# Patient Record
Sex: Male | Born: 1965 | Race: White | Hispanic: No | Marital: Married | Smoking: Never smoker
Health system: Southern US, Community
[De-identification: ages and names within clinical notes are randomized; demographics above are authoritative.]

## PROBLEM LIST (undated history)

## (undated) DIAGNOSIS — K219 Gastro-esophageal reflux disease without esophagitis: Secondary | ICD-10-CM

## (undated) DIAGNOSIS — M79641 Pain in right hand: Secondary | ICD-10-CM

## (undated) DIAGNOSIS — G4733 Obstructive sleep apnea (adult) (pediatric): Secondary | ICD-10-CM

## (undated) DIAGNOSIS — Z8616 Personal history of COVID-19: Secondary | ICD-10-CM

## (undated) DIAGNOSIS — M79642 Pain in left hand: Secondary | ICD-10-CM

## (undated) DIAGNOSIS — E349 Endocrine disorder, unspecified: Secondary | ICD-10-CM

## (undated) DIAGNOSIS — J310 Chronic rhinitis: Secondary | ICD-10-CM

## (undated) DIAGNOSIS — R9389 Abnormal findings on diagnostic imaging of other specified body structures: Secondary | ICD-10-CM

## (undated) DIAGNOSIS — G629 Polyneuropathy, unspecified: Secondary | ICD-10-CM

## (undated) DIAGNOSIS — Q263 Partial anomalous pulmonary venous connection: Secondary | ICD-10-CM

## (undated) DIAGNOSIS — E782 Mixed hyperlipidemia: Secondary | ICD-10-CM

## (undated) HISTORY — DX: Mixed hyperlipidemia: E78.2

## (undated) HISTORY — DX: Polyneuropathy, unspecified: G62.9

## (undated) HISTORY — DX: Pain in left hand: M79.641

## (undated) HISTORY — DX: Pain in right hand: M79.642

## (undated) HISTORY — DX: Obstructive sleep apnea (adult) (pediatric): G47.33

## (undated) HISTORY — PX: HERNIA REPAIR: SHX51

## (undated) HISTORY — DX: Chronic rhinitis: J31.0

## (undated) HISTORY — DX: Partial anomalous pulmonary venous connection: Q26.3

## (undated) HISTORY — DX: Personal history of COVID-19: Z86.16

## (undated) HISTORY — DX: Endocrine disorder, unspecified: E34.9

---

## 2019-08-23 ENCOUNTER — Other Ambulatory Visit: Payer: Self-pay

## 2019-08-23 ENCOUNTER — Encounter (HOSPITAL_BASED_OUTPATIENT_CLINIC_OR_DEPARTMENT_OTHER): Payer: Self-pay | Admitting: *Deleted

## 2019-08-23 ENCOUNTER — Emergency Department (HOSPITAL_COMMUNITY): Admission: EM | Admit: 2019-08-23 | Discharge: 2019-08-23 | Discharge status: Against Medical Advice | Payer: Self-pay

## 2019-08-23 DIAGNOSIS — U071 COVID-19: Secondary | ICD-10-CM | POA: Diagnosis not present

## 2019-08-23 DIAGNOSIS — F410 Panic disorder [episodic paroxysmal anxiety] without agoraphobia: Secondary | ICD-10-CM | POA: Diagnosis present

## 2019-08-23 DIAGNOSIS — J9601 Acute respiratory failure with hypoxia: Secondary | ICD-10-CM | POA: Diagnosis present

## 2019-08-23 DIAGNOSIS — K219 Gastro-esophageal reflux disease without esophagitis: Secondary | ICD-10-CM | POA: Diagnosis present

## 2019-08-23 DIAGNOSIS — Z801 Family history of malignant neoplasm of trachea, bronchus and lung: Secondary | ICD-10-CM

## 2019-08-23 DIAGNOSIS — J1282 Pneumonia due to coronavirus disease 2019: Secondary | ICD-10-CM | POA: Diagnosis present

## 2019-08-23 DIAGNOSIS — R1110 Vomiting, unspecified: Secondary | ICD-10-CM | POA: Diagnosis present

## 2019-08-23 DIAGNOSIS — E222 Syndrome of inappropriate secretion of antidiuretic hormone: Secondary | ICD-10-CM | POA: Diagnosis present

## 2019-08-23 NOTE — ED Triage Notes (Addendum)
Pt reports he tested COVID + on Sunday. Pt states since Friday night he has had a fever, vomiting, and cough since Friday night. Taking prednisone and zpack. Pt says he went to UC in [place] and he has bilateral PNA and his O2 level was 88% on RA.

## 2019-08-24 ENCOUNTER — Inpatient Hospital Stay (HOSPITAL_BASED_OUTPATIENT_CLINIC_OR_DEPARTMENT_OTHER)
Admission: EM | Admit: 2019-08-24 | Discharge: 2019-09-09 | DRG: 177 | Disposition: A | Discharge status: Home / Self Care | Payer: 59 | Attending: Internal Medicine | Admitting: Internal Medicine

## 2019-08-24 ENCOUNTER — Emergency Department (HOSPITAL_BASED_OUTPATIENT_CLINIC_OR_DEPARTMENT_OTHER): Payer: 59

## 2019-08-24 DIAGNOSIS — R748 Abnormal levels of other serum enzymes: Secondary | ICD-10-CM

## 2019-08-24 DIAGNOSIS — R0902 Hypoxemia: Secondary | ICD-10-CM

## 2019-08-24 DIAGNOSIS — J069 Acute upper respiratory infection, unspecified: Secondary | ICD-10-CM

## 2019-08-24 DIAGNOSIS — J1282 Pneumonia due to coronavirus disease 2019: Secondary | ICD-10-CM

## 2019-08-24 DIAGNOSIS — R0600 Dyspnea, unspecified: Secondary | ICD-10-CM

## 2019-08-24 DIAGNOSIS — R0602 Shortness of breath: Secondary | ICD-10-CM

## 2019-08-24 HISTORY — DX: Acute upper respiratory infection, unspecified: J06.9

## 2019-08-24 LAB — CBC WITH DIFFERENTIAL/PLATELET
Abs Immature Granulocytes: 0.02 10*3/uL (ref 0.00–0.07)
Basophils Absolute: 0 10*3/uL (ref 0.0–0.1)
Basophils Relative: 0 %
Eosinophils Absolute: 0 10*3/uL (ref 0.0–0.5)
Eosinophils Relative: 0 %
HCT: 45.3 % (ref 39.0–52.0)
Hemoglobin: 15.1 g/dL (ref 13.0–17.0)
Immature Granulocytes: 0 %
Lymphocytes Relative: 8 %
Lymphs Abs: 0.5 10*3/uL — ABNORMAL LOW (ref 0.7–4.0)
MCH: 31.9 pg (ref 26.0–34.0)
MCHC: 33.3 g/dL (ref 30.0–36.0)
MCV: 95.8 fL (ref 80.0–100.0)
Monocytes Absolute: 0.2 10*3/uL (ref 0.1–1.0)
Monocytes Relative: 3 %
Neutro Abs: 5.1 10*3/uL (ref 1.7–7.7)
Neutrophils Relative %: 89 %
Platelets: 151 10*3/uL (ref 150–400)
RBC: 4.73 MIL/uL (ref 4.22–5.81)
RDW: 12.4 % (ref 11.5–15.5)
WBC: 5.8 10*3/uL (ref 4.0–10.5)
nRBC: 0 % (ref 0.0–0.2)

## 2019-08-24 LAB — BASIC METABOLIC PANEL
Anion gap: 11 (ref 5–15)
BUN: 11 mg/dL (ref 6–20)
CO2: 26 mmol/L (ref 22–32)
Calcium: 8.4 mg/dL — ABNORMAL LOW (ref 8.9–10.3)
Chloride: 98 mmol/L (ref 98–111)
Creatinine, Ser: 1.25 mg/dL — ABNORMAL HIGH (ref 0.61–1.24)
GFR calc Af Amer: >60 mL/min (ref >60)
GFR calc non Af Amer: >60 mL/min (ref >60)
Glucose, Bld: 121 mg/dL — ABNORMAL HIGH (ref 70–99)
Potassium: 4 mmol/L (ref 3.5–5.1)
Sodium: 135 mmol/L (ref 135–145)

## 2019-08-24 LAB — SARS CORONAVIRUS 2 BY RT PCR (HOSPITAL ORDER, PERFORMED IN ~~LOC~~ HOSPITAL LAB): SARS Coronavirus 2: POSITIVE — AB

## 2019-08-24 MED ORDER — ACETAMINOPHEN 325 MG PO TABS
650.0000 mg | ORAL_TABLET | Freq: Once | ORAL | Status: AC | PRN
  Administered 2019-08-24: 650 mg via ORAL
  Filled 2019-08-24: qty 2

## 2019-08-24 MED ORDER — KETOROLAC TROMETHAMINE 30 MG/ML IJ SOLN
30.0000 mg | Freq: Once | INTRAMUSCULAR | Status: DC
  Filled 2019-08-24: qty 1

## 2019-08-24 MED ORDER — SODIUM CHLORIDE 0.9 % IV SOLN
100.0000 mg | Freq: Every day | INTRAVENOUS | Status: AC
  Administered 2019-08-25 – 2019-08-28 (×4): 100 mg via INTRAVENOUS
  Filled 2019-08-24 (×4): qty 20

## 2019-08-24 MED ORDER — DEXAMETHASONE SODIUM PHOSPHATE 10 MG/ML IJ SOLN
10.0000 mg | Freq: Once | INTRAMUSCULAR | Status: AC
  Administered 2019-08-24: 10 mg via INTRAVENOUS
  Filled 2019-08-24: qty 1

## 2019-08-24 MED ORDER — SODIUM CHLORIDE 0.9 % IV SOLN
100.0000 mg | INTRAVENOUS | Status: AC
  Administered 2019-08-24 (×2): 100 mg via INTRAVENOUS
  Filled 2019-08-24 (×2): qty 20

## 2019-08-24 MED ORDER — ACETAMINOPHEN 325 MG PO TABS
650.0000 mg | ORAL_TABLET | Freq: Four times a day (QID) | ORAL | Status: DC | PRN
  Administered 2019-08-25 – 2019-09-01 (×7): 650 mg via ORAL
  Filled 2019-08-24 (×9): qty 2

## 2019-08-24 MED ORDER — KETOROLAC TROMETHAMINE 30 MG/ML IJ SOLN
15.0000 mg | Freq: Once | INTRAMUSCULAR | Status: AC
  Administered 2019-08-24: 15 mg via INTRAVENOUS

## 2019-08-24 NOTE — ED Provider Notes (Signed)
[facility] Provider Note: [name], MD, FACEP  CSN: [id number] MRN: [id number] ARRIVAL: 08/23/19 at 2339 ROOM: MH08/MH08   CHIEF COMPLAINT  Cough   HISTORY OF PRESENT ILLNESS  08/24/19 2:55 AM Samuel [name] is a 54 y.o. male with a one week history of persistent fever, body aches, cough, posttussive emesis and shortness of breath.  He tested positive for Covid 2 days later.  He has been taking Tessalon Perles, prednisone and azithromycin.  Despite these his symptoms worsened yesterday and shortness of breath has become moderate.  It is worse with exertion.  He is developed a headache and abdominal pain which he attributes to the force of his persistent coughing.  He has had nasal congestion, sore throat, and loss of taste and smell.  He was seen at an urgent care yesterday and was noted to have bilateral pneumonia and an oxygen saturation of 88%.  His temperature on arrival here was 101.1 and he was given acetaminophen.   History reviewed. No pertinent past medical history.  Past Surgical History:  Procedure Laterality Date  . HERNIA REPAIR      No family history on file.  Social History   Tobacco Use  . Smoking status: Never Smoker  Substance Use Topics  . Alcohol use: Not Currently  . Drug use: Not Currently    Prior to Admission medications   Not on File    Allergies Patient has no known allergies.   REVIEW OF SYSTEMS  Negative except as noted here or in the History of Present Illness.   PHYSICAL EXAMINATION  Initial Vital Signs Blood pressure (!) 132/92, pulse 92, temperature (!) 101.1 F (38.4 C), temperature source Oral, resp. rate 16, SpO2 93 %.  Examination General: Well-developed, well-nourished male in no acute distress; appearance consistent with age of record HENT: normocephalic; atraumatic Eyes: pupils equal, round and reactive to light; extraocular muscles intact Neck: supple Heart: regular rate and rhythm Lungs:  Faint rales in bases; shallow breaths with coughing on deep breathing Abdomen: soft; nondistended; mild diffuse tenderness; bowel sounds present Extremities: No deformity; full range of motion; pulses normal Neurologic: Awake, alert and oriented; motor function intact in all extremities and symmetric; no facial droop Skin: Warm and dry Psychiatric: Normal mood and affect   RESULTS  Summary of this visit's results, reviewed and interpreted by myself:   EKG Interpretation  Date/Time:    Ventricular Rate:    PR Interval:    QRS Duration:   QT Interval:    QTC Calculation:   R Axis:     Text Interpretation:        Laboratory Studies: Results for orders placed or performed during the hospital encounter of 08/24/19 (from the past 24 hour(s))  SARS Coronavirus 2 by RT PCR (hospital order, performed in [facility] hospital lab) Nasopharyngeal Nasopharyngeal Swab     Status: Abnormal   Collection Time: 08/24/19 12:10 AM   Specimen: Nasopharyngeal Swab  Result Value Ref Range   SARS Coronavirus 2 POSITIVE (A) NEGATIVE  CBC with Differential/Platelet     Status: Abnormal   Collection Time: 08/24/19  3:16 AM  Result Value Ref Range   WBC 5.8 4.0 - 10.5 K/uL   RBC 4.73 4.22 - 5.81 MIL/uL   Hemoglobin 15.1 13.0 - 17.0 g/dL   HCT 69.4 39 - 52 %   MCV 95.8 80.0 - 100.0 fL   MCH 31.9 26.0 - 34.0 pg   MCHC 33.3 30.0 - 36.0 g/dL  RDW 12.4 11.5 - 15.5 %   Platelets 151 150 - 400 K/uL   nRBC 0.0 0.0 - 0.2 %   Neutrophils Relative % 89 %   Neutro Abs 5.1 1.7 - 7.7 K/uL   Lymphocytes Relative 8 %   Lymphs Abs 0.5 (L) 0.7 - 4.0 K/uL   Monocytes Relative 3 %   Monocytes Absolute 0.2 0 - 1 K/uL   Eosinophils Relative 0 %   Eosinophils Absolute 0.0 0 - 0 K/uL   Basophils Relative 0 %   Basophils Absolute 0.0 0 - 0 K/uL   Immature Granulocytes 0 %   Abs Immature Granulocytes 0.02 0.00 - 0.07 K/uL  Basic metabolic panel     Status: Abnormal   Collection Time: 08/24/19  3:16 AM  Result  Value Ref Range   Sodium 135 135 - 145 mmol/L   Potassium 4.0 3.5 - 5.1 mmol/L   Chloride 98 98 - 111 mmol/L   CO2 26 22 - 32 mmol/L   Glucose, Bld 121 (H) 70 - 99 mg/dL   BUN 11 6 - 20 mg/dL   Creatinine, Ser 9.93 (H) 0.61 - 1.24 mg/dL   Calcium 8.4 (L) 8.9 - 10.3 mg/dL   GFR calc non Af Amer >60 >60 mL/min   GFR calc Af Amer >60 >60 mL/min   Anion gap 11 5 - 15   Imaging Studies: DG Chest Portable 1 View  Result Date: 08/24/2019 CLINICAL DATA:  Cough fever COVID positive EXAM: PORTABLE CHEST 1 VIEW COMPARISON:  None. FINDINGS: Streaky bilateral pulmonary opacities. Normal heart size. No pneumothorax or pleural effusion IMPRESSION: Streaky bilateral pulmonary opacities suspicious for bilateral pneumonia. Electronically Signed   By: Jasmine Pang M.D.   On: 08/24/2019 00:41    ED COURSE and MDM  Nursing notes, initial and subsequent vitals signs, including pulse oximetry, reviewed and interpreted by myself.  Vitals:   08/23/19 2352 08/23/19 2358 08/24/19 0152 08/24/19 0321  BP: (!) 142/106  (!) 132/92   Pulse: (!) 108  92   Resp: (!) 24  16   Temp: (!) 101.5 F (38.6 C)  (!) 101.1 F (38.4 C) (!) 100.9 F (38.3 C)  TempSrc: Oral  Oral Oral  SpO2: 90% 96% 93%    Medications  dexamethasone (DECADRON) injection 10 mg (has no administration in time range)  remdesivir 100 mg in sodium chloride 0.9 % 100 mL IVPB (has no administration in time range)  remdesivir 100 mg in sodium chloride 0.9 % 100 mL IVPB (has no administration in time range)  acetaminophen (TYLENOL) tablet 650 mg (650 mg Oral Given 08/24/19 0015)   3:42 AM Patient started on dexamethasone at remdesivir IV.  Oxygen saturation is currently 91% on room air.  4:10 AM Dr. Antionette Char to admit to hospitalist service.   PROCEDURES  Procedures   ED DIAGNOSES     ICD-10-CM   1. Pneumonia due to COVID-19 virus  U07.1    J12.82   2. Hypoxia  R09.02        Paula Libra, MD 08/24/19 [phone]

## 2019-08-25 ENCOUNTER — Encounter (HOSPITAL_COMMUNITY): Payer: Self-pay | Admitting: Internal Medicine

## 2019-08-25 DIAGNOSIS — J069 Acute upper respiratory infection, unspecified: Secondary | ICD-10-CM | POA: Diagnosis not present

## 2019-08-25 DIAGNOSIS — J1282 Pneumonia due to coronavirus disease 2019: Secondary | ICD-10-CM | POA: Diagnosis present

## 2019-08-25 DIAGNOSIS — J9601 Acute respiratory failure with hypoxia: Secondary | ICD-10-CM | POA: Diagnosis present

## 2019-08-25 DIAGNOSIS — U071 COVID-19: Secondary | ICD-10-CM | POA: Diagnosis present

## 2019-08-25 DIAGNOSIS — E222 Syndrome of inappropriate secretion of antidiuretic hormone: Secondary | ICD-10-CM | POA: Diagnosis present

## 2019-08-25 DIAGNOSIS — R748 Abnormal levels of other serum enzymes: Secondary | ICD-10-CM | POA: Diagnosis not present

## 2019-08-25 DIAGNOSIS — F410 Panic disorder [episodic paroxysmal anxiety] without agoraphobia: Secondary | ICD-10-CM | POA: Diagnosis present

## 2019-08-25 DIAGNOSIS — R1110 Vomiting, unspecified: Secondary | ICD-10-CM | POA: Diagnosis present

## 2019-08-25 DIAGNOSIS — Z801 Family history of malignant neoplasm of trachea, bronchus and lung: Secondary | ICD-10-CM | POA: Diagnosis not present

## 2019-08-25 DIAGNOSIS — K219 Gastro-esophageal reflux disease without esophagitis: Secondary | ICD-10-CM | POA: Diagnosis present

## 2019-08-25 DIAGNOSIS — I5031 Acute diastolic (congestive) heart failure: Secondary | ICD-10-CM | POA: Diagnosis not present

## 2019-08-25 LAB — COMPREHENSIVE METABOLIC PANEL
ALT: 22 U/L (ref 0–44)
AST: 33 U/L (ref 15–41)
Albumin: 3 g/dL — ABNORMAL LOW (ref 3.5–5.0)
Alkaline Phosphatase: 59 U/L (ref 38–126)
Anion gap: 10 (ref 5–15)
BUN: 16 mg/dL (ref 6–20)
CO2: 28 mmol/L (ref 22–32)
Calcium: 8.8 mg/dL — ABNORMAL LOW (ref 8.9–10.3)
Chloride: 98 mmol/L (ref 98–111)
Creatinine, Ser: 1.21 mg/dL (ref 0.61–1.24)
GFR calc Af Amer: >60 mL/min (ref >60)
GFR calc non Af Amer: >60 mL/min (ref >60)
Glucose, Bld: 109 mg/dL — ABNORMAL HIGH (ref 70–99)
Potassium: 4.1 mmol/L (ref 3.5–5.1)
Sodium: 136 mmol/L (ref 135–145)
Total Bilirubin: 0.5 mg/dL (ref 0.3–1.2)
Total Protein: 6.6 g/dL (ref 6.5–8.1)

## 2019-08-25 LAB — CBC WITH DIFFERENTIAL/PLATELET
Abs Immature Granulocytes: 0.04 10*3/uL (ref 0.00–0.07)
Basophils Absolute: 0 10*3/uL (ref 0.0–0.1)
Basophils Relative: 0 %
Eosinophils Absolute: 0 10*3/uL (ref 0.0–0.5)
Eosinophils Relative: 0 %
HCT: 44.2 % (ref 39.0–52.0)
Hemoglobin: 15 g/dL (ref 13.0–17.0)
Immature Granulocytes: 1 %
Lymphocytes Relative: 12 %
Lymphs Abs: 0.8 10*3/uL (ref 0.7–4.0)
MCH: 32.2 pg (ref 26.0–34.0)
MCHC: 33.9 g/dL (ref 30.0–36.0)
MCV: 94.8 fL (ref 80.0–100.0)
Monocytes Absolute: 0.4 10*3/uL (ref 0.1–1.0)
Monocytes Relative: 5 %
Neutro Abs: 5.8 10*3/uL (ref 1.7–7.7)
Neutrophils Relative %: 82 %
Platelets: 186 10*3/uL (ref 150–400)
RBC: 4.66 MIL/uL (ref 4.22–5.81)
RDW: 12.5 % (ref 11.5–15.5)
WBC: 7.1 10*3/uL (ref 4.0–10.5)
nRBC: 0 % (ref 0.0–0.2)

## 2019-08-25 LAB — HIV ANTIBODY (ROUTINE TESTING W REFLEX): HIV Screen 4th Generation wRfx: NONREACTIVE

## 2019-08-25 LAB — C-REACTIVE PROTEIN: CRP: 10.7 mg/dL — ABNORMAL HIGH (ref <1.0)

## 2019-08-25 LAB — PROCALCITONIN: Procalcitonin: <0.1 ng/mL

## 2019-08-25 LAB — TROPONIN I (HIGH SENSITIVITY): Troponin I (High Sensitivity): 7 ng/L (ref <18)

## 2019-08-25 LAB — ABO/RH: ABO/RH(D): A POS

## 2019-08-25 LAB — D-DIMER, QUANTITATIVE: D-Dimer, Quant: 0.9 ug/mL-FEU — ABNORMAL HIGH (ref 0.00–0.50)

## 2019-08-25 MED ORDER — PROSOURCE PLUS PO LIQD
30.0000 mL | Freq: Three times a day (TID) | ORAL | Status: DC
  Administered 2019-08-25 – 2019-09-09 (×43): 30 mL via ORAL
  Filled 2019-08-25 (×51): qty 30

## 2019-08-25 MED ORDER — METHYLPREDNISOLONE SODIUM SUCC 125 MG IJ SOLR
50.0000 mg | Freq: Two times a day (BID) | INTRAMUSCULAR | Status: DC
  Administered 2019-08-25 – 2019-08-27 (×5): 50 mg via INTRAVENOUS
  Filled 2019-08-25 (×6): qty 2

## 2019-08-25 MED ORDER — ONDANSETRON HCL 4 MG/2ML IJ SOLN: 4.0000 mg | Freq: Four times a day (QID) | INTRAMUSCULAR | Status: DC | PRN

## 2019-08-25 MED ORDER — ENSURE ENLIVE PO LIQD
237.0000 mL | Freq: Two times a day (BID) | ORAL | Status: DC
  Administered 2019-08-25 (×2): 237 mL via ORAL

## 2019-08-25 MED ORDER — ONDANSETRON HCL 4 MG PO TABS: 4.0000 mg | ORAL_TABLET | Freq: Four times a day (QID) | ORAL | Status: DC | PRN

## 2019-08-25 MED ORDER — ENSURE ENLIVE PO LIQD
237.0000 mL | Freq: Three times a day (TID) | ORAL | Status: DC
  Administered 2019-08-25 – 2019-08-27 (×2): 237 mL via ORAL

## 2019-08-25 MED ORDER — HYDROCOD POLST-CPM POLST ER 10-8 MG/5ML PO SUER
5.0000 mL | Freq: Once | ORAL | Status: AC
  Administered 2019-08-25: 5 mL via ORAL
  Filled 2019-08-25: qty 5

## 2019-08-25 MED ORDER — ADULT MULTIVITAMIN W/MINERALS CH
1.0000 | ORAL_TABLET | Freq: Every day | ORAL | Status: DC
  Administered 2019-08-25 – 2019-09-09 (×16): 1 via ORAL
  Filled 2019-08-25 (×16): qty 1

## 2019-08-25 MED ORDER — TOCILIZUMAB 400 MG/20ML IV SOLN
800.0000 mg | Freq: Once | INTRAVENOUS | Status: AC
  Administered 2019-08-25: 800 mg via INTRAVENOUS
  Filled 2019-08-25: qty 40

## 2019-08-25 MED ORDER — PANTOPRAZOLE SODIUM 40 MG PO TBEC
40.0000 mg | DELAYED_RELEASE_TABLET | Freq: Every day | ORAL | Status: DC
  Administered 2019-08-25 – 2019-09-09 (×16): 40 mg via ORAL
  Filled 2019-08-25 (×17): qty 1

## 2019-08-25 MED ORDER — HYDROCOD POLST-CPM POLST ER 10-8 MG/5ML PO SUER
5.0000 mL | Freq: Two times a day (BID) | ORAL | Status: DC | PRN
  Administered 2019-08-27 – 2019-09-01 (×4): 5 mL via ORAL
  Filled 2019-08-25 (×4): qty 5

## 2019-08-25 MED ORDER — ENOXAPARIN SODIUM 40 MG/0.4ML ~~LOC~~ SOLN
40.0000 mg | Freq: Every day | SUBCUTANEOUS | Status: DC
  Administered 2019-08-25 – 2019-09-08 (×15): 40 mg via SUBCUTANEOUS
  Filled 2019-08-25 (×17): qty 0.4

## 2019-08-25 NOTE — Progress Notes (Signed)
Patient up from bed and siting in the chair for meals, oxygen saturation 93% on room air, no complaints of SOB or discomfort.

## 2019-08-25 NOTE — Progress Notes (Signed)
PROGRESS NOTE                                                                                                                                                                                                             Patient Demographics:    Samuel [name], is a 54 y.o. male, DOB - Sep 07, 1965, KCM:[id number]  Outpatient Primary MD for the patient is Patient, [name]    LOS - 0  Admit date - 08/24/2019    Chief Complaint  Patient presents with  . Cough       Brief Narrative Samuel [name] is a 55 y.o. male with no significant past medical history presents to the ER at bedside at [facility] with complaint of shortness of breath.  Patient started having upper respiratory tract-like symptoms and headache about a week ago and was diagnosed with COVID-19 infection.  He continued to get progressively short of breath and presented to the ER with severe hypoxia requiring 12 L of oxygen and was admitted to the hospital subsequently.   Subjective:    Samuel [name] today has, No headache, No chest pain, No abdominal pain - No Nausea, No new weakness tingling or numbness, improved shortness of breath.   Assessment  & Plan :     1. Acute Hypoxic Resp. Failure due to Acute Covid 19 Viral Pneumonitis during the ongoing 2020 Covid 19 Pandemic - he has severe disease and is unfortunately unvaccinated.  He was promptly placed on steroids, remdesivir and received Actemra on the day of admission.  Has shown good stabilization and clinical improvement.  Continue to monitor closely.  Encouraged the patient to sit up in chair in the daytime use I-S and flutter valve for pulmonary toiletry and then prone in bed when at night.  Will advance activity and titrate down oxygen as possible.   SpO2: 91 % O2 Flow Rate (L/min): 5 L/min  Recent Labs  Lab 08/24/19 0010 08/24/19 0316 08/25/19 0337  WBC  --  5.8 7.1  PLT  --  151 186    CRP  --   --  10.7*  DDIMER  --   --  0.90*  PROCALCITON  --   --  <0.10  SARSCOV2NAA POSITIVE*  --   --     Hepatic Function Latest Ref Rng & Units 08/25/2019  Total Protein 6.5 - 8.1 g/dL 6.6  Albumin 3.5 - 5.0 g/dL 3.0(L)  AST 15 - 41 U/L 33  ALT 0 - 44 U/L 22  Alk Phosphatase 38 - 126 U/L 59  Total Bilirubin 0.3 - 1.2 mg/dL 0.5      GERD - PPI      Condition - Extremely Guarded  Family Communication  :  None  Code Status :  Full  Consults  :  None  Procedures  :  None  PUD Prophylaxis : PPI  Disposition Plan  :    Status is: Inpatient  Remains inpatient appropriate because:IV treatments appropriate due to intensity of illness or inability to take PO   Dispo: The patient is from: Home              Anticipated d/c is to: Home              Anticipated d/c date is: > 3 days              Patient currently is not medically stable to d/c.   DVT Prophylaxis  :  Lovenox   Lab Results  Component Value Date   PLT 186 08/25/2019    Diet :  Diet Order            Diet Heart Room service appropriate? Yes; Fluid consistency: Thin  Diet effective now                  Inpatient Medications  Scheduled Meds: . enoxaparin (LOVENOX) injection  40 mg Subcutaneous Daily  . feeding supplement (ENSURE ENLIVE)  237 mL Oral BID BM  . methylPREDNISolone (SOLU-MEDROL) injection  50 mg Intravenous BID  . pantoprazole  40 mg Oral Daily   Continuous Infusions: . remdesivir 100 mg in NS 100 mL 100 mg (08/25/19 0940)   PRN Meds:.acetaminophen, chlorpheniramine-HYDROcodone, ondansetron **OR** ondansetron (ZOFRAN) IV  Antibiotics  :    Anti-infectives (From admission, onward)   Start     Dose/Rate Route Frequency Ordered Stop   08/25/19 1000  remdesivir 100 mg in sodium chloride 0.9 % 100 mL IVPB     Discontinue     100 mg 200 mL/hr over 30 Minutes Intravenous Daily 08/24/19 0357 08/29/19 0959   08/24/19 0400  remdesivir 100 mg in sodium chloride 0.9 % 100 mL IVPB         100 mg 200 mL/hr over 30 Minutes Intravenous Every 30 min 08/24/19 0357 08/24/19 0623       Time Spent in minutes  30   Lala Lund M.D on 08/25/2019 at 10:49 AM  To page go to [email or web address] - password [email or web address]  [facility] -  Office  [phone]   See all Orders from today for further details    Objective:   Vitals:   08/25/19 0444 08/25/19 0700 08/25/19 0759 08/25/19 0826  BP: 135/90 117/90 (!) 111/93   Pulse: 92 76 84 84  Resp: 19 (!) 26 (!) 22 20  Temp: 99.1 F (37.3 C) 98.7 F (37.1 C) 98.3 F (36.8 C)   TempSrc: Oral Oral Oral   SpO2: 90% 90% 92% 91%  Weight:      Height:        Wt Readings from Last 3 Encounters:  08/25/19 92.1 kg     Intake/Output Summary (Last 24 hours) at 08/25/2019 1049 Last data filed at 08/25/2019 0100 Gross per 24 hour  Intake 120 ml  Output 0 ml  Net 120 ml  Physical Exam  Awake Alert, No new F.N deficits, Normal affect [place].AT,PERRAL Supple Neck,No JVD, No cervical lymphadenopathy appriciated.  Symmetrical Chest wall movement, Good air movement bilaterally, CTAB RRR,No Gallops,Rubs or new Murmurs, No Parasternal Heave +ve B.Sounds, Abd Soft, No tenderness, No organomegaly appriciated, No rebound - guarding or rigidity. No Cyanosis, Clubbing or edema, No new Rash or bruise      Data Review:    CBC Recent Labs  Lab 08/24/19 0316 08/25/19 0337  WBC 5.8 7.1  HGB 15.1 15.0  HCT 45.3 44.2  PLT 151 186  MCV 95.8 94.8  MCH 31.9 32.2  MCHC 33.3 33.9  RDW 12.4 12.5  LYMPHSABS 0.5* 0.8  MONOABS 0.2 0.4  EOSABS 0.0 0.0  BASOSABS 0.0 0.0    Chemistries  Recent Labs  Lab 08/24/19 0316 08/25/19 0337  NA 135 136  K 4.0 4.1  CL 98 98  CO2 26 28  GLUCOSE 121* 109*  BUN 11 16  CREATININE 1.25* 1.21  CALCIUM 8.4* 8.8*  AST  --  33  ALT  --  22  ALKPHOS  --  59  BILITOT  --  0.5      ------------------------------------------------------------------------------------------------------------------ No results for input(s): CHOL, HDL, LDLCALC, TRIG, CHOLHDL, LDLDIRECT in the last 72 hours.  No results found for: HGBA1C ------------------------------------------------------------------------------------------------------------------ No results for input(s): TSH, T4TOTAL, T3FREE, THYROIDAB in the last 72 hours.  Invalid input(s): FREET3  Cardiac Enzymes No results for input(s): CKMB, TROPONINI, MYOGLOBIN in the last 168 hours.  Invalid input(s): CK ------------------------------------------------------------------------------------------------------------------ No results found for: BNP  Micro Results Recent Results (from the past 240 hour(s))  SARS Coronavirus 2 by RT PCR (hospital order, performed in [facility] hospital lab) Nasopharyngeal Nasopharyngeal Swab     Status: Abnormal   Collection Time: 08/24/19 12:10 AM   Specimen: Nasopharyngeal Swab  Result Value Ref Range Status   SARS Coronavirus 2 POSITIVE (A) NEGATIVE Final    Comment: RESULT CALLED TO, READ BACK BY AND VERIFIED WITH: ADKINS,L AT 0105 ON [id number] BY CHERESNOWSKY,T (NOTE) SARS-CoV-2 target nucleic acids are DETECTED  SARS-CoV-2 RNA is generally detectable in upper respiratory specimens  during the acute phase of infection.  Positive results are indicative  of the presence of the identified virus, but do not rule out bacterial infection or co-infection with other pathogens not detected by the test.  Clinical correlation with patient history and  other diagnostic information is necessary to determine patient infection status.  The expected result is negative.  Fact Sheet for Patients:   StrictlyIdeas.no   Fact Sheet for Healthcare Providers:   BankingDealers.co.za    This test is not yet approved or cleared by the [place] FDA and  has  been authorized for detection and/or diagnosis of SARS-CoV-2 by FDA under an Emergency Use Authorization (EUA).  This EUA will remain in effect (meani ng this test can be used) for the duration of  the COVID-19 declaration under Section 564(b)(1) of the Act, 21 U.S.C. section 360-bbb-3(b)(1), unless the authorization is terminated or revoked sooner.  Performed at [facility], [address] [place], [place] [address]     Radiology Reports DG Chest Portable 1 View  Result Date: 08/24/2019 CLINICAL DATA:  Cough fever COVID positive EXAM: PORTABLE CHEST 1 VIEW COMPARISON:  None. FINDINGS: Streaky bilateral pulmonary opacities. Normal heart size. No pneumothorax or pleural effusion IMPRESSION: Streaky bilateral pulmonary opacities suspicious for bilateral pneumonia. Electronically Signed   By: Donavan Foil M.D.   On:  08/24/2019 00:41     

## 2019-08-25 NOTE — H&P (Signed)
History and Physical    Samuel [name] VZD:[id number] DOB: [birth year] DOA: 08/24/2019  PCP: Patient, [name]  Patient coming from: Home.  Chief Complaint: Shortness of breath.  HPI: Samuel [name] is a 54 y.o. male with no significant past medical history presents to the ER at bedside at [facility] with complaint of shortness of breath.  Patient started having upper respiratory tract-like symptoms and headache about a week ago and was diagnosed with COVID-19 infection.  Patient was treating himself symptomatically.  Patient became progressively short of breath and patient presents to the ER admits in [facility] on August 12.  ED Course: In the ER patient is found to be hypoxic with chest x-ray showing bilateral infiltrates.  Patient was started on remdesivir Decadron admitted for further management of COVID-19 pneumonia.  At the time of my exam patient is on 12 L oxygen.  Review of Systems: As per HPI, rest all negative.   History reviewed. No pertinent past medical history.  Past Surgical History:  Procedure Laterality Date  . HERNIA REPAIR       reports that he has never smoked. He has never used smokeless tobacco. He reports previous alcohol use. He reports previous drug use.  No Known Allergies  Family History  Problem Relation Age of Onset  . Lung cancer Father     Prior to Admission medications   Medication Sig Start Date End Date Taking? Authorizing Provider  azithromycin (ZITHROMAX) 250 MG tablet Take by mouth. 08/20/19   [provider]  benzonatate (TESSALON) 200 MG capsule Take 200 mg by mouth 3 (three) times daily as needed. 08/20/19   [provider]  methylPREDNISolone (MEDROL DOSEPAK) 4 MG TBPK tablet Take by mouth as directed. 08/20/19   [provider]    Physical Exam: Constitutional: Moderately built and nourished. Vitals:   08/25/19 0000 08/25/19 0112 08/25/19 0135 08/25/19 0136  BP: (!) 143/97 (!) 134/92      Pulse: 92 85 79 83  Resp: 16 (!) 30 20 (!) 29  Temp: 98.9 F (37.2 C)     TempSrc: Oral     SpO2: 93% (!) 89% 93% 92%   Eyes: Anicteric no pallor. ENMT: No discharge from the ears eyes nose or mouth. Neck: No mass felt.  No neck rigidity. Respiratory: No rhonchi or crepitations. Cardiovascular: S1-S2 heard. Abdomen: Soft nontender bowel sounds present. Musculoskeletal: No edema. Skin: No rash. Neurologic: Alert awake oriented to time place and person.  Moves all extremities. Psychiatric: Appears normal.  Normal affect.   Labs on Admission: I have personally reviewed following labs and imaging studies  CBC: Recent Labs  Lab 08/24/19 0316  WBC 5.8  NEUTROABS 5.1  HGB 15.1  HCT 45.3  MCV 95.8  PLT 151   Basic Metabolic Panel: Recent Labs  Lab 08/24/19 0316  NA 135  K 4.0  CL 98  CO2 26  GLUCOSE 121*  BUN 11  CREATININE 1.25*  CALCIUM 8.4*   GFR: CrCl cannot be calculated (Unknown ideal weight.). Liver Function Tests: No results for input(s): AST, ALT, ALKPHOS, BILITOT, PROT, ALBUMIN in the last 168 hours. No results for input(s): LIPASE, AMYLASE in the last 168 hours. No results for input(s): AMMONIA in the last 168 hours. Coagulation Profile: No results for input(s): INR, PROTIME in the last 168 hours. Cardiac Enzymes: No results for input(s): CKTOTAL, CKMB, CKMBINDEX, TROPONINI in the last 168 hours. BNP (last 3 results) No results for input(s): PROBNP in the  last 8760 hours. HbA1C: No results for input(s): HGBA1C in the last 72 hours. CBG: No results for input(s): GLUCAP in the last 168 hours. Lipid Profile: No results for input(s): CHOL, HDL, LDLCALC, TRIG, CHOLHDL, LDLDIRECT in the last 72 hours. Thyroid Function Tests: No results for input(s): TSH, T4TOTAL, FREET4, T3FREE, THYROIDAB in the last 72 hours. Anemia Panel: No results for input(s): VITAMINB12, FOLATE, FERRITIN, TIBC, IRON, RETICCTPCT in the last 72 hours. Urine analysis: No results  found for: COLORURINE, APPEARANCEUR, LABSPEC, PHURINE, GLUCOSEU, HGBUR, BILIRUBINUR, KETONESUR, PROTEINUR, UROBILINOGEN, NITRITE, LEUKOCYTESUR Sepsis Labs: @LABRCNTIP (procalcitonin:4,lacticidven:4) ) Recent Results (from the past 240 hour(s))  SARS Coronavirus 2 by RT PCR (hospital order, performed in [facility] hospital lab) Nasopharyngeal Nasopharyngeal Swab     Status: Abnormal   Collection Time: 08/24/19 12:10 AM   Specimen: Nasopharyngeal Swab  Result Value Ref Range Status   SARS Coronavirus 2 POSITIVE (A) NEGATIVE Final    Comment: RESULT CALLED TO, READ BACK BY AND VERIFIED WITH: ADKINS,L AT 0105 ON 08/26/19 BY CHERESNOWSKY,T (NOTE) SARS-CoV-2 target nucleic acids are DETECTED  SARS-CoV-2 RNA is generally detectable in upper respiratory specimens  during the acute phase of infection.  Positive results are indicative  of the presence of the identified virus, but do not rule out bacterial infection or co-infection with other pathogens not detected by the test.  Clinical correlation with patient history and  other diagnostic information is necessary to determine patient infection status.  The expected result is negative.  Fact Sheet for Patients:   818563   Fact Sheet for Healthcare Providers:   BoilerBrush.com.cy    This test is not yet approved or cleared by the https://pope.com/ FDA and  has been authorized for detection and/or diagnosis of SARS-CoV-2 by FDA under an Emergency Use Authorization (EUA).  This EUA will remain in effect (meani ng this test can be used) for the duration of  the COVID-19 declaration under Section 564(b)(1) of the Act, 21 U.S.C. section 360-bbb-3(b)(1), unless the authorization is terminated or revoked sooner.  Performed at [facility], [address] [place], [place]      Radiological Exams on Admission: DG Chest Portable 1 View  Result Date: 08/24/2019 CLINICAL  DATA:  Cough fever COVID positive EXAM: PORTABLE CHEST 1 VIEW COMPARISON:  None. FINDINGS: Streaky bilateral pulmonary opacities. Normal heart size. No pneumothorax or pleural effusion IMPRESSION: Streaky bilateral pulmonary opacities suspicious for bilateral pneumonia. Electronically Signed   By: 08/26/2019 M.D.   On: 08/24/2019 00:41     Assessment/Plan Principal Problem:   Acute respiratory disease due to COVID-19 virus    1. Acute respiratory failure with hypoxia secondary to COVID-19 pneumonia for which patient is started on IV steroids and remdesivir.  Since patient is requiring high flow oxygen at this time I discussed with patient about the off label use of Actemra side effects and contraindication patient is agreeable to use.  Follow inflammatory markers LFTs and oxygen requirement. 2. History of GERD on PPI.  Given that patient has acute respiratory failure with hypoxia with COVID-19 infection will need inpatient status.   DVT prophylaxis: Lovenox. Code Status: Full code. Family Communication: Discussed with patient. Disposition Plan: Home. Consults called: None. Admission status: Inpatient.   08/26/2019 MD [facility] Pager [phone].  If 7PM-7AM, please contact night-coverage [email or web address] Password [email or web address]  08/25/2019, 3:21 AM

## 2019-08-25 NOTE — Evaluation (Signed)
Physical Therapy Evaluation & Discharge Patient Details Name: Samuel [name] MRN: [id number] DOB: 07-18-1965 Today's Date: 08/25/2019   History of Present Illness  Pt is a 54 y.o. male admitted 08/24/19 with SOB with recent (+) COVID-19 dx (pt unvaccinated). Workup for acute hypoxic respiratory failure due to acute COVID viral pneumonitis. No pertinent PMH.    Clinical Impression  Patient evaluated by Physical Therapy with no further acute PT needs identified.PTA, pt independent, works and lives with wife who is Charity fundraiser recovering from [employer]. Today, pt independent with mobility and ADL tasks. SpO2 89-94% on RA with activity. Educ re: activity recommendations, energy conservation, incentive spirometer/flutter valve use, importance of continued mobility. All education has been completed and the patient has no further questions. Encouraged more frequent hallway ambulation during admission; pt independent with line management. Acute PT is signing off. Thank you for this referral.    Follow Up Recommendations No PT follow up    Equipment Recommendations  None recommended by PT    Recommendations for Other Services       Precautions / Restrictions Precautions Precautions: None Restrictions Weight Bearing Restrictions: No      Mobility  Bed Mobility               General bed mobility comments: Received sitting in recliner  Transfers Overall transfer level: Independent Equipment used: None                Ambulation/Gait Ambulation/Gait assistance: Independent Gait Distance (Feet): 500 Feet Assistive device: None Gait Pattern/deviations: WFL(Within Functional Limits)   Gait velocity interpretation: 1.31 - 2.62 ft/sec, indicative of limited community ambulator General Gait Details: Slow, steady gait independent without DME, pt able to manage lines independently. SpO2 89-94% on RA, HR 97  Stairs            Wheelchair Mobility    Modified Rankin (Stroke  Patients Only)       Balance Overall balance assessment: No apparent balance deficits (not formally assessed)                                           Pertinent Vitals/Pain Pain Assessment: No/denies pain    Home Living Family/patient expects to be discharged to:: Private residence Living Arrangements: Spouse/significant other Available Help at Discharge: Family;Available 24 hours/day Type of Home: House Home Access: Stairs to enter Entrance Stairs-Rails: None Entrance Stairs-Number of Steps: 4 Home Layout: Two level Home Equipment: Cane - single point Additional Comments: Wife is Charity fundraiser recovering from COVID (vaccinated)    Prior Function Level of Independence: Independent         Comments: Works (physically demanding job), drives     Higher education careers adviser        Extremity/Trunk Assessment   Upper Extremity Assessment Upper Extremity Assessment: Overall WFL for tasks assessed    Lower Extremity Assessment Lower Extremity Assessment: Overall WFL for tasks assessed    Cervical / Trunk Assessment Cervical / Trunk Assessment: Normal  Communication   Communication: No difficulties  Cognition Arousal/Alertness: (P) Awake/alert Behavior During Therapy: (P) WFL for tasks assessed/performed Overall Cognitive Status: (P) Within Functional Limits for tasks assessed                                        General Comments  Exercises Other Exercises Other Exercises: Incentive spirometer x5 (pulling ~1250 mL), flutter valve x5 (able to hold >5 sec) - educ to take these home and continue doing at least 1-2 wks   Assessment/Plan    PT Assessment Patent does not need any further PT services  PT Problem List         PT Treatment Interventions      PT Goals (Current goals can be found in the Care Plan section)  Acute Rehab PT Goals PT Goal Formulation: All assessment and education complete, DC therapy    Frequency     Barriers  to discharge        Co-evaluation               AM-PAC PT "6 Clicks" Mobility  Outcome Measure Help needed turning from your back to your side while in a flat bed without using bedrails?: None Help needed moving from lying on your back to sitting on the side of a flat bed without using bedrails?: None Help needed moving to and from a bed to a chair (including a wheelchair)?: None Help needed standing up from a chair using your arms (e.g., wheelchair or bedside chair)?: None Help needed to walk in hospital room?: None Help needed climbing 3-5 steps with a railing? : None 6 Click Score: 24    End of Session   Activity Tolerance: Patient tolerated treatment well Patient left: in chair;with call bell/phone within reach Nurse Communication: Mobility status PT Visit Diagnosis: Other abnormalities of gait and mobility (R26.89)    Time: 9379-0240 PT Time Calculation (min) (ACUTE ONLY): 24 min   Charges:   PT Evaluation $PT Eval Moderate Complexity: 1 Mod PT Treatments $Therapeutic Exercise: 8-22 mins      Ina Homes, PT, DPT Acute Rehabilitation Services  Pager [phone] Office [phone]  Malachy Chamber 08/25/2019, 3:39 PM

## 2019-08-25 NOTE — ED Notes (Signed)
Patient placed on 8L HFNC due to trending de-saturation. Patient also instructed on how to use a flutter valve. Patient demonstrated with positive oxygen results using a flutter valve. Patient oxygen saturations are 90-93% RR 33. Patient on monitor. Patient tolerated well.

## 2019-08-25 NOTE — Plan of Care (Signed)
Discussed with patient plan of care for the shift, pain management and respiratory HFNC with some teach back displayed

## 2019-08-25 NOTE — Progress Notes (Signed)
Initial Nutrition Assessment  DOCUMENTATION CODES:   Not applicable  INTERVENTION:  Increase Ensure Enlive po TID, each supplement provides 350 kcal and 20 grams of protein ProSource Plus 30 ml po TID, each supplement provides 100 kcal and 15 grams of protein Magic cup TID with meals, each supplement provides 290 kcal and 9 grams of protein MVI with minerals daily Liberalize diet   NUTRITION DIAGNOSIS:   Increased nutrient needs related to catabolic illness (acute respiratory disease due to COVID-19 virus infection) as evidenced by estimated needs.   GOAL:   Patient will meet greater than or equal to 90% of their needs   MONITOR:   PO intake, Supplement acceptance, Weight trends, Diet advancement, Labs  REASON FOR ASSESSMENT:   Malnutrition Screening Tool    ASSESSMENT:  RD working remotely.   54 year old [name] with no past medical history diagnosed with COVID-19 infection ~1 week ago presented with progressive SOB admitted with acute respiratory disease due to COVID-19 virus.  Patient has no past medical history currently on heart healthy diet. Diet restricts protein and limits menu options. Patient with increased needs secondary to persistent hypermetabolism related to  COVID-19 infection. Discussed with MD via secure chat, will liberalize diet to regular. He is receiving Ensure BID, will increase to TID and order ProSource Plus BID, daily MVI, as well as Magic Cup with meals to aid with meeting increased needs.   Currently pt weighs 203 lbs, no past weight history for review.   Medications reviewed and include: Methylprednisolone, Protonix IVPB: Remdesivir Labs reviewed  NUTRITION - FOCUSED PHYSICAL EXAM: Unable to complete at this time, RD working remotely.  Diet Order:   Diet Order            Diet Heart Room service appropriate? Yes; Fluid consistency: Thin  Diet effective now                 EDUCATION NEEDS:   No education needs have been identified  at this time  Skin:  Skin Assessment: Reviewed RN Assessment  Last BM:  8/13  Height:   Ht Readings from Last 1 Encounters:  08/25/19 6' (1.829 m)    Weight:   Wt Readings from Last 1 Encounters:  08/25/19 92.1 kg    BMI:  Body mass index is 27.53 kg/m.  Estimated Nutritional Needs:   Kcal:  2600-2900  Protein:  138-147  Fluid:  >/= 2.6 L   Lars Masson, RD, LDN Clinical Nutrition After Hours/Weekend Pager # in Amion

## 2019-08-25 NOTE — Plan of Care (Signed)
°  Problem: Education: Goal: Knowledge of General Education information will improve Description: Including pain rating scale, medication(s)/side effects and non-pharmacologic comfort measures Outcome: Progressing   Problem: Activity: Goal: Risk for activity intolerance will decrease Outcome: Progressing   Problem: Pain Managment: Goal: General experience of comfort will improve Outcome: Progressing   Problem: Safety: Goal: Ability to remain free from injury will improve Outcome: Progressing   Problem: Skin Integrity: Goal: Risk for impaired skin integrity will decrease Outcome: Progressing   Problem: Respiratory: Goal: Will maintain a patent airway Outcome: Progressing Goal: Complications related to the disease process, condition or treatment will be avoided or minimized Outcome: Progressing   

## 2019-08-26 LAB — CBC WITH DIFFERENTIAL/PLATELET
Abs Immature Granulocytes: 0.09 10*3/uL — ABNORMAL HIGH (ref 0.00–0.07)
Basophils Absolute: 0 10*3/uL (ref 0.0–0.1)
Basophils Relative: 0 %
Eosinophils Absolute: 0 10*3/uL (ref 0.0–0.5)
Eosinophils Relative: 0 %
HCT: 44.7 % (ref 39.0–52.0)
Hemoglobin: 14.9 g/dL (ref 13.0–17.0)
Immature Granulocytes: 2 %
Lymphocytes Relative: 11 %
Lymphs Abs: 0.5 10*3/uL — ABNORMAL LOW (ref 0.7–4.0)
MCH: 31.2 pg (ref 26.0–34.0)
MCHC: 33.3 g/dL (ref 30.0–36.0)
MCV: 93.7 fL (ref 80.0–100.0)
Monocytes Absolute: 0.3 10*3/uL (ref 0.1–1.0)
Monocytes Relative: 7 %
Neutro Abs: 3.1 10*3/uL (ref 1.7–7.7)
Neutrophils Relative %: 80 %
Platelets: 244 10*3/uL (ref 150–400)
RBC: 4.77 MIL/uL (ref 4.22–5.81)
RDW: 12.1 % (ref 11.5–15.5)
WBC: 4 10*3/uL (ref 4.0–10.5)
nRBC: 0 % (ref 0.0–0.2)

## 2019-08-26 LAB — COMPREHENSIVE METABOLIC PANEL
ALT: 50 U/L — ABNORMAL HIGH (ref 0–44)
AST: 55 U/L — ABNORMAL HIGH (ref 15–41)
Albumin: 3 g/dL — ABNORMAL LOW (ref 3.5–5.0)
Alkaline Phosphatase: 65 U/L (ref 38–126)
Anion gap: 12 (ref 5–15)
BUN: 22 mg/dL — ABNORMAL HIGH (ref 6–20)
CO2: 26 mmol/L (ref 22–32)
Calcium: 8.8 mg/dL — ABNORMAL LOW (ref 8.9–10.3)
Chloride: 98 mmol/L (ref 98–111)
Creatinine, Ser: 1.02 mg/dL (ref 0.61–1.24)
GFR calc Af Amer: >60 mL/min (ref >60)
GFR calc non Af Amer: >60 mL/min (ref >60)
Glucose, Bld: 151 mg/dL — ABNORMAL HIGH (ref 70–99)
Potassium: 4.1 mmol/L (ref 3.5–5.1)
Sodium: 136 mmol/L (ref 135–145)
Total Bilirubin: 0.3 mg/dL (ref 0.3–1.2)
Total Protein: 6.3 g/dL — ABNORMAL LOW (ref 6.5–8.1)

## 2019-08-26 LAB — MAGNESIUM: Magnesium: 2.3 mg/dL (ref 1.7–2.4)

## 2019-08-26 LAB — PROCALCITONIN: Procalcitonin: <0.1 ng/mL

## 2019-08-26 LAB — D-DIMER, QUANTITATIVE: D-Dimer, Quant: 0.57 ug/mL-FEU — ABNORMAL HIGH (ref 0.00–0.50)

## 2019-08-26 LAB — BRAIN NATRIURETIC PEPTIDE: B Natriuretic Peptide: 32.2 pg/mL (ref 0.0–100.0)

## 2019-08-26 LAB — C-REACTIVE PROTEIN: CRP: 6.6 mg/dL — ABNORMAL HIGH (ref <1.0)

## 2019-08-26 MED ORDER — LORAZEPAM 0.5 MG PO TABS
0.5000 mg | ORAL_TABLET | Freq: Once | ORAL | Status: AC | PRN
  Administered 2019-08-26: 0.5 mg via ORAL
  Filled 2019-08-26: qty 1

## 2019-08-26 NOTE — Plan of Care (Signed)
  Problem: Education: Goal: Knowledge of General Education information will improve Description: Including pain rating scale, medication(s)/side effects and non-pharmacologic comfort measures Outcome: Progressing   Problem: Activity: Goal: Risk for activity intolerance will decrease Outcome: Progressing   Problem: Pain Managment: Goal: General experience of comfort will improve Outcome: Progressing   Problem: Safety: Goal: Ability to remain free from injury will improve Outcome: Progressing   Problem: Skin Integrity: Goal: Risk for impaired skin integrity will decrease Outcome: Progressing   Problem: Respiratory: Goal: Will maintain a patent airway Outcome: Progressing Goal: Complications related to the disease process, condition or treatment will be avoided or minimized Outcome: Progressing

## 2019-08-26 NOTE — Significant Event (Signed)
Pt called this RN to room d/t pt stating "I feel shaky like I'm having a panic attack"; on assessment, pt's SpO2 was in the high 80s (but had O2 cannula off of nose) and HR slightly elevated in the high 80s/low 90s. Pt states that he has had panic attacks in the past, but does not take any medication or treatment for it. Pt placed back on O2 and increased from 1L to 2L and pt able to recover to low 90s; lung sounds are clear, but diminished which is no change from previous assessment. Contacted V. Rathore MD for any recommendations for pt's anxiety and was updated on pt's status and VS: current VS are BP (!) 135/94 (BP Location: Right Arm)   Pulse 77   Temp 98.7 F (37.1 C) (Oral)   Resp 14   Ht 6' (1.829 m)   Wt 92.1 kg   SpO2 91%   BMI 27.53 kg/m . Monica Becton also ordered one time dose PRN 0.5 mg PO ativan and this was administered to pt. Will continue to monitor.

## 2019-08-26 NOTE — Progress Notes (Signed)
PROGRESS NOTE                                                                                                                                                                                                             Patient Demographics:    Samuel [name], is a 54 y.o. male, DOB - 1965-10-24, EEF:[id number]  Outpatient Primary MD for the patient is Patient, [name]    LOS - 1  Admit date - 08/24/2019    Chief Complaint  Patient presents with  . Cough       Brief Narrative Samuel [name] is a 54 y.o. male with no significant past medical history presents to the ER at bedside at [facility] with complaint of shortness of breath.  Patient started having upper respiratory tract-like symptoms and headache about a week ago and was diagnosed with COVID-19 infection.  He continued to get progressively short of breath and presented to the ER with severe hypoxia requiring 12 L of oxygen and was admitted to the hospital subsequently.   Subjective:   Patient in bed, appears comfortable, denies any headache, no fever, no chest pain or pressure, no shortness of breath , no abdominal pain. No focal weakness.   Assessment  & Plan :     1. Acute Hypoxic Resp. Failure due to Acute Covid 19 Viral Pneumonitis during the ongoing 2020 Covid 19 Pandemic - he has severe disease and is unfortunately unvaccinated.  He was promptly placed on steroids, remdesivir and received Actemra on the day of admission.  Has shown good stabilization and clinical improvement.  Continue to monitor closely.  Encouraged the patient to sit up in chair in the daytime use I-S and flutter valve for pulmonary toiletry and then prone in bed when at night.  Will advance activity and titrate down oxygen as possible.   SpO2: (!) 88 % O2 Flow Rate (L/min): 3 L/min  Recent Labs  Lab 08/24/19 0010 08/24/19 0316 08/25/19 0337 08/26/19 0630  WBC  --  5.8  7.1 4.0  PLT  --  151 186 244  CRP  --   --  10.7* 6.6*  DDIMER  --   --  0.90* 0.57*  PROCALCITON  --   --  <0.10 <0.10  SARSCOV2NAA POSITIVE*  --   --   --  Hepatic Function Latest Ref Rng & Units 08/26/2019 08/25/2019  Total Protein 6.5 - 8.1 g/dL 6.3(L) 6.6  Albumin 3.5 - 5.0 g/dL 3.0(L) 3.0(L)  AST 15 - 41 U/L 55(H) 33  ALT 0 - 44 U/L 50(H) 22  Alk Phosphatase 38 - 126 U/L 65 59  Total Bilirubin 0.3 - 1.2 mg/dL 0.3 0.5      GERD - PPI      Condition - Extremely Guarded  Family Communication  :  None  Code Status :  Full  Consults  :  None  Procedures  :  None  PUD Prophylaxis : PPI  Disposition Plan  :    Status is: Inpatient  Remains inpatient appropriate because:IV treatments appropriate due to intensity of illness or inability to take PO   Dispo: The patient is from: Home              Anticipated d/c is to: Home              Anticipated d/c date is: > 3 days              Patient currently is not medically stable to d/c.   DVT Prophylaxis  :  Lovenox   Lab Results  Component Value Date   PLT 244 08/26/2019    Diet :  Diet Order            Diet regular Room service appropriate? Yes; Fluid consistency: Thin  Diet effective now                  Inpatient Medications  Scheduled Meds: . (feeding supplement) PROSource Plus  30 mL Oral TID BM  . enoxaparin (LOVENOX) injection  40 mg Subcutaneous Daily  . feeding supplement (ENSURE ENLIVE)  237 mL Oral TID BM  . methylPREDNISolone (SOLU-MEDROL) injection  50 mg Intravenous BID  . multivitamin with minerals  1 tablet Oral Daily  . pantoprazole  40 mg Oral Daily   Continuous Infusions: . remdesivir 100 mg in NS 100 mL 100 mg (08/26/19 0925)   PRN Meds:.acetaminophen, chlorpheniramine-HYDROcodone, ondansetron **OR** ondansetron (ZOFRAN) IV  Antibiotics  :    Anti-infectives (From admission, onward)   Start     Dose/Rate Route Frequency Ordered Stop   08/25/19 1000  remdesivir 100 mg in  sodium chloride 0.9 % 100 mL IVPB     Discontinue     100 mg 200 mL/hr over 30 Minutes Intravenous Daily 08/24/19 0357 08/29/19 0959   08/24/19 0400  remdesivir 100 mg in sodium chloride 0.9 % 100 mL IVPB        100 mg 200 mL/hr over 30 Minutes Intravenous Every 30 min 08/24/19 0357 08/24/19 0623       Time Spent in minutes  30   Lala Lund M.D on 08/26/2019 at 11:16 AM  To page go to [email or web address] - password [facility]  [facility] -  Office  [phone]   See all Orders from today for further details    Objective:   Vitals:   08/25/19 1612 08/25/19 2111 08/26/19 0453 08/26/19 0753  BP: (!) 127/92 (!) 106/92 138/87 122/78  Pulse: 81 93 82 79  Resp: _0 (!) 24  Temp: 98.4 F (36.9 C) 98.3 F (36.8 C) 98.7 F (37.1 C) 98.1 F (36.7 C)  TempSrc: Oral Oral Oral Oral  SpO2: 94% 90% 91% (!) 88%  Weight:      Height:        Wt Readings from  Last 3 Encounters:  08/25/19 92.1 kg     Intake/Output Summary (Last 24 hours) at 08/26/2019 1116 Last data filed at 08/25/2019 1200 Gross per 24 hour  Intake 120 ml  Output --  Net 120 ml     Physical Exam  Awake Alert, Oriented X 3, No new F.N deficits, Normal affect [place].AT,PERRAL Supple Neck,No JVD, No cervical lymphadenopathy appriciated.  Symmetrical Chest wall movement, Good air movement bilaterally, CTAB RRR,No Gallops, Rubs or new Murmurs, No Parasternal Heave +ve B.Sounds, Abd Soft, No tenderness, No organomegaly appriciated, No rebound - guarding or rigidity. No Cyanosis, Clubbing or edema, No new Rash or bruise    Data Review:    CBC Recent Labs  Lab 08/24/19 0316 08/25/19 0337 08/26/19 0630  WBC 5.8 7.1 4.0  HGB 15.1 15.0 14.9  HCT 45.3 44.2 44.7  PLT 151 186 244  MCV 95.8 94.8 93.7  MCH 31.9 32.2 31.2  MCHC 33.3 33.9 33.3  RDW 12.4 12.5 12.1  LYMPHSABS 0.5* 0.8 0.5*  MONOABS 0.2 0.4 0.3  EOSABS 0.0 0.0 0.0  BASOSABS 0.0 0.0 0.0    Chemistries  Recent Labs  Lab 08/24/19 0316  08/25/19 0337 08/26/19 0630  NA 135 136 136  K 4.0 4.1 4.1  CL 98 98 98  CO2 _0 GLUCOSE 121* 109* 151*  BUN 11 16 22*  CREATININE 1.25* 1.21 1.02  CALCIUM 8.4* 8.8* 8.8*  AST  --  33 55*  ALT  --  22 50*  ALKPHOS  --  59 65  BILITOT  --  0.5 0.3  MG  --   --  2.3     ------------------------------------------------------------------------------------------------------------------ No results for input(s): CHOL, HDL, LDLCALC, TRIG, CHOLHDL, LDLDIRECT in the last 72 hours.  No results found for: HGBA1C ------------------------------------------------------------------------------------------------------------------ No results for input(s): TSH, T4TOTAL, T3FREE, THYROIDAB in the last 72 hours.  Invalid input(s): FREET3  Cardiac Enzymes No results for input(s): CKMB, TROPONINI, MYOGLOBIN in the last 168 hours.  Invalid input(s): CK ------------------------------------------------------------------------------------------------------------------    Component Value Date/Time   BNP 32.2 08/26/2019 0630    Micro Results Recent Results (from the past 240 hour(s))  SARS Coronavirus 2 by RT PCR (hospital order, performed in [facility] hospital lab) Nasopharyngeal Nasopharyngeal Swab     Status: Abnormal   Collection Time: 08/24/19 12:10 AM   Specimen: Nasopharyngeal Swab  Result Value Ref Range Status   SARS Coronavirus 2 POSITIVE (A) NEGATIVE Final    Comment: RESULT CALLED TO, READ BACK BY AND VERIFIED WITH: ADKINS,L AT 0105 ON [id number] BY CHERESNOWSKY,T (NOTE) SARS-CoV-2 target nucleic acids are DETECTED  SARS-CoV-2 RNA is generally detectable in upper respiratory specimens  during the acute phase of infection.  Positive results are indicative  of the presence of the identified virus, but do not rule out bacterial infection or co-infection with other pathogens not detected by the test.  Clinical correlation with patient history and  other diagnostic information is  necessary to determine patient infection status.  The expected result is negative.  Fact Sheet for Patients:   StrictlyIdeas.no   Fact Sheet for Healthcare Providers:   BankingDealers.co.za    This test is not yet approved or cleared by the [place] FDA and  has been authorized for detection and/or diagnosis of SARS-CoV-2 by FDA under an Emergency Use Authorization (EUA).  This EUA will remain in effect (meani ng this test can be used) for the duration of  the COVID-19 declaration under Section 564(b)(1) of the  Act, 21 U.S.C. section 360-bbb-3(b)(1), unless the authorization is terminated or revoked sooner.  Performed at [facility], [address] [place], [place] [address]     Radiology Reports DG Chest Portable 1 View  Result Date: 08/24/2019 CLINICAL DATA:  Cough fever COVID positive EXAM: PORTABLE CHEST 1 VIEW COMPARISON:  None. FINDINGS: Streaky bilateral pulmonary opacities. Normal heart size. No pneumothorax or pleural effusion IMPRESSION: Streaky bilateral pulmonary opacities suspicious for bilateral pneumonia. Electronically Signed   By: Donavan Foil M.D.   On: 08/24/2019 00:41

## 2019-08-27 ENCOUNTER — Inpatient Hospital Stay (HOSPITAL_COMMUNITY): Payer: 59

## 2019-08-27 ENCOUNTER — Other Ambulatory Visit (HOSPITAL_COMMUNITY): Payer: 59

## 2019-08-27 DIAGNOSIS — I5031 Acute diastolic (congestive) heart failure: Secondary | ICD-10-CM | POA: Diagnosis not present

## 2019-08-27 LAB — BLOOD GAS, ARTERIAL
Acid-Base Excess: 2.4 mmol/L — ABNORMAL HIGH (ref 0.0–2.0)
Bicarbonate: 26 mmol/L (ref 20.0–28.0)
FIO2: 52
O2 Saturation: 83 %
Patient temperature: 36.6
pCO2 arterial: 36.4 mmHg (ref 32.0–48.0)
pH, Arterial: 7.466 — ABNORMAL HIGH (ref 7.350–7.450)
pO2, Arterial: 46.7 mmHg — ABNORMAL LOW (ref 83.0–108.0)

## 2019-08-27 LAB — D-DIMER, QUANTITATIVE: D-Dimer, Quant: 0.76 ug/mL-FEU — ABNORMAL HIGH (ref 0.00–0.50)

## 2019-08-27 LAB — COMPREHENSIVE METABOLIC PANEL
ALT: 432 U/L — ABNORMAL HIGH (ref 0–44)
AST: 306 U/L — ABNORMAL HIGH (ref 15–41)
Albumin: 3 g/dL — ABNORMAL LOW (ref 3.5–5.0)
Alkaline Phosphatase: 82 U/L (ref 38–126)
Anion gap: 12 (ref 5–15)
BUN: 24 mg/dL — ABNORMAL HIGH (ref 6–20)
CO2: 28 mmol/L (ref 22–32)
Calcium: 8.7 mg/dL — ABNORMAL LOW (ref 8.9–10.3)
Chloride: 98 mmol/L (ref 98–111)
Creatinine, Ser: 1.12 mg/dL (ref 0.61–1.24)
GFR calc Af Amer: >60 mL/min (ref >60)
GFR calc non Af Amer: >60 mL/min (ref >60)
Glucose, Bld: 152 mg/dL — ABNORMAL HIGH (ref 70–99)
Potassium: 4.3 mmol/L (ref 3.5–5.1)
Sodium: 138 mmol/L (ref 135–145)
Total Bilirubin: 0.9 mg/dL (ref 0.3–1.2)
Total Protein: 6.2 g/dL — ABNORMAL LOW (ref 6.5–8.1)

## 2019-08-27 LAB — ECHOCARDIOGRAM COMPLETE
Area-P 1/2: 2.71 cm2
Height: 72 in
S' Lateral: 3 cm
Weight: 3248 oz

## 2019-08-27 LAB — CBC WITH DIFFERENTIAL/PLATELET
Abs Immature Granulocytes: 0.14 10*3/uL — ABNORMAL HIGH (ref 0.00–0.07)
Basophils Absolute: 0 10*3/uL (ref 0.0–0.1)
Basophils Relative: 0 %
Eosinophils Absolute: 0 10*3/uL (ref 0.0–0.5)
Eosinophils Relative: 0 %
HCT: 46.1 % (ref 39.0–52.0)
Hemoglobin: 15.4 g/dL (ref 13.0–17.0)
Immature Granulocytes: 2 %
Lymphocytes Relative: 10 %
Lymphs Abs: 0.7 10*3/uL (ref 0.7–4.0)
MCH: 31.4 pg (ref 26.0–34.0)
MCHC: 33.4 g/dL (ref 30.0–36.0)
MCV: 93.9 fL (ref 80.0–100.0)
Monocytes Absolute: 0.3 10*3/uL (ref 0.1–1.0)
Monocytes Relative: 5 %
Neutro Abs: 6 10*3/uL (ref 1.7–7.7)
Neutrophils Relative %: 83 %
Platelets: 306 10*3/uL (ref 150–400)
RBC: 4.91 MIL/uL (ref 4.22–5.81)
RDW: 11.9 % (ref 11.5–15.5)
WBC: 7.2 10*3/uL (ref 4.0–10.5)
nRBC: 0 % (ref 0.0–0.2)

## 2019-08-27 LAB — BRAIN NATRIURETIC PEPTIDE: B Natriuretic Peptide: 128.7 pg/mL — ABNORMAL HIGH (ref 0.0–100.0)

## 2019-08-27 LAB — MAGNESIUM: Magnesium: 2.3 mg/dL (ref 1.7–2.4)

## 2019-08-27 LAB — PROTIME-INR
INR: 0.9 (ref 0.8–1.2)
Prothrombin Time: 12.2 seconds (ref 11.4–15.2)

## 2019-08-27 LAB — PROCALCITONIN: Procalcitonin: <0.1 ng/mL

## 2019-08-27 LAB — C-REACTIVE PROTEIN: CRP: 3.4 mg/dL — ABNORMAL HIGH (ref <1.0)

## 2019-08-27 MED ORDER — METHYLPREDNISOLONE SODIUM SUCC 125 MG IJ SOLR
50.0000 mg | Freq: Every day | INTRAMUSCULAR | Status: DC
  Administered 2019-08-28 – 2019-09-02 (×6): 50 mg via INTRAVENOUS
  Filled 2019-08-27 (×6): qty 2

## 2019-08-27 MED ORDER — FUROSEMIDE 10 MG/ML IJ SOLN
INTRAMUSCULAR | Status: AC
  Administered 2019-08-27: 40 mg
  Filled 2019-08-27: qty 4

## 2019-08-27 MED ORDER — IOHEXOL 350 MG/ML SOLN
80.0000 mL | Freq: Once | INTRAVENOUS | Status: AC | PRN
  Administered 2019-08-27: 80 mL via INTRAVENOUS

## 2019-08-27 MED ORDER — PERFLUTREN LIPID MICROSPHERE
1.0000 mL | INTRAVENOUS | Status: AC | PRN
  Administered 2019-08-27: 2 mL via INTRAVENOUS
  Filled 2019-08-27: qty 10

## 2019-08-27 MED ORDER — FUROSEMIDE 10 MG/ML IJ SOLN
40.0000 mg | Freq: Once | INTRAMUSCULAR | Status: AC
  Filled 2019-08-27: qty 4

## 2019-08-27 MED ORDER — METOLAZONE 2.5 MG PO TABS
2.5000 mg | ORAL_TABLET | Freq: Once | ORAL | Status: AC
  Administered 2019-08-27: 2.5 mg via ORAL
  Filled 2019-08-27: qty 1

## 2019-08-27 NOTE — Progress Notes (Signed)
  Echocardiogram 2D Echocardiogram with definity has been performed.  Leta Jungling M 08/27/2019, 10:17 AM

## 2019-08-27 NOTE — Progress Notes (Signed)
PROGRESS NOTE                                                                                                                                                                                                             Patient Demographics:    Samuel [name], is a 54 y.o. male, DOB - 05-28-1965, QRF:[id number]  Outpatient Primary MD for the patient is Patient, [name]    LOS - 2  Admit date - 08/24/2019    Chief Complaint  Patient presents with  . Cough       Brief Narrative Samuel [name] is a 53 y.o. male with no significant past medical history presents to the ER at bedside at [facility] with complaint of shortness of breath.  Patient started having upper respiratory tract-like symptoms and headache about a week ago and was diagnosed with COVID-19 infection.  He continued to get progressively short of breath and presented to the ER with severe hypoxia requiring 12 L of oxygen and was admitted to the hospital subsequently.   Subjective:   Patient in bed, appears comfortable, denies any headache, no fever, no chest pain or pressure, improved shortness of breath , no abdominal pain. No focal weakness.   Assessment  & Plan :     Acute Hypoxic Resp. Failure due to Acute Covid 19 Viral Pneumonitis during the ongoing 2020 Covid 19 Pandemic - he has severe disease and is unfortunately unvaccinated.  He was promptly placed on steroids, remdesivir and received Actemra on the day of admission.  He had shown good improvement however evening of 08/26/2019 after going to the bed he abruptly became short of breath and developed a panic attack, he has been placed on 15 L of oxygen.  On exam he has a few rails for which she will be diuresed and monitored closely.  This could be waxing and waning COVID -19 infection although his inflammatory markers have stabilized.  Echocardiogram was nonacute, we will also check CTA of the  chest.  Encouraged the patient to sit up in chair in the daytime use I-S and flutter valve for pulmonary toiletry and then prone in bed when at night.  Will advance activity and titrate down oxygen as possible.   SpO2: 91 % O2 Flow Rate (L/min): 15 L/min  Recent Labs  Lab 08/24/19 0010 08/24/19  6803 08/25/19 0337 08/26/19 0630 08/27/19 0339  WBC  --  5.8 7.1 4.0 7.2  PLT  --  151 186 244 306  CRP  --   --  10.7* 6.6* 3.4*  DDIMER  --   --  0.90* 0.57* 0.76*  PROCALCITON  --   --  <0.10 <0.10 <0.10  SARSCOV2NAA POSITIVE*  --   --   --   --     Hepatic Function Latest Ref Rng & Units 08/27/2019 08/26/2019 08/25/2019  Total Protein 6.5 - 8.1 g/dL 6.2(L) 6.3(L) 6.6  Albumin 3.5 - 5.0 g/dL 3.0(L) 3.0(L) 3.0(L)  AST 15 - 41 U/L 306(H) 55(H) 33  ALT 0 - 44 U/L 432(H) 50(H) 22  Alk Phosphatase 38 - 126 U/L 82 65 59  Total Bilirubin 0.3 - 1.2 mg/dL 0.9 0.3 0.5   No results found for: INR, PROTIME    Rising LFTs.  Due to combination of COVID-19 infection, remdesivir and Actemra use.  Asymptomatic but will check right upper quadrant ultrasound, will monitor synthetic function closely.    GERD - PPI     Condition - Extremely Guarded  Family Communication  :  Wife [phone] On 08/27/2019 at 11:15 AM  Code Status :  Full  Consults  :  None  Procedures  :    TTE  -  1. Left ventricular ejection fraction, by estimation, is 60 to 65%. The left ventricle has normal function. The left ventricle has no regional wall motion abnormalities. There is mild left ventricular hypertrophy. Left ventricular diastolic parameters were normal.  2. Right ventricular systolic function is normal. The right ventricular size is normal.  3. The mitral valve is normal in structure. No evidence of mitral valve regurgitation. No evidence of mitral stenosis.  4. The aortic valve is normal in structure. Aortic valve regurgitation is not visualized. No aortic stenosis is present.  5. The inferior vena cava is  normal in size with greater than 50% respiratory variability, suggesting right atrial pressure of 3 mmHg.    CTA  -   RUQ [place] -    PUD Prophylaxis : PPI  Disposition Plan  :    Status is: Inpatient  Remains inpatient appropriate because:IV treatments appropriate due to intensity of illness or inability to take PO   Dispo: The patient is from: Home              Anticipated d/c is to: Home              Anticipated d/c date is: > 3 days              Patient currently is not medically stable to d/c.   DVT Prophylaxis  :  Lovenox   Lab Results  Component Value Date   PLT 306 08/27/2019    Diet :  Diet Order            Diet regular Room service appropriate? Yes; Fluid consistency: Thin  Diet effective now                  Inpatient Medications  Scheduled Meds:  . (feeding supplement) PROSource Plus  30 mL Oral TID BM  . enoxaparin (LOVENOX) injection  40 mg Subcutaneous Daily  . feeding supplement (ENSURE ENLIVE)  237 mL Oral TID BM  . [START ON 08/28/2019] methylPREDNISolone (SOLU-MEDROL) injection  50 mg Intravenous Daily  . metolazone  2.5 mg Oral Once  . multivitamin with minerals  1 tablet Oral Daily  .  pantoprazole  40 mg Oral Daily   Continuous Infusions: . remdesivir 100 mg in NS 100 mL 100 mg (08/27/19 0910)   PRN Meds:.acetaminophen, chlorpheniramine-HYDROcodone, ondansetron **OR** ondansetron (ZOFRAN) IV, perflutren lipid microspheres (DEFINITY) IV suspension  Antibiotics  :    Anti-infectives (From admission, onward)   Start     Dose/Rate Route Frequency Ordered Stop   08/25/19 1000  remdesivir 100 mg in sodium chloride 0.9 % 100 mL IVPB     Discontinue     100 mg 200 mL/hr over 30 Minutes Intravenous Daily 08/24/19 0357 08/29/19 0959   08/24/19 0400  remdesivir 100 mg in sodium chloride 0.9 % 100 mL IVPB        100 mg 200 mL/hr over 30 Minutes Intravenous Every 30 min 08/24/19 0357 08/24/19 0623       Time Spent in minutes  30   Lala Lund M.D on 08/27/2019 at 11:06 AM  To page go to [email or web address] - password [facility]  [facility] -  Office  [phone]   See all Orders from today for further details    Objective:   Vitals:   08/27/19 0408 08/27/19 0412 08/27/19 0744 08/27/19 1031  BP:   131/86   Pulse:  78 83   Resp:  (!) 25 (!) 32   Temp:   98.2 F (36.8 C)   TempSrc:   Oral   SpO2: 90% 91% 91% 91%  Weight:      Height:        Wt Readings from Last 3 Encounters:  08/25/19 92.1 kg     Intake/Output Summary (Last 24 hours) at 08/27/2019 1106 Last data filed at 08/26/2019 2034 Gross per 24 hour  Intake 240 ml  Output --  Net 240 ml     Physical Exam  Awake Alert, No new F.N deficits, Normal affect [place].AT,PERRAL Supple Neck,No JVD, No cervical lymphadenopathy appriciated.  Symmetrical Chest wall movement, Good air movement bilaterally, +ve rales RRR,No Gallops, Rubs or new Murmurs, No Parasternal Heave +ve B.Sounds, Abd Soft, No tenderness, No organomegaly appriciated, No rebound - guarding or rigidity. No Cyanosis, Clubbing or edema, No new Rash or bruise    Data Review:    CBC Recent Labs  Lab 08/24/19 0316 08/25/19 0337 08/26/19 0630 08/27/19 0339  WBC 5.8 7.1 4.0 7.2  HGB 15.1 15.0 14.9 15.4  HCT 45.3 44.2 44.7 46.1  PLT 151 186 244 306  MCV 95.8 94.8 93.7 93.9  MCH 31.9 32.2 31.2 31.4  MCHC 33.3 33.9 33.3 33.4  RDW 12.4 12.5 12.1 11.9  LYMPHSABS 0.5* 0.8 0.5* 0.7  MONOABS 0.2 0.4 0.3 0.3  EOSABS 0.0 0.0 0.0 0.0  BASOSABS 0.0 0.0 0.0 0.0    Chemistries  Recent Labs  Lab 08/24/19 0316 08/25/19 0337 08/26/19 0630 08/27/19 0339  NA 135 136 136 138  K 4.0 4.1 4.1 4.3  CL 98 98 98 98  CO2 '26 28 26 28  ' GLUCOSE 121* 109* 151* 152*  BUN 11 16 22* 24*  CREATININE 1.25* 1.21 1.02 1.12  CALCIUM 8.4* 8.8* 8.8* 8.7*  AST  --  33 55* 306*  ALT  --  22 50* 432*  ALKPHOS  --  59 65 82  BILITOT  --  0.5 0.3 0.9  MG  --   --  2.3 2.3      ------------------------------------------------------------------------------------------------------------------ No results for input(s): CHOL, HDL, LDLCALC, TRIG, CHOLHDL, LDLDIRECT in the last 72 hours.  No results found for: HGBA1C ------------------------------------------------------------------------------------------------------------------ No  results for input(s): TSH, T4TOTAL, T3FREE, THYROIDAB in the last 72 hours.  Invalid input(s): FREET3  Cardiac Enzymes No results for input(s): CKMB, TROPONINI, MYOGLOBIN in the last 168 hours.  Invalid input(s): CK ------------------------------------------------------------------------------------------------------------------    Component Value Date/Time   BNP 128.7 (H) 08/27/2019 1937    Micro Results Recent Results (from the past 240 hour(s))  SARS Coronavirus 2 by RT PCR (hospital order, performed in [facility] hospital lab) Nasopharyngeal Nasopharyngeal Swab     Status: Abnormal   Collection Time: 08/24/19 12:10 AM   Specimen: Nasopharyngeal Swab  Result Value Ref Range Status   SARS Coronavirus 2 POSITIVE (A) NEGATIVE Final    Comment: RESULT CALLED TO, READ BACK BY AND VERIFIED WITH: ADKINS,L AT 0105 ON [id number] BY CHERESNOWSKY,T (NOTE) SARS-CoV-2 target nucleic acids are DETECTED  SARS-CoV-2 RNA is generally detectable in upper respiratory specimens  during the acute phase of infection.  Positive results are indicative  of the presence of the identified virus, but do not rule out bacterial infection or co-infection with other pathogens not detected by the test.  Clinical correlation with patient history and  other diagnostic information is necessary to determine patient infection status.  The expected result is negative.  Fact Sheet for Patients:   StrictlyIdeas.no   Fact Sheet for Healthcare Providers:   BankingDealers.co.za    This test is not yet approved or  cleared by the [place] FDA and  has been authorized for detection and/or diagnosis of SARS-CoV-2 by FDA under an Emergency Use Authorization (EUA).  This EUA will remain in effect (meani ng this test can be used) for the duration of  the COVID-19 declaration under Section 564(b)(1) of the Act, 21 U.S.C. section 360-bbb-3(b)(1), unless the authorization is terminated or revoked sooner.  Performed at [facility], [address] [place], [place] [address]     Radiology Reports DG Chest [place] 1 View  Result Date: 08/27/2019 CLINICAL DATA:  Shortness of breath EXAM: PORTABLE CHEST 1 VIEW COMPARISON:  08/24/2019 chest radiograph. FINDINGS: Peripheral predominant streaky pulmonary opacities are unchanged. No pneumothorax or pleural effusion. Cardiomediastinal silhouette is unchanged. No acute osseous abnormality. IMPRESSION: Multifocal pneumonia, grossly unchanged. Electronically Signed   By: Primitivo Gauze M.D.   On: 08/27/2019 09:51   DG Chest Portable 1 View  Result Date: 08/24/2019 CLINICAL DATA:  Cough fever COVID positive EXAM: PORTABLE CHEST 1 VIEW COMPARISON:  None. FINDINGS: Streaky bilateral pulmonary opacities. Normal heart size. No pneumothorax or pleural effusion IMPRESSION: Streaky bilateral pulmonary opacities suspicious for bilateral pneumonia. Electronically Signed   By: Donavan Foil M.D.   On: 08/24/2019 00:41   ECHOCARDIOGRAM COMPLETE  Result Date: 08/27/2019    ECHOCARDIOGRAM REPORT   Patient Name:   Samuel [name] Date of Exam: 08/27/2019 Medical Rec #:  [id number]                Height:       72.0 in Accession #:    [id number]               Weight:       203.0 lb Date of Birth:  [birth year]                BSA:          2.144 m Patient Age:    30 years                 BP:  121/80 mmHg Patient Gender: M                        HR:           74 bpm. Exam Location:  Inpatient Procedure: 2D Echo and Intracardiac Opacification Agent Indications:     CHF-Acute Diastolic 287.68 / T15.72  History:        Patient has no prior history of Echocardiogram examinations.                 Signs/Symptoms:Shortness of Breath. Covid 19. GERD.  Sonographer:    Darlina Sicilian RDCS Referring Phys: Graylin Shiver [facility]  Sonographer Comments: Suboptimal apical window and suboptimal subcostal window. IMPRESSIONS  1. Left ventricular ejection fraction, by estimation, is 60 to 65%. The left ventricle has normal function. The left ventricle has no regional wall motion abnormalities. There is mild left ventricular hypertrophy. Left ventricular diastolic parameters were normal.  2. Right ventricular systolic function is normal. The right ventricular size is normal.  3. The mitral valve is normal in structure. No evidence of mitral valve regurgitation. No evidence of mitral stenosis.  4. The aortic valve is normal in structure. Aortic valve regurgitation is not visualized. No aortic stenosis is present.  5. The inferior vena cava is normal in size with greater than 50% respiratory variability, suggesting right atrial pressure of 3 mmHg. FINDINGS  Left Ventricle: Left ventricular ejection fraction, by estimation, is 60 to 65%. The left ventricle has normal function. The left ventricle has no regional wall motion abnormalities. Definity contrast agent was given IV to delineate the left ventricular  endocardial borders. The left ventricular internal cavity size was normal in size. There is mild left ventricular hypertrophy. Left ventricular diastolic parameters were normal. Right Ventricle: The right ventricular size is normal. No increase in right ventricular wall thickness. Right ventricular systolic function is normal. Left Atrium: Left atrial size was normal in size. Right Atrium: Right atrial size was normal in size. Pericardium: There is no evidence of pericardial effusion. Mitral Valve: The mitral valve is normal in structure. Normal mobility of the mitral valve leaflets. No  evidence of mitral valve regurgitation. No evidence of mitral valve stenosis. Tricuspid Valve: The tricuspid valve is normal in structure. Tricuspid valve regurgitation is not demonstrated. No evidence of tricuspid stenosis. Aortic Valve: The aortic valve is normal in structure. Aortic valve regurgitation is not visualized. No aortic stenosis is present. Pulmonic Valve: The pulmonic valve was normal in structure. Pulmonic valve regurgitation is not visualized. No evidence of pulmonic stenosis. Aorta: The aortic root is normal in size and structure. Venous: The inferior vena cava is normal in size with greater than 50% respiratory variability, suggesting right atrial pressure of 3 mmHg. IAS/Shunts: The interatrial septum was not well visualized.  LEFT VENTRICLE PLAX 2D LVIDd:         4.20 cm  Diastology LVIDs:         3.00 cm  LV e' lateral:   4.90 cm/s LV PW:         0.70 cm  LV E/e' lateral: 14.1 LV IVS:        2.45 cm  LV e' medial:    4.57 cm/s LVOT diam:     2.30 cm  LV E/e' medial:  15.1 LV SV:         74 LV SV Index:   34 LVOT Area:     4.15 cm  LEFT ATRIUM  Index LA diam:    2.10 cm 0.98 cm/m  AORTIC VALVE LVOT Vmax:   57.20 cm/s LVOT Vmean:  59.100 cm/s LVOT VTI:    0.178 m  AORTA Ao Root diam: 3.70 cm MITRAL VALVE MV Area (PHT): 2.71 cm    SHUNTS MV Decel Time: 280 msec    Systemic VTI:  0.18 m MV E velocity: 69.20 cm/s  Systemic Diam: 2.30 cm MV A velocity: 65.55 cm/s MV E/A ratio:  1.06 Jenkins Rouge MD Electronically signed by Jenkins Rouge MD Signature Date/Time: 08/27/2019/10:56:13 AM    Final

## 2019-08-27 NOTE — Significant Event (Addendum)
Pt called this RN to room and states that he once again is "feeling jittery and my oxygen isn't going up". Pt was on 2L and this RN increased it to 6L to maintain at least 90% SpO2; paged MD V Rathore for an ABG order and any recommendations. Most recent vitals are:  Vitals:   08/26/19 2309 08/27/19 0346  BP: (!) 135/94   Pulse: 77 83  Resp: 14 (!) 21  Temp: 98.7 F (37.1 C) 98 F (36.7 C)  SpO2: 91% 90%  Pt also c/o having dry mouth and some nasal drainage that is causing him to have some coughing. Lung sounds are clear, but diminished which is the same as previous assessment. Will continue to monitor.  Update 0400: Switched pt from nasal cannula to HFNC on 8L O2. SpO2 at 90%.  Update 0510:  ABG    Component Value Date/Time   PHART 7.466 (H) 08/27/2019 0440   PCO2ART 36.4 08/27/2019 0440   PO2ART 46.7 (L) 08/27/2019 0440   HCO3 26.0 08/27/2019 0440   O2SAT 83.0 08/27/2019 0440  Per respiratory, increase O2 from 8L to 15L and encourage pt to prone or lay on side and pt states that laying on side feels more comfortable.

## 2019-08-27 NOTE — Progress Notes (Signed)
Talked to Dr Thedore Mins, [place], MD for order clarification for ABG order. York Spaniel he will call back if he wants ABG done.

## 2019-08-27 NOTE — Progress Notes (Signed)
Updated pt's wife Inetta Fermo) on pt's status and current plan of care.

## 2019-08-28 LAB — COMPREHENSIVE METABOLIC PANEL
ALT: 369 U/L — ABNORMAL HIGH (ref 0–44)
AST: 121 U/L — ABNORMAL HIGH (ref 15–41)
Albumin: 3.2 g/dL — ABNORMAL LOW (ref 3.5–5.0)
Alkaline Phosphatase: 82 U/L (ref 38–126)
Anion gap: 13 (ref 5–15)
BUN: 28 mg/dL — ABNORMAL HIGH (ref 6–20)
CO2: 26 mmol/L (ref 22–32)
Calcium: 8.9 mg/dL (ref 8.9–10.3)
Chloride: 95 mmol/L — ABNORMAL LOW (ref 98–111)
Creatinine, Ser: 0.92 mg/dL (ref 0.61–1.24)
GFR calc Af Amer: >60 mL/min (ref >60)
GFR calc non Af Amer: >60 mL/min (ref >60)
Glucose, Bld: 101 mg/dL — ABNORMAL HIGH (ref 70–99)
Potassium: 3.4 mmol/L — ABNORMAL LOW (ref 3.5–5.1)
Sodium: 134 mmol/L — ABNORMAL LOW (ref 135–145)
Total Bilirubin: 0.8 mg/dL (ref 0.3–1.2)
Total Protein: 6.5 g/dL (ref 6.5–8.1)

## 2019-08-28 LAB — CBC WITH DIFFERENTIAL/PLATELET
Abs Immature Granulocytes: 0.14 10*3/uL — ABNORMAL HIGH (ref 0.00–0.07)
Basophils Absolute: 0 10*3/uL (ref 0.0–0.1)
Basophils Relative: 0 %
Eosinophils Absolute: 0 10*3/uL (ref 0.0–0.5)
Eosinophils Relative: 0 %
HCT: 48.6 % (ref 39.0–52.0)
Hemoglobin: 16.4 g/dL (ref 13.0–17.0)
Immature Granulocytes: 2 %
Lymphocytes Relative: 16 %
Lymphs Abs: 1.4 10*3/uL (ref 0.7–4.0)
MCH: 31.1 pg (ref 26.0–34.0)
MCHC: 33.7 g/dL (ref 30.0–36.0)
MCV: 92.2 fL (ref 80.0–100.0)
Monocytes Absolute: 0.4 10*3/uL (ref 0.1–1.0)
Monocytes Relative: 4 %
Neutro Abs: 6.7 10*3/uL (ref 1.7–7.7)
Neutrophils Relative %: 78 %
Platelets: 337 10*3/uL (ref 150–400)
RBC: 5.27 MIL/uL (ref 4.22–5.81)
RDW: 12 % (ref 11.5–15.5)
WBC: 8.6 10*3/uL (ref 4.0–10.5)
nRBC: 0 % (ref 0.0–0.2)

## 2019-08-28 LAB — D-DIMER, QUANTITATIVE: D-Dimer, Quant: 0.73 ug/mL-FEU — ABNORMAL HIGH (ref 0.00–0.50)

## 2019-08-28 LAB — PROTIME-INR
INR: 0.9 (ref 0.8–1.2)
Prothrombin Time: 12.2 seconds (ref 11.4–15.2)

## 2019-08-28 LAB — BRAIN NATRIURETIC PEPTIDE: B Natriuretic Peptide: 39.9 pg/mL (ref 0.0–100.0)

## 2019-08-28 LAB — C-REACTIVE PROTEIN: CRP: 1.6 mg/dL — ABNORMAL HIGH (ref <1.0)

## 2019-08-28 LAB — MAGNESIUM: Magnesium: 2.5 mg/dL — ABNORMAL HIGH (ref 1.7–2.4)

## 2019-08-28 MED ORDER — POTASSIUM CHLORIDE 20 MEQ PO PACK
40.0000 meq | PACK | Freq: Two times a day (BID) | ORAL | Status: AC
  Administered 2019-08-28 (×2): 40 meq via ORAL
  Filled 2019-08-28 (×2): qty 2

## 2019-08-28 MED ORDER — POTASSIUM CHLORIDE 20 MEQ PO PACK: 40.0000 meq | PACK | Freq: Once | ORAL | Status: DC

## 2019-08-28 MED ORDER — IPRATROPIUM-ALBUTEROL 20-100 MCG/ACT IN AERS
1.0000 | INHALATION_SPRAY | Freq: Three times a day (TID) | RESPIRATORY_TRACT | Status: DC
  Administered 2019-08-28 – 2019-09-09 (×37): 1 via RESPIRATORY_TRACT
  Filled 2019-08-28 (×2): qty 4

## 2019-08-28 MED ORDER — MAGNESIUM SULFATE 2 GM/50ML IV SOLN: 2.0000 g | Freq: Once | INTRAVENOUS | Status: DC

## 2019-08-28 MED ORDER — FUROSEMIDE 10 MG/ML IJ SOLN
20.0000 mg | Freq: Once | INTRAMUSCULAR | Status: AC
  Administered 2019-08-28: 20 mg via INTRAVENOUS
  Filled 2019-08-28: qty 2

## 2019-08-28 MED ORDER — CLONAZEPAM 1 MG PO TABS
1.0000 mg | ORAL_TABLET | Freq: Every day | ORAL | Status: DC
  Administered 2019-08-28 – 2019-08-31 (×5): 1 mg via ORAL
  Filled 2019-08-28 (×5): qty 1

## 2019-08-28 NOTE — Progress Notes (Signed)
PROGRESS NOTE                                                                                                                                                                                                             Patient Demographics:    Samuel [name], is a 54 y.o. male, DOB - 08-29-1965, GMW:[id number]  Outpatient Primary MD for the patient is Patient, [name]    LOS - 3  Admit date - 08/24/2019    Chief Complaint  Patient presents with  . Cough       Brief Narrative Samuel [name] is a 54 y.o. male with no significant past medical history presents to the ER at bedside at [facility] with complaint of shortness of breath.  Patient started having upper respiratory tract-like symptoms and headache about a week ago and was diagnosed with COVID-19 infection.  He continued to get progressively short of breath and presented to the ER with severe hypoxia requiring 12 L of oxygen and was admitted to the hospital subsequently.   Subjective:   Patient in bed, appears comfortable, denies any headache, no fever, no chest pain or pressure, no shortness of breath , no abdominal pain. No focal weakness.   Assessment  & Plan :     Acute Hypoxic Resp. Failure due to Acute Covid 19 Viral Pneumonitis during the ongoing 2020 Covid 19 Pandemic - he has severe disease and is unfortunately unvaccinated.  He was promptly placed on steroids, remdesivir and received Actemra on the day of admission.  He had shown good improvement however evening of 08/26/2019 after going to the bed he abruptly became short of breath and developed a panic attack, he has been placed on 15 L of oxygen.  On exam he has a few rails for which she will be diuresed and monitored closely.  This could be waxing and waning COVID -19 infection although his inflammatory markers have stabilized.  Echocardiogram was nonacute, we will also check CTA of the  chest.  Encouraged the patient to sit up in chair in the daytime use I-S and flutter valve for pulmonary toiletry and then prone in bed when at night.  Will advance activity and titrate down oxygen as possible.   SpO2: 93 % O2 Flow Rate (L/min): 10 L/min  Recent Labs  Lab 08/24/19 0010 08/24/19  1610 08/25/19 0337 08/26/19 0630 08/27/19 0339 08/28/19 0500  WBC  --  5.8 7.1 4.0 7.2 8.6  PLT  --  151 186 244 306 337  CRP  --   --  10.7* 6.6* 3.4* 1.6*  DDIMER  --   --  0.90* 0.57* 0.76* 0.73*  PROCALCITON  --   --  <0.10 <0.10 <0.10  --   SARSCOV2NAA POSITIVE*  --   --   --   --   --     Hepatic Function Latest Ref Rng & Units 08/28/2019 08/27/2019 08/26/2019  Total Protein 6.5 - 8.1 g/dL 6.5 6.2(L) 6.3(L)  Albumin 3.5 - 5.0 g/dL 3.2(L) 3.0(L) 3.0(L)  AST 15 - 41 U/L 121(H) 306(H) 55(H)  ALT 0 - 44 U/L 369(H) 432(H) 50(H)  Alk Phosphatase 38 - 126 U/L 82 82 65  Total Bilirubin 0.3 - 1.2 mg/dL 0.8 0.9 0.3   Lab Results  Component Value Date   INR 0.9 08/28/2019   INR 0.9 08/27/2019      Rising LFTs.  Due to combination of COVID-19 infection, remdesivir and Actemra use.  Asymptomatic with stable right upper quadrant ultrasound and improving LFT trend.   GERD - PPI    Condition - Extremely Guarded  Family Communication  :  Wife [phone] On 08/27/2019 at 11:15 AM, 08/28/19  Code Status :  Full  Consults  :  None  Procedures  :    TTE  -  1. Left ventricular ejection fraction, by estimation, is 60 to 65%. The left ventricle has normal function. The left ventricle has no regional wall motion abnormalities. There is mild left ventricular hypertrophy. Left ventricular diastolic parameters were normal.  2. Right ventricular systolic function is normal. The right ventricular size is normal.  3. The mitral valve is normal in structure. No evidence of mitral valve regurgitation. No evidence of mitral stenosis.  4. The aortic valve is normal in structure. Aortic valve  regurgitation is not visualized. No aortic stenosis is present.  5. The inferior vena cava is normal in size with greater than 50% respiratory variability, suggesting right atrial pressure of 3 mmHg.    CTA  - No PE  RUQ [place] - Non acute   PUD Prophylaxis : PPI  Disposition Plan  :    Status is: Inpatient  Remains inpatient appropriate because:IV treatments appropriate due to intensity of illness or inability to take PO   Dispo: The patient is from: Home              Anticipated d/c is to: Home              Anticipated d/c date is: > 3 days              Patient currently is not medically stable to d/c.   DVT Prophylaxis  :  Lovenox   Lab Results  Component Value Date   PLT 337 08/28/2019    Diet :  Diet Order            Diet regular Room service appropriate? Yes; Fluid consistency: Thin  Diet effective now                  Inpatient Medications  Scheduled Meds:  . (feeding supplement) PROSource Plus  30 mL Oral TID BM  . clonazePAM  1 mg Oral Daily  . enoxaparin (LOVENOX) injection  40 mg Subcutaneous Daily  . feeding supplement (ENSURE ENLIVE)  237 mL Oral TID BM  .  furosemide  20 mg Intravenous Once  . Ipratropium-Albuterol  1 puff Inhalation TID  . methylPREDNISolone (SOLU-MEDROL) injection  50 mg Intravenous Daily  . multivitamin with minerals  1 tablet Oral Daily  . pantoprazole  40 mg Oral Daily  . potassium chloride  40 mEq Oral BID   Continuous Infusions:  PRN Meds:.acetaminophen, chlorpheniramine-HYDROcodone, ondansetron **OR** ondansetron (ZOFRAN) IV  Antibiotics  :    Anti-infectives (From admission, onward)   Start     Dose/Rate Route Frequency Ordered Stop   08/25/19 1000  remdesivir 100 mg in sodium chloride 0.9 % 100 mL IVPB        100 mg 200 mL/hr over 30 Minutes Intravenous Daily 08/24/19 0357 08/28/19 0956   08/24/19 0400  remdesivir 100 mg in sodium chloride 0.9 % 100 mL IVPB        100 mg 200 mL/hr over 30 Minutes Intravenous Every  30 min 08/24/19 0357 08/24/19 0623       Time Spent in minutes  30   Lala Lund M.D on 08/28/2019 at 11:25 AM  To page go to [email or web address] - password [facility]  [facility] -  Office  [phone]   See all Orders from today for further details    Objective:   Vitals:   08/28/19 0340 08/28/19 0400 08/28/19 0720 08/28/19 0745  BP:  117/80    Pulse: 73 79    Resp: (!) 24 (!) 21    Temp:  97.8 F (36.6 C)    TempSrc:  Oral    SpO2: 91% 92% 92% 93%  Weight:      Height:        Wt Readings from Last 3 Encounters:  08/25/19 92.1 kg     Intake/Output Summary (Last 24 hours) at 08/28/2019 1125 Last data filed at 08/28/2019 0941 Gross per 24 hour  Intake 700 ml  Output 1880 ml  Net -1180 ml     Physical Exam  Awake Alert, No new F.N deficits, mildly anxious affect [place].AT,PERRAL Supple Neck,No JVD, No cervical lymphadenopathy appriciated.  Symmetrical Chest wall movement, Good air movement bilaterally, CTAB RRR,No Gallops, Rubs or new Murmurs, No Parasternal Heave +ve B.Sounds, Abd Soft, No tenderness, No organomegaly appriciated, No rebound - guarding or rigidity. No Cyanosis, Clubbing or edema, No new Rash or bruise     Data Review:    CBC Recent Labs  Lab 08/24/19 0316 08/25/19 0337 08/26/19 0630 08/27/19 0339 08/28/19 0500  WBC 5.8 7.1 4.0 7.2 8.6  HGB 15.1 15.0 14.9 15.4 16.4  HCT 45.3 44.2 44.7 46.1 48.6  PLT 151 186 244 306 337  MCV 95.8 94.8 93.7 93.9 92.2  MCH 31.9 32.2 31.2 31.4 31.1  MCHC 33.3 33.9 33.3 33.4 33.7  RDW 12.4 12.5 12.1 11.9 12.0  LYMPHSABS 0.5* 0.8 0.5* 0.7 1.4  MONOABS 0.2 0.4 0.3 0.3 0.4  EOSABS 0.0 0.0 0.0 0.0 0.0  BASOSABS 0.0 0.0 0.0 0.0 0.0    Chemistries  Recent Labs  Lab 08/24/19 0316 08/25/19 0337 08/26/19 0630 08/27/19 0339 08/27/19 1156 08/28/19 0500  NA 135 136 136 138  --  134*  K 4.0 4.1 4.1 4.3  --  3.4*  CL 98 98 98 98  --  95*  CO2 '26 28 26 28  ' --  26  GLUCOSE 121* 109* 151* 152*  --  101*   BUN 11 16 22* 24*  --  28*  CREATININE 1.25* 1.21 1.02 1.12  --  0.92  CALCIUM 8.4* 8.8*  8.8* 8.7*  --  8.9  AST  --  33 55* 306*  --  121*  ALT  --  22 50* 432*  --  369*  ALKPHOS  --  59 65 82  --  82  BILITOT  --  0.5 0.3 0.9  --  0.8  MG  --   --  2.3 2.3  --  2.5*  INR  --   --   --   --  0.9 0.9     ------------------------------------------------------------------------------------------------------------------ No results for input(s): CHOL, HDL, LDLCALC, TRIG, CHOLHDL, LDLDIRECT in the last 72 hours.  No results found for: HGBA1C ------------------------------------------------------------------------------------------------------------------ No results for input(s): TSH, T4TOTAL, T3FREE, THYROIDAB in the last 72 hours.  Invalid input(s): FREET3  Cardiac Enzymes No results for input(s): CKMB, TROPONINI, MYOGLOBIN in the last 168 hours.  Invalid input(s): CK ------------------------------------------------------------------------------------------------------------------    Component Value Date/Time   BNP 39.9 08/28/2019 0500    Micro Results Recent Results (from the past 240 hour(s))  SARS Coronavirus 2 by RT PCR (hospital order, performed in [facility] hospital lab) Nasopharyngeal Nasopharyngeal Swab     Status: Abnormal   Collection Time: 08/24/19 12:10 AM   Specimen: Nasopharyngeal Swab  Result Value Ref Range Status   SARS Coronavirus 2 POSITIVE (A) NEGATIVE Final    Comment: RESULT CALLED TO, READ BACK BY AND VERIFIED WITH: ADKINS,L AT 0105 ON [id number] BY CHERESNOWSKY,T (NOTE) SARS-CoV-2 target nucleic acids are DETECTED  SARS-CoV-2 RNA is generally detectable in upper respiratory specimens  during the acute phase of infection.  Positive results are indicative  of the presence of the identified virus, but do not rule out bacterial infection or co-infection with other pathogens not detected by the test.  Clinical correlation with patient history and  other  diagnostic information is necessary to determine patient infection status.  The expected result is negative.  Fact Sheet for Patients:   StrictlyIdeas.no   Fact Sheet for Healthcare Providers:   BankingDealers.co.za    This test is not yet approved or cleared by the [place] FDA and  has been authorized for detection and/or diagnosis of SARS-CoV-2 by FDA under an Emergency Use Authorization (EUA).  This EUA will remain in effect (meani ng this test can be used) for the duration of  the COVID-19 declaration under Section 564(b)(1) of the Act, 21 U.S.C. section 360-bbb-3(b)(1), unless the authorization is terminated or revoked sooner.  Performed at [facility], [address] [place], [place] [address]     Radiology Reports CT ANGIO CHEST PE W OR WO CONTRAST  Result Date: 08/27/2019 CLINICAL DATA:  COVID-19 infection, acute respiratory disease, concern for pulmonary embolus, short of breath EXAM: CT ANGIOGRAPHY CHEST WITH CONTRAST TECHNIQUE: Multidetector CT imaging of the chest was performed using the standard protocol during bolus administration of intravenous contrast. Multiplanar CT image reconstructions and MIPs were obtained to evaluate the vascular anatomy. CONTRAST:  4m OMNIPAQUE IOHEXOL 350 MG/ML SOLN COMPARISON:  08/27/2019 FINDINGS: Cardiovascular: This is a technically adequate evaluation of the pulmonary vasculature. No filling defects or pulmonary emboli. The heart is unremarkable without pericardial effusion. Normal caliber of the thoracic aorta. Minimal atherosclerosis of the coronary vasculature. Incidental note is made partial anomalous pulmonary venous return with the left upper lobe pulmonary vein draining into the left brachiocephalic vein. Mediastinum/Nodes: No enlarged mediastinal, hilar, or axillary lymph nodes. Thyroid gland, trachea, and esophagus demonstrate no significant findings. There is a small  hiatal hernia. Lungs/Pleura: Multifocal bilateral ground-glass  airspace disease is identified, greatest in the mid and lower lung zones, consistent with multifocal pneumonia and history of COVID-19. No effusion or pneumothorax. Central airways are patent. Upper Abdomen: No acute abnormality. Musculoskeletal: No acute or destructive bony lesions. Reconstructed images demonstrate no additional findings. Review of the MIP images confirms the above findings. IMPRESSION: 1. No evidence of pulmonary embolus. 2. Multifocal bilateral ground-glass airspace disease, greatest in the mid and lower lung zones, consistent with multifocal pneumonia and history of COVID-19. 3. 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 4. Small hiatal hernia. Electronically Signed   By: Randa Ngo M.D.   On: 08/27/2019 15:52   DG Chest Port 1 View  Result Date: 08/27/2019 CLINICAL DATA:  Shortness of breath EXAM: PORTABLE CHEST 1 VIEW COMPARISON:  08/24/2019 chest radiograph. FINDINGS: Peripheral predominant streaky pulmonary opacities are unchanged. No pneumothorax or pleural effusion. Cardiomediastinal silhouette is unchanged. No acute osseous abnormality. IMPRESSION: Multifocal pneumonia, grossly unchanged. Electronically Signed   By: Primitivo Gauze M.D.   On: 08/27/2019 09:51   DG Chest Portable 1 View  Result Date: 08/24/2019 CLINICAL DATA:  Cough fever COVID positive EXAM: PORTABLE CHEST 1 VIEW COMPARISON:  None. FINDINGS: Streaky bilateral pulmonary opacities. Normal heart size. No pneumothorax or pleural effusion IMPRESSION: Streaky bilateral pulmonary opacities suspicious for bilateral pneumonia. Electronically Signed   By: Donavan Foil M.D.   On: 08/24/2019 00:41   ECHOCARDIOGRAM COMPLETE  Result Date: 08/27/2019    ECHOCARDIOGRAM REPORT   Patient Name:   DEMONTRE PADIN [facility] Date of Exam: 08/27/2019 Medical Rec #:   [id number]                Height:       72.0 in Accession #:    [id number]               Weight:       203.0 lb Date of Birth:  1965-03-15                BSA:          2.144 m Patient Age:    69 years                 BP:           121/80 mmHg Patient Gender: M                        HR:           74 bpm. Exam Location:  Inpatient Procedure: 2D Echo and Intracardiac Opacification Agent Indications:    CHF-Acute Diastolic 951.88 / C16.60  History:        Patient has no prior history of Echocardiogram examinations.                 Signs/Symptoms:Shortness of Breath. Covid 19. GERD.  Sonographer:    Darlina Sicilian RDCS Referring Phys: Graylin Shiver [facility]  Sonographer Comments: Suboptimal apical window and suboptimal subcostal window. IMPRESSIONS  1. Left ventricular ejection fraction, by estimation, is 60 to 65%. The left ventricle has normal function. The left ventricle has no regional wall motion abnormalities. There is mild left ventricular hypertrophy. Left ventricular diastolic parameters were normal.  2. Right ventricular systolic function is normal. The right ventricular size is normal.  3. The mitral valve is normal in structure. No evidence of mitral valve regurgitation. No evidence of mitral stenosis.  4. The aortic valve is normal in structure. Aortic valve regurgitation is not visualized. No aortic stenosis is present.  5. The inferior vena cava is normal in size with greater than 50% respiratory variability, suggesting right atrial pressure of 3 mmHg. FINDINGS  Left Ventricle: Left ventricular ejection fraction, by estimation, is 60 to 65%. The left ventricle has normal function. The left ventricle has no regional wall motion abnormalities. Definity contrast agent was given IV to delineate the left ventricular  endocardial borders. The left ventricular internal cavity size was normal in size. There is mild left ventricular hypertrophy. Left ventricular diastolic parameters were normal. Right Ventricle: The  right ventricular size is normal. No increase in right ventricular wall thickness. Right ventricular systolic function is normal. Left Atrium: Left atrial size was normal in size. Right Atrium: Right atrial size was normal in size. Pericardium: There is no evidence of pericardial effusion. Mitral Valve: The mitral valve is normal in structure. Normal mobility of the mitral valve leaflets. No evidence of mitral valve regurgitation. No evidence of mitral valve stenosis. Tricuspid Valve: The tricuspid valve is normal in structure. Tricuspid valve regurgitation is not demonstrated. No evidence of tricuspid stenosis. Aortic Valve: The aortic valve is normal in structure. Aortic valve regurgitation is not visualized. No aortic stenosis is present. Pulmonic Valve: The pulmonic valve was normal in structure. Pulmonic valve regurgitation is not visualized. No evidence of pulmonic stenosis. Aorta: The aortic root is normal in size and structure. Venous: The inferior vena cava is normal in size with greater than 50% respiratory variability, suggesting right atrial pressure of 3 mmHg. IAS/Shunts: The interatrial septum was not well visualized.  LEFT VENTRICLE PLAX 2D LVIDd:         4.20 cm  Diastology LVIDs:         3.00 cm  LV e' lateral:   4.90 cm/s LV PW:         0.70 cm  LV E/e' lateral: 14.1 LV IVS:        2.45 cm  LV e' medial:    4.57 cm/s LVOT diam:     2.30 cm  LV E/e' medial:  15.1 LV SV:         74 LV SV Index:   34 LVOT Area:     4.15 cm  LEFT ATRIUM         Index LA diam:    2.10 cm 0.98 cm/m  AORTIC VALVE LVOT Vmax:   57.20 cm/s LVOT Vmean:  59.100 cm/s LVOT VTI:    0.178 m  AORTA Ao Root diam: 3.70 cm MITRAL VALVE MV Area (PHT): 2.71 cm    SHUNTS MV Decel Time: 280 msec    Systemic VTI:  0.18 m MV E velocity: 69.20 cm/s  Systemic Diam: 2.30 cm MV A velocity: 65.55 cm/s MV E/A ratio:  1.06 Jenkins Rouge MD Electronically signed by Jenkins Rouge MD Signature Date/Time: 08/27/2019/10:56:13 AM    Final    US  Abdomen Limited RUQ  Result Date: 08/27/2019 CLINICAL DATA:  Elevated liver enzymes.  Inpatient. EXAM: ULTRASOUND ABDOMEN LIMITED RIGHT UPPER QUADRANT COMPARISON:  None. FINDINGS: Unable to turn patient into decubitus position due to restraints, limiting assessment. Gallbladder: No gallstones or wall thickening visualized. No sonographic Murphy sign noted by sonographer. Common bile duct: CBD not discretely visualized. No evidence of intrahepatic biliary ductal dilatation. Liver: Liver parenchyma is top-normal in echogenicity. No liver surface irregularity. No liver masses. Portal vein is patent on color Doppler imaging with  normal direction of blood flow towards the liver. Other: None. IMPRESSION: Limited scan, see comments. No acute abnormality. No cholelithiasis. Electronically Signed   By: Ilona Sorrel M.D.   On: 08/27/2019 18:31

## 2019-08-29 LAB — D-DIMER, QUANTITATIVE: D-Dimer, Quant: 0.71 ug/mL-FEU — ABNORMAL HIGH (ref 0.00–0.50)

## 2019-08-29 LAB — CBC WITH DIFFERENTIAL/PLATELET
Abs Immature Granulocytes: 0.13 10*3/uL — ABNORMAL HIGH (ref 0.00–0.07)
Basophils Absolute: 0 10*3/uL (ref 0.0–0.1)
Basophils Relative: 1 %
Eosinophils Absolute: 0 10*3/uL (ref 0.0–0.5)
Eosinophils Relative: 0 %
HCT: 50 % (ref 39.0–52.0)
Hemoglobin: 17.4 g/dL — ABNORMAL HIGH (ref 13.0–17.0)
Immature Granulocytes: 2 %
Lymphocytes Relative: 14 %
Lymphs Abs: 1.2 10*3/uL (ref 0.7–4.0)
MCH: 31.9 pg (ref 26.0–34.0)
MCHC: 34.8 g/dL (ref 30.0–36.0)
MCV: 91.6 fL (ref 80.0–100.0)
Monocytes Absolute: 0.4 10*3/uL (ref 0.1–1.0)
Monocytes Relative: 4 %
Neutro Abs: 7.1 10*3/uL (ref 1.7–7.7)
Neutrophils Relative %: 79 %
Platelets: 383 10*3/uL (ref 150–400)
RBC: 5.46 MIL/uL (ref 4.22–5.81)
RDW: 11.9 % (ref 11.5–15.5)
WBC: 8.9 10*3/uL (ref 4.0–10.5)
nRBC: 0 % (ref 0.0–0.2)

## 2019-08-29 LAB — COMPREHENSIVE METABOLIC PANEL
ALT: 291 U/L — ABNORMAL HIGH (ref 0–44)
AST: 60 U/L — ABNORMAL HIGH (ref 15–41)
Albumin: 3.3 g/dL — ABNORMAL LOW (ref 3.5–5.0)
Alkaline Phosphatase: 81 U/L (ref 38–126)
Anion gap: 13 (ref 5–15)
BUN: 29 mg/dL — ABNORMAL HIGH (ref 6–20)
CO2: 26 mmol/L (ref 22–32)
Calcium: 8.8 mg/dL — ABNORMAL LOW (ref 8.9–10.3)
Chloride: 94 mmol/L — ABNORMAL LOW (ref 98–111)
Creatinine, Ser: 0.99 mg/dL (ref 0.61–1.24)
GFR calc Af Amer: >60 mL/min (ref >60)
GFR calc non Af Amer: >60 mL/min (ref >60)
Glucose, Bld: 116 mg/dL — ABNORMAL HIGH (ref 70–99)
Potassium: 4.1 mmol/L (ref 3.5–5.1)
Sodium: 133 mmol/L — ABNORMAL LOW (ref 135–145)
Total Bilirubin: 0.9 mg/dL (ref 0.3–1.2)
Total Protein: 6.6 g/dL (ref 6.5–8.1)

## 2019-08-29 LAB — PROTIME-INR
INR: 1 (ref 0.8–1.2)
Prothrombin Time: 12.8 seconds (ref 11.4–15.2)

## 2019-08-29 LAB — C-REACTIVE PROTEIN: CRP: 0.9 mg/dL (ref <1.0)

## 2019-08-29 LAB — BRAIN NATRIURETIC PEPTIDE: B Natriuretic Peptide: 34.7 pg/mL (ref 0.0–100.0)

## 2019-08-29 LAB — MAGNESIUM: Magnesium: 2.6 mg/dL — ABNORMAL HIGH (ref 1.7–2.4)

## 2019-08-29 MED ORDER — POTASSIUM CHLORIDE 20 MEQ PO PACK
20.0000 meq | PACK | Freq: Once | ORAL | Status: AC
  Administered 2019-08-29: 20 meq via ORAL
  Filled 2019-08-29: qty 1

## 2019-08-29 MED ORDER — FUROSEMIDE 10 MG/ML IJ SOLN
40.0000 mg | Freq: Once | INTRAMUSCULAR | Status: AC
  Administered 2019-08-29: 40 mg via INTRAVENOUS
  Filled 2019-08-29: qty 4

## 2019-08-29 NOTE — Progress Notes (Signed)
PROGRESS NOTE                                                                                                                                                                                                             Patient Demographics:    Samuel [name], is a 54 y.o. male, DOB - 1966-01-05, KHT:[id number]  Outpatient Primary MD for the patient is Patient, [name]    LOS - 4  Admit date - 08/24/2019    Chief Complaint  Patient presents with   Cough       Brief Narrative Samuel [name] is a 54 y.o. male with no significant past medical history presents to the ER at bedside at [facility] with complaint of shortness of breath.  Patient started having upper respiratory tract-like symptoms and headache about a week ago and was diagnosed with COVID-19 infection.  He continued to get progressively short of breath and presented to the ER with severe hypoxia requiring 12 L of oxygen and was admitted to the hospital subsequently.   Subjective:   Patient in bed, appears comfortable, denies any headache, no fever, no chest pain or pressure, imporving shortness of breath , no abdominal pain. No focal weakness.    Assessment  & Plan :     Acute Hypoxic Resp. Failure due to Acute Covid 19 Viral Pneumonitis during the ongoing 2020 Covid 19 Pandemic - he has severe disease and is unfortunately unvaccinated.  He was promptly placed on steroids, remdesivir and received Actemra on the day of admission.  He had shown good improvement however evening of 08/26/2019 after going to the bed he abruptly became short of breath and developed a panic attack, he has been placed on 10-12 L of oxygen.  On exam he has a few rails for which she will be diuresed and monitored closely.  This could be waxing and waning COVID -19 infection although his inflammatory markers have stabilized.  Echocardiogram was non acute & -ve CTA of the  chest.  Encouraged the patient to sit up in chair in the daytime use I-S and flutter valve for pulmonary toiletry and then prone in bed when at night.  Will advance activity and titrate down oxygen as possible.  SpO2: 93 % O2 Flow Rate (L/min): 10 L/min    Recent Labs  Lab 08/24/19 0010  08/24/19 0316 08/25/19 0337 08/26/19 0630 08/27/19 0339 08/27/19 1156 08/28/19 0500 08/29/19 0256  WBC  --    < > 7.1 4.0 7.2  --  8.6 8.9  CRP  --   --  10.7* 6.6* 3.4*  --  1.6* 0.9  DDIMER  --   --  0.90* 0.57* 0.76*  --  0.73* 0.71*  BNP  --   --   --  32.2 128.7*  --  39.9 34.7  PROCALCITON  --   --  <0.10 <0.10 <0.10  --   --   --   AST  --   --  33 55* 306*  --  121* 60*  ALT  --   --  22 50* 432*  --  369* 291*  ALKPHOS  --   --  59 65 82  --  82 81  BILITOT  --   --  0.5 0.3 0.9  --  0.8 0.9  ALBUMIN  --   --  3.0* 3.0* 3.0*  --  3.2* 3.3*  INR  --   --   --   --   --  0.9 0.9 1.0  SARSCOV2NAA POSITIVE*  --   --   --   --   --   --   --    < > = values in this interval not displayed.       Rising LFTs.  Due to combination of COVID-19 infection, remdesivir and Actemra use.  Asymptomatic with stable right upper quadrant ultrasound and improving LFT trend.   GERD - PPI    Condition - Guarded  Family Communication  :  Wife [phone] On 08/27/2019 at 11:15 AM, 08/28/19, 08/29/19  Code Status :  Full  Consults  :  None  Procedures  :    TTE  -  1. Left ventricular ejection fraction, by estimation, is 60 to 65%. The left ventricle has normal function. The left ventricle has no regional wall motion abnormalities. There is mild left ventricular hypertrophy. Left ventricular diastolic parameters were normal.  2. Right ventricular systolic function is normal. The right ventricular size is normal.  3. The mitral valve is normal in structure. No evidence of mitral valve regurgitation. No evidence of mitral stenosis.  4. The aortic valve is normal in structure. Aortic valve regurgitation  is not visualized. No aortic stenosis is present.  5. The inferior vena cava is normal in size with greater than 50% respiratory variability, suggesting right atrial pressure of 3 mmHg.    CTA  - No PE  RUQ [place] - Non acute   PUD Prophylaxis : PPI  Disposition Plan  :    Status is: Inpatient  Remains inpatient appropriate because:IV treatments appropriate due to intensity of illness or inability to take PO   Dispo: The patient is from: Home              Anticipated d/c is to: Home              Anticipated d/c date is: > 3 days              Patient currently is not medically stable to d/c.   DVT Prophylaxis  :  Lovenox   Lab Results  Component Value Date   PLT 383 08/29/2019    Diet :  Diet Order            Diet regular Room service appropriate? Yes; Fluid consistency: Thin  Diet effective now  Inpatient Medications  Scheduled Meds:   (feeding supplement) PROSource Plus  30 mL Oral TID BM   clonazePAM  1 mg Oral Daily   enoxaparin (LOVENOX) injection  40 mg Subcutaneous Daily   feeding supplement (ENSURE ENLIVE)  237 mL Oral TID BM   Ipratropium-Albuterol  1 puff Inhalation TID   methylPREDNISolone (SOLU-MEDROL) injection  50 mg Intravenous Daily   multivitamin with minerals  1 tablet Oral Daily   pantoprazole  40 mg Oral Daily   Continuous Infusions:  PRN Meds:.acetaminophen, chlorpheniramine-HYDROcodone, ondansetron **OR** ondansetron (ZOFRAN) IV  Antibiotics  :    Anti-infectives (From admission, onward)   Start     Dose/Rate Route Frequency Ordered Stop   08/25/19 1000  remdesivir 100 mg in sodium chloride 0.9 % 100 mL IVPB        100 mg 200 mL/hr over 30 Minutes Intravenous Daily 08/24/19 0357 08/28/19 0956   08/24/19 0400  remdesivir 100 mg in sodium chloride 0.9 % 100 mL IVPB        100 mg 200 mL/hr over 30 Minutes Intravenous Every 30 min 08/24/19 0357 08/24/19 0623       Time Spent in minutes  30   Susa RaringPrashant Takeya Marquis  M.D on 08/29/2019 at 12:04 PM  To page go to [email or web address] - password [email or web address]  [facility] -  Office  [phone]   See all Orders from today for further details    Objective:   Vitals:   08/29/19 0110 08/29/19 0121 08/29/19 0413 08/29/19 0745  BP:  124/85 125/86   Pulse: 76 79 88   Resp: (!) 27 (!) 23 19   Temp:  97.7 F (36.5 C) 97.8 F (36.6 C)   TempSrc:  Oral Oral   SpO2: 96% 92% 93% 93%  Weight:      Height:        Wt Readings from Last 3 Encounters:  08/25/19 92.1 kg     Intake/Output Summary (Last 24 hours) at 08/29/2019 1204 Last data filed at 08/29/2019 1026 Gross per 24 hour  Intake 680 ml  Output 2525 ml  Net -1845 ml     Physical Exam  Awake Alert, No new F.N deficits, Normal affect [place].AT,PERRAL Supple Neck,No JVD, No cervical lymphadenopathy appriciated.  Symmetrical Chest wall movement, Good air movement bilaterally, +ve rales RRR,No Gallops, Rubs or new Murmurs, No Parasternal Heave +ve B.Sounds, Abd Soft, No tenderness, No organomegaly appriciated, No rebound - guarding or rigidity. No Cyanosis, Clubbing or edema, No new Rash or bruise    Data Review:    CBC Recent Labs  Lab 08/25/19 0337 08/26/19 0630 08/27/19 0339 08/28/19 0500 08/29/19 0256  WBC 7.1 4.0 7.2 8.6 8.9  HGB 15.0 14.9 15.4 16.4 17.4*  HCT 44.2 44.7 46.1 48.6 50.0  PLT 186 244 306 337 383  MCV 94.8 93.7 93.9 92.2 91.6  MCH 32.2 31.2 31.4 31.1 31.9  MCHC 33.9 33.3 33.4 33.7 34.8  RDW 12.5 12.1 11.9 12.0 11.9  LYMPHSABS 0.8 0.5* 0.7 1.4 1.2  MONOABS 0.4 0.3 0.3 0.4 0.4  EOSABS 0.0 0.0 0.0 0.0 0.0  BASOSABS 0.0 0.0 0.0 0.0 0.0    Chemistries  Recent Labs  Lab 08/25/19 0337 08/26/19 0630 08/27/19 0339 08/27/19 1156 08/28/19 0500 08/29/19 0256  NA 136 136 138  --  134* 133*  K 4.1 4.1 4.3  --  3.4* 4.1  CL 98 98 98  --  95* 94*  CO2 28 26 28   --  26 26  GLUCOSE 109* 151* 152*  --  101* 116*  BUN 16 22* 24*  --  28* 29*  CREATININE 1.21 1.02 1.12  --   0.92 0.99  CALCIUM 8.8* 8.8* 8.7*  --  8.9 8.8*  AST 33 55* 306*  --  121* 60*  ALT 22 50* 432*  --  369* 291*  ALKPHOS 59 65 82  --  82 81  BILITOT 0.5 0.3 0.9  --  0.8 0.9  MG  --  2.3 2.3  --  2.5* 2.6*  INR  --   --   --  0.9 0.9 1.0     ------------------------------------------------------------------------------------------------------------------ No results for input(s): CHOL, HDL, LDLCALC, TRIG, CHOLHDL, LDLDIRECT in the last 72 hours.  No results found for: HGBA1C ------------------------------------------------------------------------------------------------------------------ No results for input(s): TSH, T4TOTAL, T3FREE, THYROIDAB in the last 72 hours.  Invalid input(s): FREET3  Cardiac Enzymes No results for input(s): CKMB, TROPONINI, MYOGLOBIN in the last 168 hours.  Invalid input(s): CK ------------------------------------------------------------------------------------------------------------------    Component Value Date/Time   BNP 34.7 08/29/2019 0256    Micro Results Recent Results (from the past 240 hour(s))  SARS Coronavirus 2 by RT PCR (hospital order, performed in [facility] hospital lab) Nasopharyngeal Nasopharyngeal Swab     Status: Abnormal   Collection Time: 08/24/19 12:10 AM   Specimen: Nasopharyngeal Swab  Result Value Ref Range Status   SARS Coronavirus 2 POSITIVE (A) NEGATIVE Final    Comment: RESULT CALLED TO, READ BACK BY AND VERIFIED WITH: ADKINS,L AT 0105 ON [id number] BY CHERESNOWSKY,T (NOTE) SARS-CoV-2 target nucleic acids are DETECTED  SARS-CoV-2 RNA is generally detectable in upper respiratory specimens  during the acute phase of infection.  Positive results are indicative  of the presence of the identified virus, but do not rule out bacterial infection or co-infection with other pathogens not detected by the test.  Clinical correlation with patient history and  other diagnostic information is necessary to determine patient infection  status.  The expected result is negative.  Fact Sheet for Patients:   BoilerBrush.com.cy   Fact Sheet for Healthcare Providers:   https://pope.com/    This test is not yet approved or cleared by the [place] FDA and  has been authorized for detection and/or diagnosis of SARS-CoV-2 by FDA under an Emergency Use Authorization (EUA).  This EUA will remain in effect (meani ng this test can be used) for the duration of  the COVID-19 declaration under Section 564(b)(1) of the Act, 21 U.S.C. section 360-bbb-3(b)(1), unless the authorization is terminated or revoked sooner.  Performed at [facility], [address] [place], [place] [address]     Radiology Reports CT ANGIO CHEST PE W OR WO CONTRAST  Result Date: 08/27/2019 CLINICAL DATA:  COVID-19 infection, acute respiratory disease, concern for pulmonary embolus, short of breath EXAM: CT ANGIOGRAPHY CHEST WITH CONTRAST TECHNIQUE: Multidetector CT imaging of the chest was performed using the standard protocol during bolus administration of intravenous contrast. Multiplanar CT image reconstructions and MIPs were obtained to evaluate the vascular anatomy. CONTRAST:  37mL OMNIPAQUE IOHEXOL 350 MG/ML SOLN COMPARISON:  08/27/2019 FINDINGS: Cardiovascular: This is a technically adequate evaluation of the pulmonary vasculature. No filling defects or pulmonary emboli. The heart is unremarkable without pericardial effusion. Normal caliber of the thoracic aorta. Minimal atherosclerosis of the coronary vasculature. Incidental note is made partial anomalous pulmonary venous return with the left upper lobe pulmonary vein draining into the left brachiocephalic vein. Mediastinum/Nodes: No enlarged mediastinal, hilar, or axillary  lymph nodes. Thyroid gland, trachea, and esophagus demonstrate no significant findings. There is a small hiatal hernia. Lungs/Pleura: Multifocal bilateral ground-glass  airspace disease is identified, greatest in the mid and lower lung zones, consistent with multifocal pneumonia and history of COVID-19. No effusion or pneumothorax. Central airways are patent. Upper Abdomen: No acute abnormality. Musculoskeletal: No acute or destructive bony lesions. Reconstructed images demonstrate no additional findings. Review of the MIP images confirms the above findings. IMPRESSION: 1. No evidence of pulmonary embolus. 2. Multifocal bilateral ground-glass airspace disease, greatest in the mid and lower lung zones, consistent with multifocal pneumonia and history of COVID-19. 3. 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 4. Small hiatal hernia. Electronically Signed   By: Sharlet Salina M.D.   On: 08/27/2019 15:52   DG Chest Port 1 View  Result Date: 08/27/2019 CLINICAL DATA:  Shortness of breath EXAM: PORTABLE CHEST 1 VIEW COMPARISON:  08/24/2019 chest radiograph. FINDINGS: Peripheral predominant streaky pulmonary opacities are unchanged. No pneumothorax or pleural effusion. Cardiomediastinal silhouette is unchanged. No acute osseous abnormality. IMPRESSION: Multifocal pneumonia, grossly unchanged. Electronically Signed   By: Stana Bunting M.D.   On: 08/27/2019 09:51   DG Chest Portable 1 View  Result Date: 08/24/2019 CLINICAL DATA:  Cough fever COVID positive EXAM: PORTABLE CHEST 1 VIEW COMPARISON:  None. FINDINGS: Streaky bilateral pulmonary opacities. Normal heart size. No pneumothorax or pleural effusion IMPRESSION: Streaky bilateral pulmonary opacities suspicious for bilateral pneumonia. Electronically Signed   By: Jasmine Pang M.D.   On: 08/24/2019 00:41   ECHOCARDIOGRAM COMPLETE  Result Date: 08/27/2019    ECHOCARDIOGRAM REPORT   Patient Name:   Samuel [name] [facility] Date of Exam: 08/27/2019 Medical Rec #:  [id number]                Height:       72.0 in Accession #:     [id number]               Weight:       203.0 lb Date of Birth:  [birth year]                BSA:          2.144 m Patient Age:    54 years                 BP:           121/80 mmHg Patient Gender: M                        HR:           74 bpm. Exam Location:  Inpatient Procedure: 2D Echo and Intracardiac Opacification Agent Indications:    CHF-Acute Diastolic 428.31 / I50.31  History:        Patient has no prior history of Echocardiogram examinations.                 Signs/Symptoms:Shortness of Breath. Covid 19. GERD.  Sonographer:    Leta Jungling RDCS Referring Phys: Heide Scales [facility]  Sonographer Comments: Suboptimal apical window and suboptimal subcostal window. IMPRESSIONS  1. Left ventricular ejection fraction, by estimation, is 60 to 65%. The left ventricle has normal function. The left ventricle has no regional wall motion abnormalities. There is mild left ventricular hypertrophy. Left ventricular diastolic parameters were normal.  2. Right ventricular systolic function is normal. The right ventricular size is  normal.  3. The mitral valve is normal in structure. No evidence of mitral valve regurgitation. No evidence of mitral stenosis.  4. The aortic valve is normal in structure. Aortic valve regurgitation is not visualized. No aortic stenosis is present.  5. The inferior vena cava is normal in size with greater than 50% respiratory variability, suggesting right atrial pressure of 3 mmHg. FINDINGS  Left Ventricle: Left ventricular ejection fraction, by estimation, is 60 to 65%. The left ventricle has normal function. The left ventricle has no regional wall motion abnormalities. Definity contrast agent was given IV to delineate the left ventricular  endocardial borders. The left ventricular internal cavity size was normal in size. There is mild left ventricular hypertrophy. Left ventricular diastolic parameters were normal. Right Ventricle: The right ventricular size is normal. No increase in right  ventricular wall thickness. Right ventricular systolic function is normal. Left Atrium: Left atrial size was normal in size. Right Atrium: Right atrial size was normal in size. Pericardium: There is no evidence of pericardial effusion. Mitral Valve: The mitral valve is normal in structure. Normal mobility of the mitral valve leaflets. No evidence of mitral valve regurgitation. No evidence of mitral valve stenosis. Tricuspid Valve: The tricuspid valve is normal in structure. Tricuspid valve regurgitation is not demonstrated. No evidence of tricuspid stenosis. Aortic Valve: The aortic valve is normal in structure. Aortic valve regurgitation is not visualized. No aortic stenosis is present. Pulmonic Valve: The pulmonic valve was normal in structure. Pulmonic valve regurgitation is not visualized. No evidence of pulmonic stenosis. Aorta: The aortic root is normal in size and structure. Venous: The inferior vena cava is normal in size with greater than 50% respiratory variability, suggesting right atrial pressure of 3 mmHg. IAS/Shunts: The interatrial septum was not well visualized.  LEFT VENTRICLE PLAX 2D LVIDd:         4.20 cm  Diastology LVIDs:         3.00 cm  LV e' lateral:   4.90 cm/s LV PW:         0.70 cm  LV E/e' lateral: 14.1 LV IVS:        2.45 cm  LV e' medial:    4.57 cm/s LVOT diam:     2.30 cm  LV E/e' medial:  15.1 LV SV:         74 LV SV Index:   34 LVOT Area:     4.15 cm  LEFT ATRIUM         Index LA diam:    2.10 cm 0.98 cm/m  AORTIC VALVE LVOT Vmax:   57.20 cm/s LVOT Vmean:  59.100 cm/s LVOT VTI:    0.178 m  AORTA Ao Root diam: 3.70 cm MITRAL VALVE MV Area (PHT): 2.71 cm    SHUNTS MV Decel Time: 280 msec    Systemic VTI:  0.18 m MV E velocity: 69.20 cm/s  Systemic Diam: 2.30 cm MV A velocity: 65.55 cm/s MV E/A ratio:  1.06 Charlton Haws MD Electronically signed by Charlton Haws MD Signature Date/Time: 08/27/2019/10:56:13 AM    Final    US Abdomen Limited RUQ  Result Date: 08/27/2019 CLINICAL DATA:   Elevated liver enzymes.  Inpatient. EXAM: ULTRASOUND ABDOMEN LIMITED RIGHT UPPER QUADRANT COMPARISON:  None. FINDINGS: Unable to turn patient into decubitus position due to restraints, limiting assessment. Gallbladder: No gallstones or wall thickening visualized. No sonographic Murphy sign noted by sonographer. Common bile duct: CBD not discretely visualized. No evidence of intrahepatic biliary ductal dilatation. Liver:  Liver parenchyma is top-normal in echogenicity. No liver surface irregularity. No liver masses. Portal vein is patent on color Doppler imaging with normal direction of blood flow towards the liver. Other: None. IMPRESSION: Limited scan, see comments. No acute abnormality. No cholelithiasis. Electronically Signed   By: Delbert Phenix M.D.   On: 08/27/2019 18:31

## 2019-08-30 ENCOUNTER — Inpatient Hospital Stay (HOSPITAL_COMMUNITY): Payer: 59

## 2019-08-30 LAB — CBC WITH DIFFERENTIAL/PLATELET
Abs Immature Granulocytes: 0.24 10*3/uL — ABNORMAL HIGH (ref 0.00–0.07)
Basophils Absolute: 0 10*3/uL (ref 0.0–0.1)
Basophils Relative: 0 %
Eosinophils Absolute: 0.1 10*3/uL (ref 0.0–0.5)
Eosinophils Relative: 1 %
HCT: 50 % (ref 39.0–52.0)
Hemoglobin: 17.4 g/dL — ABNORMAL HIGH (ref 13.0–17.0)
Immature Granulocytes: 2 %
Lymphocytes Relative: 6 %
Lymphs Abs: 1 10*3/uL (ref 0.7–4.0)
MCH: 31.1 pg (ref 26.0–34.0)
MCHC: 34.8 g/dL (ref 30.0–36.0)
MCV: 89.4 fL (ref 80.0–100.0)
Monocytes Absolute: 0.4 10*3/uL (ref 0.1–1.0)
Monocytes Relative: 3 %
Neutro Abs: 13.3 10*3/uL — ABNORMAL HIGH (ref 1.7–7.7)
Neutrophils Relative %: 88 %
Platelets: 429 10*3/uL — ABNORMAL HIGH (ref 150–400)
RBC: 5.59 MIL/uL (ref 4.22–5.81)
RDW: 11.7 % (ref 11.5–15.5)
WBC: 15.1 10*3/uL — ABNORMAL HIGH (ref 4.0–10.5)
nRBC: 0 % (ref 0.0–0.2)

## 2019-08-30 LAB — URINALYSIS, ROUTINE W REFLEX MICROSCOPIC
Bilirubin Urine: NEGATIVE
Glucose, UA: NEGATIVE mg/dL
Hgb urine dipstick: NEGATIVE
Ketones, ur: NEGATIVE mg/dL
Leukocytes,Ua: NEGATIVE
Nitrite: NEGATIVE
Protein, ur: NEGATIVE mg/dL
Specific Gravity, Urine: 1.018 (ref 1.005–1.030)
pH: 6 (ref 5.0–8.0)

## 2019-08-30 LAB — COMPREHENSIVE METABOLIC PANEL
ALT: 201 U/L — ABNORMAL HIGH (ref 0–44)
AST: 43 U/L — ABNORMAL HIGH (ref 15–41)
Albumin: 3.4 g/dL — ABNORMAL LOW (ref 3.5–5.0)
Alkaline Phosphatase: 73 U/L (ref 38–126)
Anion gap: 13 (ref 5–15)
BUN: 29 mg/dL — ABNORMAL HIGH (ref 6–20)
CO2: 25 mmol/L (ref 22–32)
Calcium: 8.9 mg/dL (ref 8.9–10.3)
Chloride: 94 mmol/L — ABNORMAL LOW (ref 98–111)
Creatinine, Ser: 1.1 mg/dL (ref 0.61–1.24)
GFR calc Af Amer: >60 mL/min (ref >60)
GFR calc non Af Amer: >60 mL/min (ref >60)
Glucose, Bld: 115 mg/dL — ABNORMAL HIGH (ref 70–99)
Potassium: 3.6 mmol/L (ref 3.5–5.1)
Sodium: 132 mmol/L — ABNORMAL LOW (ref 135–145)
Total Bilirubin: 1 mg/dL (ref 0.3–1.2)
Total Protein: 6.7 g/dL (ref 6.5–8.1)

## 2019-08-30 LAB — PROTIME-INR
INR: 1 (ref 0.8–1.2)
Prothrombin Time: 13 seconds (ref 11.4–15.2)

## 2019-08-30 LAB — OSMOLALITY: Osmolality: 283 mOsm/kg (ref 275–295)

## 2019-08-30 LAB — C-REACTIVE PROTEIN: CRP: 0.5 mg/dL (ref <1.0)

## 2019-08-30 LAB — SODIUM, URINE, RANDOM: Sodium, Ur: <10 mmol/L

## 2019-08-30 LAB — OSMOLALITY, URINE: Osmolality, Ur: 652 mOsm/kg (ref 300–900)

## 2019-08-30 LAB — D-DIMER, QUANTITATIVE: D-Dimer, Quant: 0.56 ug/mL-FEU — ABNORMAL HIGH (ref 0.00–0.50)

## 2019-08-30 LAB — URIC ACID: Uric Acid, Serum: 6.6 mg/dL (ref 3.7–8.6)

## 2019-08-30 LAB — BRAIN NATRIURETIC PEPTIDE: B Natriuretic Peptide: 43.9 pg/mL (ref 0.0–100.0)

## 2019-08-30 LAB — MAGNESIUM: Magnesium: 2.5 mg/dL — ABNORMAL HIGH (ref 1.7–2.4)

## 2019-08-30 LAB — PROCALCITONIN: Procalcitonin: <0.1 ng/mL

## 2019-08-30 LAB — CREATININE, URINE, RANDOM: Creatinine, Urine: 113.5 mg/dL

## 2019-08-30 MED ORDER — BOOST / RESOURCE BREEZE PO LIQD CUSTOM
1.0000 | Freq: Three times a day (TID) | ORAL | Status: DC
  Administered 2019-09-01 – 2019-09-05 (×2): 1 via ORAL

## 2019-08-30 NOTE — TOC Initial Note (Signed)
Transition of Care [facility]) - Initial/Assessment Note    Patient Details  Name: Samuel [name] MRN: [id number] Date of Birth: February 04, 1965  Transition of Care [facility] CM/SW Contact:    Reola Mosher Transition of Care Supervisor Phone Number: [phone] 08/30/2019, 11:08 AM  Clinical Narrative:                 Patient lives at home with spouse; independent of all of his ADL's; no DME; No PCP; CM talked to patient concerning the importance of having a PCP; he requested that I talk to his spouse Inetta Fermo; TCT Inetta Fermo, she requested that I call Dr Feliciana Rossetti for primary care. Follow up hospital apt made for Sept 29, 2021 at 2pm; Has private insurance with [facility] with prescription drug coverage; pharmacy of choice is [facility]; patient continues to work full time- wife is requesting a Paediatric nurse at discharge. CM will continue to follow for progression of care.  Expected Discharge Plan: Home/Self Care Barriers to Discharge: No Barriers Identified   Patient Goals and CMS Choice Patient states their goals for this hospitalization and ongoing recovery are:: to go back home      Expected Discharge Plan and Services Expected Discharge Plan: Home/Self Care In-house Referral: NA Discharge Planning Services: CM Consult   Living arrangements for the past 2 months: Single Family Home                                      Prior Living Arrangements/Services Living arrangements for the past 2 months: Single Family Home   Patient language and need for interpreter reviewed:: No Do you feel safe going back to the place where you live?: Yes      Need for Family Participation in Patient Care: No (Comment) Care giver support system in place?: Yes (comment)   Criminal Activity/Legal Involvement Pertinent to Current Situation/Hospitalization: No - Comment as needed  Activities of Daily Living Home Assistive Devices/Equipment: None ADL  Screening (condition at time of admission) Patient's cognitive ability adequate to safely complete daily activities?: Yes Is the patient deaf or have difficulty hearing?: No Does the patient have difficulty seeing, even when wearing glasses/contacts?: No Does the patient have difficulty concentrating, remembering, or making decisions?: No Patient able to express need for assistance with ADLs?: Yes Does the patient have difficulty dressing or bathing?: No Independently performs ADLs?: Yes (appropriate for developmental age) Does the patient have difficulty walking or climbing stairs?: No Weakness of Legs: None Weakness of Arms/Hands: None  Permission Sought/Granted Permission sought to share information with : Case Manager Permission granted to share information with : Yes, Verbal Permission Granted  Share Information with NAME: Spouse tina  Permission granted to share info w AGENCY: HHC/ DME agency        Emotional Assessment Appearance:: Developmentally appropriate Attitude/Demeanor/Rapport: Gracious Affect (typically observed): Accepting Orientation: : Oriented to Self, Oriented to Place, Oriented to  Time, Oriented to Situation Alcohol / Substance Use: Not Applicable Psych Involvement: No (comment)  Admission diagnosis:  Hypoxia [R09.02] Pneumonia due to COVID-19 virus [U07.1, J12.82] Acute respiratory disease due to COVID-19 virus [U07.1, J06.9] Patient Active Problem List   Diagnosis Date Noted  . Acute respiratory disease due to COVID-19 virus 08/24/2019   PCP:  Patient, [name] Pharmacy:   [facility] [id number] - [place], [place] - [address] AT [address] OF  [address] [place] [address] Phone: [phone] Fax: [phone]     Social Determinants of Health (SDOH) Interventions    Readmission Risk Interventions No flowsheet data found.

## 2019-08-30 NOTE — Progress Notes (Signed)
PROGRESS NOTE                                                                                                                                                                                                             Patient Demographics:    Samuel [name], is a 54 y.o. male, DOB - 1965-12-12, WNU:[id number]  Outpatient Primary MD for the patient is Patient, [name]    LOS - 5  Admit date - 08/24/2019    Chief Complaint  Patient presents with  . Cough       Brief Narrative Samuel [name] is a 54 y.o. male with no significant past medical history presents to the ER at bedside at [facility] with complaint of shortness of breath.  Patient started having upper respiratory tract-like symptoms and headache about a week ago and was diagnosed with COVID-19 infection.  He continued to get progressively short of breath and presented to the ER with severe hypoxia requiring 12 L of oxygen and was admitted to the hospital subsequently.   Subjective:   Patient in bed, appears comfortable, denies any headache, no fever, no chest pain or pressure, improved shortness of breath , no abdominal pain. No focal weakness.   Assessment  & Plan :     Acute Hypoxic Resp. Failure due to Acute Covid 19 Viral Pneumonitis during the ongoing 2020 Covid 19 Pandemic - he has severe disease and is unfortunately unvaccinated.  He was promptly placed on steroids, remdesivir and received Actemra on the day of admission.  He had shown good improvement however evening of 08/26/2019 after going to the bed he abruptly became short of breath and developed a panic attack, he has been placed on 10-12 L of oxygen.  On exam he has a few rails for which she will be diuresed and monitored closely.  This could be waxing and waning COVID -19 infection although his inflammatory markers have stabilized.  Echocardiogram was non acute & - ve CTA of the  chest.  Encouraged the patient to sit up in chair in the daytime use I-S and flutter valve for pulmonary toiletry and then prone in bed when at night.  Will advance activity and titrate down oxygen as possible.  SpO2: 93 % O2 Flow Rate (L/min): 15 L/min    Recent Labs  Lab 08/24/19 0010  08/24/19 0316 08/25/19 0337 08/25/19 0337 08/26/19 0630 08/27/19 0339 08/27/19 1156 08/28/19 0500 08/29/19 0256 08/30/19 0715  WBC  --    < > 7.1   < > 4.0 7.2  --  8.6 8.9 15.1*  CRP  --   --  10.7*   < > 6.6* 3.4*  --  1.6* 0.9 0.5  DDIMER  --   --  0.90*   < > 0.57* 0.76*  --  0.73* 0.71* 0.56*  BNP  --   --   --   --  32.2 128.7*  --  39.9 34.7 43.9  PROCALCITON  --   --  <0.10  --  <0.10 <0.10  --   --   --   --   AST  --   --  33   < > 55* 306*  --  121* 60* 43*  ALT  --   --  22   < > 50* 432*  --  369* 291* 201*  ALKPHOS  --   --  59   < > 65 82  --  82 81 73  BILITOT  --   --  0.5   < > 0.3 0.9  --  0.8 0.9 1.0  ALBUMIN  --   --  3.0*   < > 3.0* 3.0*  --  3.2* 3.3* 3.4*  INR  --   --   --   --   --   --  0.9 0.9 1.0 1.0  SARSCOV2NAA POSITIVE*  --   --   --   --   --   --   --   --   --    < > = values in this interval not displayed.       Rising LFTs.  Due to combination of COVID-19 infection, remdesivir and Actemra use.  Asymptomatic with stable right upper quadrant ultrasound and improving LFT trend.   GERD - PPI    Condition - Guarded  Family Communication  :  Wife [phone] On 08/27/2019 at 11:15 AM, 08/28/19, 08/29/19, 08/30/19  Code Status :  Full  Consults  :  None  Procedures  :    TTE  -  1. Left ventricular ejection fraction, by estimation, is 60 to 65%. The left ventricle has normal function. The left ventricle has no regional wall motion abnormalities. There is mild left ventricular hypertrophy. Left ventricular diastolic parameters were normal.  2. Right ventricular systolic function is normal. The right ventricular size is normal.  3. The mitral valve is normal  in structure. No evidence of mitral valve regurgitation. No evidence of mitral stenosis.  4. The aortic valve is normal in structure. Aortic valve regurgitation is not visualized. No aortic stenosis is present.  5. The inferior vena cava is normal in size with greater than 50% respiratory variability, suggesting right atrial pressure of 3 mmHg.    CTA  - No PE  RUQ US - Non acute   PUD Prophylaxis : PPI  Disposition Plan  :    Status is: Inpatient  Remains inpatient appropriate because:IV treatments appropriate due to intensity of illness or inability to take PO   Dispo: The patient is from: Home              Anticipated d/c is to: Home              Anticipated d/c date is: > 3 days  Patient currently is not medically stable to d/c.   DVT Prophylaxis  :  Lovenox   Lab Results  Component Value Date   PLT 429 (H) 08/30/2019    Diet :  Diet Order            Diet regular Room service appropriate? Yes; Fluid consistency: Thin  Diet effective now                  Inpatient Medications  Scheduled Meds:  . (feeding supplement) PROSource Plus  30 mL Oral TID BM  . clonazePAM  1 mg Oral Daily  . enoxaparin (LOVENOX) injection  40 mg Subcutaneous Daily  . feeding supplement (ENSURE ENLIVE)  237 mL Oral TID BM  . Ipratropium-Albuterol  1 puff Inhalation TID  . methylPREDNISolone (SOLU-MEDROL) injection  50 mg Intravenous Daily  . multivitamin with minerals  1 tablet Oral Daily  . pantoprazole  40 mg Oral Daily   Continuous Infusions:  PRN Meds:.acetaminophen, chlorpheniramine-HYDROcodone, ondansetron **OR** ondansetron (ZOFRAN) IV  Antibiotics  :    Anti-infectives (From admission, onward)   Start     Dose/Rate Route Frequency Ordered Stop   08/25/19 1000  remdesivir 100 mg in sodium chloride 0.9 % 100 mL IVPB        100 mg 200 mL/hr over 30 Minutes Intravenous Daily 08/24/19 0357 08/28/19 0956   08/24/19 0400  remdesivir 100 mg in sodium chloride 0.9 %  100 mL IVPB        100 mg 200 mL/hr over 30 Minutes Intravenous Every 30 min 08/24/19 0357 08/24/19 0623       Time Spent in minutes  30   Susa Raring M.D on 08/30/2019 at 11:26 AM  To page go to [email or web address] - password [email or web address]  [facility] -  Office  [phone]   See all Orders from today for further details    Objective:   Vitals:   08/29/19 1421 08/29/19 2027 08/30/19 0425 08/30/19 0833  BP: 111/83 117/90 101/61 107/79  Pulse: 93 92 91 93  Resp: 20 20 (!) 21 17  Temp: 98.4 F (36.9 C) 98 F (36.7 C) 97.7 F (36.5 C) 97.9 F (36.6 C)  TempSrc: Oral Oral Oral Oral  SpO2: 94% 92% 91% 93%  Weight:      Height:        Wt Readings from Last 3 Encounters:  08/25/19 92.1 kg     Intake/Output Summary (Last 24 hours) at 08/30/2019 1126 Last data filed at 08/30/2019 0432 Gross per 24 hour  Intake 560 ml  Output 1475 ml  Net -915 ml     Physical Exam  Awake Alert, No new F.N deficits, Normal affect [place].AT,PERRAL Supple Neck,No JVD, No cervical lymphadenopathy appriciated.  Symmetrical Chest wall movement, Good air movement bilaterally, CTAB RRR,No Gallops, Rubs or new Murmurs, No Parasternal Heave +ve B.Sounds, Abd Soft, No tenderness, No organomegaly appriciated, No rebound - guarding or rigidity. No Cyanosis, Clubbing or edema, No new Rash or bruise    Data Review:    CBC Recent Labs  Lab 08/26/19 0630 08/27/19 0339 08/28/19 0500 08/29/19 0256 08/30/19 0715  WBC 4.0 7.2 8.6 8.9 15.1*  HGB 14.9 15.4 16.4 17.4* 17.4*  HCT 44.7 46.1 48.6 50.0 50.0  PLT 244 306 337 383 429*  MCV 93.7 93.9 92.2 91.6 89.4  MCH 31.2 31.4 31.1 31.9 31.1  MCHC 33.3 33.4 33.7 34.8 34.8  RDW 12.1 11.9 12.0 11.9 11.7  LYMPHSABS 0.5* 0.7 1.4 1.2 1.0  MONOABS 0.3 0.3 0.4 0.4 0.4  EOSABS 0.0 0.0 0.0 0.0 0.1  BASOSABS 0.0 0.0 0.0 0.0 0.0    Chemistries  Recent Labs  Lab 08/26/19 0630 08/27/19 0339 08/27/19 1156 08/28/19 0500 08/29/19 0256 08/30/19 0715  NA  136 138  --  134* 133* 132*  K 4.1 4.3  --  3.4* 4.1 3.6  CL 98 98  --  95* 94* 94*  CO2 26 28  --  GLUCOSE 151* 152*  --  101* 116* 115*  BUN 22* 24*  --  28* 29* 29*  CREATININE 1.02 1.12  --  0.92 0.99 1.10  CALCIUM 8.8* 8.7*  --  8.9 8.8* 8.9  AST 55* 306*  --  121* 60* 43*  ALT 50* 432*  --  369* 291* 201*  ALKPHOS 65 82  --  82 81 73  BILITOT 0.3 0.9  --  0.8 0.9 1.0  MG 2.3 2.3  --  2.5* 2.6* 2.5*  INR  --   --  0.9 0.9 1.0 1.0     ------------------------------------------------------------------------------------------------------------------ No results for input(s): CHOL, HDL, LDLCALC, TRIG, CHOLHDL, LDLDIRECT in the last 72 hours.  No results found for: HGBA1C ------------------------------------------------------------------------------------------------------------------ No results for input(s): TSH, T4TOTAL, T3FREE, THYROIDAB in the last 72 hours.  Invalid input(s): FREET3  Cardiac Enzymes No results for input(s): CKMB, TROPONINI, MYOGLOBIN in the last 168 hours.  Invalid input(s): CK ------------------------------------------------------------------------------------------------------------------    Component Value Date/Time   BNP 43.9 08/30/2019 0715    Micro Results Recent Results (from the past 240 hour(s))  SARS Coronavirus 2 by RT PCR (hospital order, performed in [facility] hospital lab) Nasopharyngeal Nasopharyngeal Swab     Status: Abnormal   Collection Time: 08/24/19 12:10 AM   Specimen: Nasopharyngeal Swab  Result Value Ref Range Status   SARS Coronavirus 2 POSITIVE (A) NEGATIVE Final    Comment: RESULT CALLED TO, READ BACK BY AND VERIFIED WITH: ADKINS,L AT 0105 ON [id number] BY CHERESNOWSKY,T (NOTE) SARS-CoV-2 target nucleic acids are DETECTED  SARS-CoV-2 RNA is generally detectable in upper respiratory specimens  during the acute phase of infection.  Positive results are indicative  of the presence of the identified virus, but do not  rule out bacterial infection or co-infection with other pathogens not detected by the test.  Clinical correlation with patient history and  other diagnostic information is necessary to determine patient infection status.  The expected result is negative.  Fact Sheet for Patients:   BoilerBrush.com.cy   Fact Sheet for Healthcare Providers:   https://pope.com/    This test is not yet approved or cleared by the [place] FDA and  has been authorized for detection and/or diagnosis of SARS-CoV-2 by FDA under an Emergency Use Authorization (EUA).  This EUA will remain in effect (meani ng this test can be used) for the duration of  the COVID-19 declaration under Section 564(b)(1) of the Act, 21 U.S.C. section 360-bbb-3(b)(1), unless the authorization is terminated or revoked sooner.  Performed at [facility], [address] [place], [place] [address]     Radiology Reports CT ANGIO CHEST PE W OR WO CONTRAST  Result Date: 08/27/2019 CLINICAL DATA:  COVID-19 infection, acute respiratory disease, concern for pulmonary embolus, short of breath EXAM: CT ANGIOGRAPHY CHEST WITH CONTRAST TECHNIQUE: Multidetector CT imaging of the chest was performed using the standard protocol during bolus administration of intravenous contrast. Multiplanar CT image reconstructions and MIPs were obtained to evaluate the vascular  anatomy. CONTRAST:  42mL OMNIPAQUE IOHEXOL 350 MG/ML SOLN COMPARISON:  08/27/2019 FINDINGS: Cardiovascular: This is a technically adequate evaluation of the pulmonary vasculature. No filling defects or pulmonary emboli. The heart is unremarkable without pericardial effusion. Normal caliber of the thoracic aorta. Minimal atherosclerosis of the coronary vasculature. Incidental note is made partial anomalous pulmonary venous return with the left upper lobe pulmonary vein draining into the left brachiocephalic vein. Mediastinum/Nodes: No  enlarged mediastinal, hilar, or axillary lymph nodes. Thyroid gland, trachea, and esophagus demonstrate no significant findings. There is a small hiatal hernia. Lungs/Pleura: Multifocal bilateral ground-glass airspace disease is identified, greatest in the mid and lower lung zones, consistent with multifocal pneumonia and history of COVID-19. No effusion or pneumothorax. Central airways are patent. Upper Abdomen: No acute abnormality. Musculoskeletal: No acute or destructive bony lesions. Reconstructed images demonstrate no additional findings. Review of the MIP images confirms the above findings. IMPRESSION: 1. No evidence of pulmonary embolus. 2. Multifocal bilateral ground-glass airspace disease, greatest in the mid and lower lung zones, consistent with multifocal pneumonia and history of COVID-19. 3. 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 4. Small hiatal hernia. Electronically Signed   By: Sharlet Salina M.D.   On: 08/27/2019 15:52   DG Chest Port 1 View  Result Date: 08/30/2019 CLINICAL DATA:  COVID positive EXAM: PORTABLE CHEST 1 VIEW COMPARISON:  August 27, 2019 FINDINGS: Cardiomediastinal contours are partially obscured by increasing basilar opacities since the prior study. Lung volumes are diminished compared to the previous exam. On limited assessment no acute skeletal process. IMPRESSION: 1. Worsening basilar airspace disease and slight decrease in lung volumes in the setting of COVID-19 pneumonia. Electronically Signed   By: Donzetta Kohut M.D.   On: 08/30/2019 08:17   DG Chest Port 1 View  Result Date: 08/27/2019 CLINICAL DATA:  Shortness of breath EXAM: PORTABLE CHEST 1 VIEW COMPARISON:  08/24/2019 chest radiograph. FINDINGS: Peripheral predominant streaky pulmonary opacities are unchanged. No pneumothorax or pleural effusion. Cardiomediastinal silhouette is unchanged. No acute osseous abnormality.  IMPRESSION: Multifocal pneumonia, grossly unchanged. Electronically Signed   By: Stana Bunting M.D.   On: 08/27/2019 09:51   DG Chest Portable 1 View  Result Date: 08/24/2019 CLINICAL DATA:  Cough fever COVID positive EXAM: PORTABLE CHEST 1 VIEW COMPARISON:  None. FINDINGS: Streaky bilateral pulmonary opacities. Normal heart size. No pneumothorax or pleural effusion IMPRESSION: Streaky bilateral pulmonary opacities suspicious for bilateral pneumonia. Electronically Signed   By: Jasmine Pang M.D.   On: 08/24/2019 00:41   ECHOCARDIOGRAM COMPLETE  Result Date: 08/27/2019    ECHOCARDIOGRAM REPORT   Patient Name:   RAYGEN DAHM [facility] Date of Exam: 08/27/2019 Medical Rec #:  [id number]                Height:       72.0 in Accession #:    [id number]               Weight:       203.0 lb Date of Birth:  08-17-1965                BSA:          2.144 m Patient Age:    54 years                 BP:           121/80 mmHg Patient Gender: M  HR:           74 bpm. Exam Location:  Inpatient Procedure: 2D Echo and Intracardiac Opacification Agent Indications:    CHF-Acute Diastolic 428.31 / I50.31  History:        Patient has no prior history of Echocardiogram examinations.                 Signs/Symptoms:Shortness of Breath. Covid 19. GERD.  Sonographer:    Leta Jungling RDCS Referring Phys: Heide Scales [facility]  Sonographer Comments: Suboptimal apical window and suboptimal subcostal window. IMPRESSIONS  1. Left ventricular ejection fraction, by estimation, is 60 to 65%. The left ventricle has normal function. The left ventricle has no regional wall motion abnormalities. There is mild left ventricular hypertrophy. Left ventricular diastolic parameters were normal.  2. Right ventricular systolic function is normal. The right ventricular size is normal.  3. The mitral valve is normal in structure. No evidence of mitral valve regurgitation. No evidence of mitral stenosis.  4. The aortic valve is  normal in structure. Aortic valve regurgitation is not visualized. No aortic stenosis is present.  5. The inferior vena cava is normal in size with greater than 50% respiratory variability, suggesting right atrial pressure of 3 mmHg. FINDINGS  Left Ventricle: Left ventricular ejection fraction, by estimation, is 60 to 65%. The left ventricle has normal function. The left ventricle has no regional wall motion abnormalities. Definity contrast agent was given IV to delineate the left ventricular  endocardial borders. The left ventricular internal cavity size was normal in size. There is mild left ventricular hypertrophy. Left ventricular diastolic parameters were normal. Right Ventricle: The right ventricular size is normal. No increase in right ventricular wall thickness. Right ventricular systolic function is normal. Left Atrium: Left atrial size was normal in size. Right Atrium: Right atrial size was normal in size. Pericardium: There is no evidence of pericardial effusion. Mitral Valve: The mitral valve is normal in structure. Normal mobility of the mitral valve leaflets. No evidence of mitral valve regurgitation. No evidence of mitral valve stenosis. Tricuspid Valve: The tricuspid valve is normal in structure. Tricuspid valve regurgitation is not demonstrated. No evidence of tricuspid stenosis. Aortic Valve: The aortic valve is normal in structure. Aortic valve regurgitation is not visualized. No aortic stenosis is present. Pulmonic Valve: The pulmonic valve was normal in structure. Pulmonic valve regurgitation is not visualized. No evidence of pulmonic stenosis. Aorta: The aortic root is normal in size and structure. Venous: The inferior vena cava is normal in size with greater than 50% respiratory variability, suggesting right atrial pressure of 3 mmHg. IAS/Shunts: The interatrial septum was not well visualized.  LEFT VENTRICLE PLAX 2D LVIDd:         4.20 cm  Diastology LVIDs:         3.00 cm  LV e' lateral:    4.90 cm/s LV PW:         0.70 cm  LV E/e' lateral: 14.1 LV IVS:        2.45 cm  LV e' medial:    4.57 cm/s LVOT diam:     2.30 cm  LV E/e' medial:  15.1 LV SV:         74 LV SV Index:   34 LVOT Area:     4.15 cm  LEFT ATRIUM         Index LA diam:    2.10 cm 0.98 cm/m  AORTIC VALVE LVOT Vmax:   57.20 cm/s LVOT Vmean:  59.100 cm/s LVOT VTI:    0.178 m  AORTA Ao Root diam: 3.70 cm MITRAL VALVE MV Area (PHT): 2.71 cm    SHUNTS MV Decel Time: 280 msec    Systemic VTI:  0.18 m MV E velocity: 69.20 cm/s  Systemic Diam: 2.30 cm MV A velocity: 65.55 cm/s MV E/A ratio:  1.06 Charlton Haws MD Electronically signed by Charlton Haws MD Signature Date/Time: 08/27/2019/10:56:13 AM    Final    US Abdomen Limited RUQ  Result Date: 08/27/2019 CLINICAL DATA:  Elevated liver enzymes.  Inpatient. EXAM: ULTRASOUND ABDOMEN LIMITED RIGHT UPPER QUADRANT COMPARISON:  None. FINDINGS: Unable to turn patient into decubitus position due to restraints, limiting assessment. Gallbladder: No gallstones or wall thickening visualized. No sonographic Murphy sign noted by sonographer. Common bile duct: CBD not discretely visualized. No evidence of intrahepatic biliary ductal dilatation. Liver: Liver parenchyma is top-normal in echogenicity. No liver surface irregularity. No liver masses. Portal vein is patent on color Doppler imaging with normal direction of blood flow towards the liver. Other: None. IMPRESSION: Limited scan, see comments. No acute abnormality. No cholelithiasis. Electronically Signed   By: Delbert Phenix M.D.   On: 08/27/2019 18:31

## 2019-08-30 NOTE — Progress Notes (Signed)
Spoke with wife via telephone while at bedside with pt, wife voiced concerns that pt maintained O2 saturation at mid-high 80% while transferring to bed and while on phone. Pt currently 88% on 10L HFNC, educated wife and pt on importance of weaning oxygen requirements when able. Both verbalized understanding.  Pt now on 12L HFNC, pt stated that he would prone himself once he is ready to sleep. No other needs at this time, will CTM.

## 2019-08-30 NOTE — Progress Notes (Addendum)
Nutrition Follow-up  DOCUMENTATION CODES:   Not applicable  INTERVENTION:  Continue 30 ml Prosource plus po TID, each supplement provides 100 kcal and 15 grams of protein.   Provide Boost Breeze po TID, each supplement provides 250 kcal and 9 grams of protein.  Encourage adequate PO intake.   NUTRITION DIAGNOSIS:   Increased nutrient needs related to catabolic illness (acute respiratory disease due to COVID-19 virus infection) as evidenced by estimated needs; ongoing  GOAL:   Patient will meet greater than or equal to 90% of their needs; progressing  MONITOR:   PO intake, Supplement acceptance, Skin, Weight trends, Labs, I & O's  REASON FOR ASSESSMENT:   Malnutrition Screening Tool    ASSESSMENT:   54 year old male with no past medical history diagnosed with COVID-19 infection ~1 week ago presented with progressive SOB admitted with acute respiratory disease due to COVID-19 virus.  RD working remotely. Pt is currently on 12 L HFNC. Pt on a regular diet with meal completion varied from 25-100%. Pt currently has Ensure ordered and has been refusing them as it has been causing abdominal discomfort. RD to order Boost Breeze instead to aid in caloric and protein needs. Pt additionally has Prosource plus ordered and has been consuming them. RD to continue with nutritional supplementation. Labs and medications reviewed.   Diet Order:   Diet Order            Diet regular Room service appropriate? Yes; Fluid consistency: Thin  Diet effective now                 EDUCATION NEEDS:   No education needs have been identified at this time  Skin:  Skin Assessment: Reviewed RN Assessment  Last BM:  8/18  Height:   Ht Readings from Last 1 Encounters:  08/25/19 6' (1.829 m)    Weight:   Wt Readings from Last 1 Encounters:  08/25/19 92.1 kg    BMI:  Body mass index is 27.53 kg/m.  Estimated Nutritional Needs:   Kcal:  2200-2400  Protein:  115-125 grams  Fluid:   >/= 2 L/day  Roslyn Smiling, MS, RD, LDN RD pager number/after hours weekend pager number on Amion.

## 2019-08-31 ENCOUNTER — Inpatient Hospital Stay (HOSPITAL_COMMUNITY): Payer: 59

## 2019-08-31 LAB — BLOOD GAS, ARTERIAL
Acid-Base Excess: 4.1 mmol/L — ABNORMAL HIGH (ref 0.0–2.0)
Bicarbonate: 27.5 mmol/L (ref 20.0–28.0)
FIO2: 80
O2 Saturation: 91.4 %
Patient temperature: 36.5
pCO2 arterial: 36 mmHg (ref 32.0–48.0)
pH, Arterial: 7.493 — ABNORMAL HIGH (ref 7.350–7.450)
pO2, Arterial: 58.6 mmHg — ABNORMAL LOW (ref 83.0–108.0)

## 2019-08-31 LAB — CBC WITH DIFFERENTIAL/PLATELET
Abs Immature Granulocytes: 0.28 10*3/uL — ABNORMAL HIGH (ref 0.00–0.07)
Basophils Absolute: 0.1 10*3/uL (ref 0.0–0.1)
Basophils Relative: 0 %
Eosinophils Absolute: 0.4 10*3/uL (ref 0.0–0.5)
Eosinophils Relative: 2 %
HCT: 50.7 % (ref 39.0–52.0)
Hemoglobin: 17.7 g/dL — ABNORMAL HIGH (ref 13.0–17.0)
Immature Granulocytes: 2 %
Lymphocytes Relative: 4 %
Lymphs Abs: 0.8 10*3/uL (ref 0.7–4.0)
MCH: 31.2 pg (ref 26.0–34.0)
MCHC: 34.9 g/dL (ref 30.0–36.0)
MCV: 89.4 fL (ref 80.0–100.0)
Monocytes Absolute: 0.5 10*3/uL (ref 0.1–1.0)
Monocytes Relative: 3 %
Neutro Abs: 16.2 10*3/uL — ABNORMAL HIGH (ref 1.7–7.7)
Neutrophils Relative %: 89 %
Platelets: 469 10*3/uL — ABNORMAL HIGH (ref 150–400)
RBC: 5.67 MIL/uL (ref 4.22–5.81)
RDW: 11.7 % (ref 11.5–15.5)
WBC: 18.2 10*3/uL — ABNORMAL HIGH (ref 4.0–10.5)
nRBC: 0 % (ref 0.0–0.2)

## 2019-08-31 LAB — MAGNESIUM: Magnesium: 2.6 mg/dL — ABNORMAL HIGH (ref 1.7–2.4)

## 2019-08-31 LAB — COMPREHENSIVE METABOLIC PANEL
ALT: 144 U/L — ABNORMAL HIGH (ref 0–44)
AST: 35 U/L (ref 15–41)
Albumin: 3.4 g/dL — ABNORMAL LOW (ref 3.5–5.0)
Alkaline Phosphatase: 74 U/L (ref 38–126)
Anion gap: 13 (ref 5–15)
BUN: 27 mg/dL — ABNORMAL HIGH (ref 6–20)
CO2: 25 mmol/L (ref 22–32)
Calcium: 8.8 mg/dL — ABNORMAL LOW (ref 8.9–10.3)
Chloride: 92 mmol/L — ABNORMAL LOW (ref 98–111)
Creatinine, Ser: 1.05 mg/dL (ref 0.61–1.24)
GFR calc Af Amer: >60 mL/min (ref >60)
GFR calc non Af Amer: >60 mL/min (ref >60)
Glucose, Bld: 109 mg/dL — ABNORMAL HIGH (ref 70–99)
Potassium: 3.7 mmol/L (ref 3.5–5.1)
Sodium: 130 mmol/L — ABNORMAL LOW (ref 135–145)
Total Bilirubin: 0.9 mg/dL (ref 0.3–1.2)
Total Protein: 6.7 g/dL (ref 6.5–8.1)

## 2019-08-31 LAB — C-REACTIVE PROTEIN: CRP: 0.5 mg/dL (ref <1.0)

## 2019-08-31 LAB — PROTIME-INR
INR: 1 (ref 0.8–1.2)
Prothrombin Time: 13.1 seconds (ref 11.4–15.2)

## 2019-08-31 LAB — D-DIMER, QUANTITATIVE: D-Dimer, Quant: 0.5 ug/mL-FEU (ref 0.00–0.50)

## 2019-08-31 LAB — BRAIN NATRIURETIC PEPTIDE: B Natriuretic Peptide: 52.9 pg/mL (ref 0.0–100.0)

## 2019-08-31 LAB — PROCALCITONIN: Procalcitonin: <0.1 ng/mL

## 2019-08-31 MED ORDER — LEVOFLOXACIN 750 MG PO TABS
750.0000 mg | ORAL_TABLET | Freq: Every day | ORAL | Status: AC
  Administered 2019-08-31 – 2019-09-04 (×5): 750 mg via ORAL
  Filled 2019-08-31 (×5): qty 1

## 2019-08-31 MED ORDER — LACTATED RINGERS IV SOLN: INTRAVENOUS | Status: AC

## 2019-08-31 MED ORDER — FUROSEMIDE 10 MG/ML IJ SOLN: 40.0000 mg | Freq: Once | INTRAMUSCULAR | Status: DC

## 2019-08-31 MED ORDER — OXYMETAZOLINE HCL 0.05 % NA SOLN
1.0000 | Freq: Once | NASAL | Status: AC
  Administered 2019-08-31: 1 via NASAL
  Filled 2019-08-31: qty 30

## 2019-08-31 MED ORDER — SALINE SPRAY 0.65 % NA SOLN
1.0000 | NASAL | Status: DC | PRN
  Filled 2019-08-31: qty 44

## 2019-08-31 NOTE — Progress Notes (Signed)
CCMD called and made this RN aware that pts O2 was 79%, upon entering the room pt was sitting up in bed and complaining of being SOB, HFNC increased to 15L, pt currently maintaining 87-86%. Pt stated he is unable to prone but is laying on his side. Placed pt on nonrebreather and HFNC, pt now maintaining 92%. Pt stated that he is claustrophobic and is unsure if he can continue to wear the mask, but will try his best. Will CTM.

## 2019-08-31 NOTE — Progress Notes (Signed)
PROGRESS NOTE                                                                                                                                                                                                             Patient Demographics:    Samuel [name], is a 54 y.o. male, DOB - 10-06-1965, WFU:[id number]  Outpatient Primary MD for the patient is Patient, [name]    LOS - 6  Admit date - 08/24/2019    Chief Complaint  Patient presents with  . Cough       Brief Narrative Samuel [name] is a 54 y.o. male with no significant past medical history presents to the ER at bedside at [facility] with complaint of shortness of breath.  Patient started having upper respiratory tract-like symptoms and headache about a week ago and was diagnosed with COVID-19 infection.  He continued to get progressively short of breath and presented to the ER with severe hypoxia requiring 12 L of oxygen and was admitted to the hospital subsequently.   Subjective:   Patient in bed, appears comfortable, denies any headache, no fever, no chest pain or pressure, improved shortness of breath , no abdominal pain. No focal weakness.  Mains overall quite anxious.    Assessment  & Plan :     Acute Hypoxic Resp. Failure due to Acute Covid 19 Viral Pneumonitis during the ongoing 2020 Covid 19 Pandemic - he has severe disease and is unfortunately unvaccinated.  He was promptly placed on steroids, remdesivir and received Actemra on the day of admission.  He had shown good improvement however evening of 08/26/2019 after going to the bed he abruptly became short of breath and developed a panic attack, he has been placed on 10-12 L of oxygen.  On exam he has a few rails for which she will be diuresed and monitored closely.  This could be waxing and waning COVID -19 infection although his inflammatory markers have stabilized.  Echocardiogram was non acute &  - ve CTA of the chest.  Encouraged the patient to sit up in chair in the daytime use I-S and flutter valve for pulmonary toiletry and then prone in bed when at night.  Will advance activity and titrate down oxygen as possible.  SpO2: 98 % O2 Flow Rate (L/min): 15 L/min FiO2 (%): Marland Kitchen)  15 %    Recent Labs  Lab 08/25/19 0337 08/25/19 0337 08/26/19 0630 08/26/19 0630 08/27/19 0339 08/27/19 1156 08/28/19 0500 08/29/19 0256 08/30/19 0715 08/30/19 1127 08/31/19 0630 08/31/19 0646  WBC 7.1   < > 4.0   < > 7.2  --  8.6 8.9 15.1*  --  18.2*  --   CRP 10.7*   < > 6.6*   < > 3.4*  --  1.6* 0.9 0.5  --  0.5  --   DDIMER 0.90*   < > 0.57*   < > 0.76*  --  0.73* 0.71* 0.56*  --  0.50  --   BNP  --   --  32.2   < > 128.7*  --  39.9 34.7 43.9  --   --  52.9  PROCALCITON <0.10  --  <0.10  --  <0.10  --   --   --   --  <0.10 <0.10  --   AST 33   < > 55*   < > 306*  --  121* 60* 43*  --  35  --   ALT 22   < > 50*   < > 432*  --  369* 291* 201*  --  144*  --   ALKPHOS 59   < > 65   < > 82  --  82 81 73  --  74  --   BILITOT 0.5   < > 0.3   < > 0.9  --  0.8 0.9 1.0  --  0.9  --   ALBUMIN 3.0*   < > 3.0*   < > 3.0*  --  3.2* 3.3* 3.4*  --  3.4*  --   INR  --   --   --   --   --  0.9 0.9 1.0 1.0  --  1.0  --    < > = values in this interval not displayed.       Productive cough developed 08/31/2019.  Question mild superimposed bacterial bronchitis or pneumonia.  Procalcitonin stable, 5 days of oral Levaquin and monitor.  Obtain MRSA PCR.    Rising LFTs.  Due to combination of COVID-19 infection, remdesivir and Actemra use.  Asymptomatic with stable right upper quadrant ultrasound and improving LFT trend.  GERD - PPI  Severe anxiety.  On Klonopin.  Will add Lexapro as well.  Mild hyponatremia -urine sodium less than 10, gentle hydration and monitor.    Condition - Guarded ++  Family Communication  :  Wife [phone] On 08/27/2019 at 11:15 AM, 08/28/19, 08/29/19, 08/30/19, 08/31/19  Code  Status :  Full  Consults  :  None  Procedures  :    TTE  -  1. Left ventricular ejection fraction, by estimation, is 60 to 65%. The left ventricle has normal function. The left ventricle has no regional wall motion abnormalities. There is mild left ventricular hypertrophy. Left ventricular diastolic parameters were normal.  2. Right ventricular systolic function is normal. The right ventricular size is normal.  3. The mitral valve is normal in structure. No evidence of mitral valve regurgitation. No evidence of mitral stenosis.  4. The aortic valve is normal in structure. Aortic valve regurgitation is not visualized. No aortic stenosis is present.  5. The inferior vena cava is normal in size with greater than 50% respiratory variability, suggesting right atrial pressure of 3 mmHg.    CTA  - No PE  RUQ US - Non acute  PUD Prophylaxis : PPI  Disposition Plan  :    Status is: Inpatient  Remains inpatient appropriate because:IV treatments appropriate due to intensity of illness or inability to take PO   Dispo: The patient is from: Home              Anticipated d/c is to: Home              Anticipated d/c date is: > 3 days              Patient currently is not medically stable to d/c.   DVT Prophylaxis  :  Lovenox   Lab Results  Component Value Date   PLT 469 (H) 08/31/2019    Diet :  Diet Order            Diet regular Room service appropriate? Yes; Fluid consistency: Thin  Diet effective now                  Inpatient Medications  Scheduled Meds:  . (feeding supplement) PROSource Plus  30 mL Oral TID BM  . clonazePAM  1 mg Oral Daily  . enoxaparin (LOVENOX) injection  40 mg Subcutaneous Daily  . feeding supplement  1 Container Oral TID BM  . Ipratropium-Albuterol  1 puff Inhalation TID  . levofloxacin  750 mg Oral Daily  . methylPREDNISolone (SOLU-MEDROL) injection  50 mg Intravenous Daily  . multivitamin with minerals  1 tablet Oral Daily  . pantoprazole  40 mg  Oral Daily   Continuous Infusions:  PRN Meds:.acetaminophen, chlorpheniramine-HYDROcodone, [DISCONTINUED] ondansetron **OR** ondansetron (ZOFRAN) IV  Antibiotics  :    Anti-infectives (From admission, onward)   Start     Dose/Rate Route Frequency Ordered Stop   08/31/19 1030  levofloxacin (LEVAQUIN) tablet 750 mg        750 mg Oral Daily 08/31/19 1021 09/05/19 0959   08/25/19 1000  remdesivir 100 mg in sodium chloride 0.9 % 100 mL IVPB        100 mg 200 mL/hr over 30 Minutes Intravenous Daily 08/24/19 0357 08/28/19 0956   08/24/19 0400  remdesivir 100 mg in sodium chloride 0.9 % 100 mL IVPB        100 mg 200 mL/hr over 30 Minutes Intravenous Every 30 min 08/24/19 0357 08/24/19 0623       Time Spent in minutes  30   Susa Raring M.D on 08/31/2019 at 10:21 AM  To page go to [email or web address] - password [facility]  [facility] -  Office  [phone]   See all Orders from today for further details    Objective:   Vitals:   08/30/19 2020 08/31/19 0331 08/31/19 0435 08/31/19 0745  BP: 122/85  105/66 (!) 151/75  Pulse: 91 75 80 79  Resp: (!) 21 16 20 18   Temp: 98.6 F (37 C)  97.8 F (36.6 C) 98 F (36.7 C)  TempSrc: Oral  Oral Oral  SpO2: (!) 87% 92% 92% 98%  Weight:      Height:        Wt Readings from Last 3 Encounters:  08/25/19 92.1 kg     Intake/Output Summary (Last 24 hours) at 08/31/2019 1021 Last data filed at 08/31/2019 0300 Gross per 24 hour  Intake 480 ml  Output 200 ml  Net 280 ml     Physical Exam  Awake Alert, No new F.N deficits, Normal affect Samuel [name],Samuel [name] Supple Neck,No JVD, No cervical lymphadenopathy appriciated.  Symmetrical Chest wall movement, Good air movement  bilaterally, few rales RRR,No Gallops, Rubs or new Murmurs, No Parasternal Heave +ve B.Sounds, Abd Soft, No tenderness, No organomegaly appriciated, No rebound - guarding or rigidity. No Cyanosis, Clubbing or edema, No new Rash or bruise     Data Review:    CBC Recent  Labs  Lab 08/27/19 0339 08/28/19 0500 08/29/19 0256 08/30/19 0715 08/31/19 0630  WBC 7.2 8.6 8.9 15.1* 18.2*  HGB 15.4 16.4 17.4* 17.4* 17.7*  HCT 46.1 48.6 50.0 50.0 50.7  PLT 306 337 383 429* 469*  MCV 93.9 92.2 91.6 89.4 89.4  MCH 31.4 31.1 31.9 31.1 31.2  MCHC 33.4 33.7 34.8 34.8 34.9  RDW 11.9 12.0 11.9 11.7 11.7  LYMPHSABS 0.7 1.4 1.2 1.0 0.8  MONOABS 0.3 0.4 0.4 0.4 0.5  EOSABS 0.0 0.0 0.0 0.1 0.4  BASOSABS 0.0 0.0 0.0 0.0 0.1    Chemistries  Recent Labs  Lab 08/27/19 0339 08/27/19 1156 08/28/19 0500 08/29/19 0256 08/30/19 0715 08/31/19 0630  NA 138  --  134* 133* 132* 130*  K 4.3  --  3.4* 4.1 3.6 3.7  CL 98  --  95* 94* 94* 92*  CO2 28  --  26 26 25 25   GLUCOSE 152*  --  101* 116* 115* 109*  BUN 24*  --  28* 29* 29* 27*  CREATININE 1.12  --  0.92 0.99 1.10 1.05  CALCIUM 8.7*  --  8.9 8.8* 8.9 8.8*  AST 306*  --  121* 60* 43* 35  ALT 432*  --  369* 291* 201* 144*  ALKPHOS 82  --  82 81 73 74  BILITOT 0.9  --  0.8 0.9 1.0 0.9  MG 2.3  --  2.5* 2.6* 2.5* 2.6*  INR  --  0.9 0.9 1.0 1.0 1.0     ------------------------------------------------------------------------------------------------------------------ No results for input(s): CHOL, HDL, LDLCALC, TRIG, CHOLHDL, LDLDIRECT in the last 72 hours.  No results found for: HGBA1C ------------------------------------------------------------------------------------------------------------------ No results for input(s): TSH, T4TOTAL, T3FREE, THYROIDAB in the last 72 hours.  Invalid input(s): FREET3  Cardiac Enzymes No results for input(s): CKMB, TROPONINI, MYOGLOBIN in the last 168 hours.  Invalid input(s): CK ------------------------------------------------------------------------------------------------------------------    Component Value Date/Time   BNP 52.9 08/31/2019 0646    Micro Results Recent Results (from the past 240 hour(s))  SARS Coronavirus 2 by RT PCR (hospital order, performed in [facility] hospital lab) Nasopharyngeal Nasopharyngeal Swab     Status: Abnormal   Collection Time: 08/24/19 12:10 AM   Specimen: Nasopharyngeal Swab  Result Value Ref Range Status   SARS Coronavirus 2 POSITIVE (A) NEGATIVE Final    Comment: RESULT CALLED TO, READ BACK BY AND VERIFIED WITH: ADKINS,L AT 0105 ON 08/26/19 BY CHERESNOWSKY,T (NOTE) SARS-CoV-2 target nucleic acids are DETECTED  SARS-CoV-2 RNA is generally detectable in upper respiratory specimens  during the acute phase of infection.  Positive results are indicative  of the presence of the identified virus, but do not rule out bacterial infection or co-infection with other pathogens not detected by the test.  Clinical correlation with patient history and  other diagnostic information is necessary to determine patient infection status.  The expected result is negative.  Fact Sheet for Patients:   818299   Fact Sheet for Healthcare Providers:   BoilerBrush.com.cy    This test is not yet approved or cleared by the https://pope.com/ FDA and  has been authorized for detection and/or diagnosis of SARS-CoV-2 by FDA under an Emergency Use Authorization (EUA).  This  EUA will remain in effect (meani ng this test can be used) for the duration of  the COVID-19 declaration under Section 564(b)(1) of the Act, 21 U.S.C. section 360-bbb-3(b)(1), unless the authorization is terminated or revoked sooner.  Performed at [facility], [address] [place], [place] [address]     Radiology Reports CT ANGIO CHEST PE W OR WO CONTRAST  Result Date: 08/27/2019 CLINICAL DATA:  COVID-19 infection, acute respiratory disease, concern for pulmonary embolus, short of breath EXAM: CT ANGIOGRAPHY CHEST WITH CONTRAST TECHNIQUE: Multidetector CT imaging of the chest was performed using the standard protocol during bolus administration of intravenous contrast. Multiplanar CT image  reconstructions and MIPs were obtained to evaluate the vascular anatomy. CONTRAST:  80mL OMNIPAQUE IOHEXOL 350 MG/ML SOLN COMPARISON:  08/27/2019 FINDINGS: Cardiovascular: This is a technically adequate evaluation of the pulmonary vasculature. No filling defects or pulmonary emboli. The heart is unremarkable without pericardial effusion. Normal caliber of the thoracic aorta. Minimal atherosclerosis of the coronary vasculature. Incidental note is made partial anomalous pulmonary venous return with the left upper lobe pulmonary vein draining into the left brachiocephalic vein. Mediastinum/Nodes: No enlarged mediastinal, hilar, or axillary lymph nodes. Thyroid gland, trachea, and esophagus demonstrate no significant findings. There is a small hiatal hernia. Lungs/Pleura: Multifocal bilateral ground-glass airspace disease is identified, greatest in the mid and lower lung zones, consistent with multifocal pneumonia and history of COVID-19. No effusion or pneumothorax. Central airways are patent. Upper Abdomen: No acute abnormality. Musculoskeletal: No acute or destructive bony lesions. Reconstructed images demonstrate no additional findings. Review of the MIP images confirms the above findings. IMPRESSION: 1. No evidence of pulmonary embolus. 2. Multifocal bilateral ground-glass airspace disease, greatest in the mid and lower lung zones, consistent with multifocal pneumonia and history of COVID-19. 3. 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 4. Small hiatal hernia. Electronically Signed   By: Sharlet Salina M.D.   On: 08/27/2019 15:52   DG CHEST PORT 1 VIEW  Result Date: 08/31/2019 CLINICAL DATA:  Shortness of breath.  COVID. EXAM: PORTABLE CHEST 1 VIEW COMPARISON:  08/30/2019. FINDINGS: Mediastinum hilar structures normal. Heart size stable. Low lung volumes with persistent bibasilar pulmonary infiltrates. Similar findings  on prior exam. No pleural effusion or pneumothorax. IMPRESSION: Low lung volumes with persistent bibasilar infiltrates in this known COVID positive patient. Similar findings noted on prior exam. Electronically Signed   By: Maisie Fus  Register   On: 08/31/2019 07:16   DG Chest Port 1 View  Result Date: 08/30/2019 CLINICAL DATA:  COVID positive EXAM: PORTABLE CHEST 1 VIEW COMPARISON:  August 27, 2019 FINDINGS: Cardiomediastinal contours are partially obscured by increasing basilar opacities since the prior study. Lung volumes are diminished compared to the previous exam. On limited assessment no acute skeletal process. IMPRESSION: 1. Worsening basilar airspace disease and slight decrease in lung volumes in the setting of COVID-19 pneumonia. Electronically Signed   By: Donzetta Kohut M.D.   On: 08/30/2019 08:17   DG Chest Port 1 View  Result Date: 08/27/2019 CLINICAL DATA:  Shortness of breath EXAM: PORTABLE CHEST 1 VIEW COMPARISON:  08/24/2019 chest radiograph. FINDINGS: Peripheral predominant streaky pulmonary opacities are unchanged. No pneumothorax or pleural effusion. Cardiomediastinal silhouette is unchanged. No acute osseous abnormality. IMPRESSION: Multifocal pneumonia, grossly unchanged. Electronically Signed   By: Stana Bunting M.D.   On: 08/27/2019 09:51   DG Chest Portable 1 View  Result Date: 08/24/2019 CLINICAL DATA:  Cough fever COVID positive EXAM: PORTABLE CHEST 1 VIEW COMPARISON:  None. FINDINGS: Streaky bilateral pulmonary opacities. Normal heart size. No pneumothorax or pleural effusion IMPRESSION: Streaky bilateral pulmonary opacities suspicious for bilateral pneumonia. Electronically Signed   By: Jasmine Pang M.D.   On: 08/24/2019 00:41   ECHOCARDIOGRAM COMPLETE  Result Date: 08/27/2019    ECHOCARDIOGRAM REPORT   Patient Name:   VIVAAN HELSETH [facility] [address] Date of Exam: 08/27/2019 Medical Rec #:  [id number]                Height:       72.0 in Accession #:    [id number]                Weight:       203.0 lb Date of Birth:  Oct 29, 1965                BSA:          2.144 m Patient Age:    54 years                 BP:           121/80 mmHg Patient Gender: M                        HR:           74 bpm. Exam Location:  Inpatient Procedure: 2D Echo and Intracardiac Opacification Agent Indications:    CHF-Acute Diastolic 428.31 / I50.31  History:        Patient has no prior history of Echocardiogram examinations.                 Signs/Symptoms:Shortness of Breath. Covid 19. GERD.  Sonographer:    Leta Jungling RDCS Referring Phys: Heide Scales [facility]  Sonographer Comments: Suboptimal apical window and suboptimal subcostal window. IMPRESSIONS  1. Left ventricular ejection fraction, by estimation, is 60 to 65%. The left ventricle has normal function. The left ventricle has no regional wall motion abnormalities. There is mild left ventricular hypertrophy. Left ventricular diastolic parameters were normal.  2. Right ventricular systolic function is normal. The right ventricular size is normal.  3. The mitral valve is normal in structure. No evidence of mitral valve regurgitation. No evidence of mitral stenosis.  4. The aortic valve is normal in structure. Aortic valve regurgitation is not visualized. No aortic stenosis is present.  5. The inferior vena cava is normal in size with greater than 50% respiratory variability, suggesting right atrial pressure of 3 mmHg. FINDINGS  Left Ventricle: Left ventricular ejection fraction, by estimation, is 60 to 65%. The left ventricle has normal function. The left ventricle has no regional wall motion abnormalities. Definity contrast agent was given IV to delineate the left ventricular  endocardial borders. The left ventricular internal cavity size was normal in size. There is mild left ventricular hypertrophy. Left ventricular diastolic parameters were normal. Right Ventricle: The right ventricular size is normal. No increase in right ventricular wall thickness.  Right ventricular systolic function is normal. Left Atrium: Left atrial size was normal in size. Right Atrium: Right atrial size was normal in size. Pericardium: There is no evidence of pericardial effusion. Mitral Valve: The mitral valve is normal in structure. Normal mobility of the mitral valve leaflets. No evidence of mitral valve regurgitation. No evidence of mitral valve stenosis. Tricuspid Valve: The tricuspid valve is normal in structure. Tricuspid valve regurgitation is not demonstrated. No evidence of tricuspid  stenosis. Aortic Valve: The aortic valve is normal in structure. Aortic valve regurgitation is not visualized. No aortic stenosis is present. Pulmonic Valve: The pulmonic valve was normal in structure. Pulmonic valve regurgitation is not visualized. No evidence of pulmonic stenosis. Aorta: The aortic root is normal in size and structure. Venous: The inferior vena cava is normal in size with greater than 50% respiratory variability, suggesting right atrial pressure of 3 mmHg. IAS/Shunts: The interatrial septum was not well visualized.  LEFT VENTRICLE PLAX 2D LVIDd:         4.20 cm  Diastology LVIDs:         3.00 cm  LV e' lateral:   4.90 cm/s LV PW:         0.70 cm  LV E/e' lateral: 14.1 LV IVS:        2.45 cm  LV e' medial:    4.57 cm/s LVOT diam:     2.30 cm  LV E/e' medial:  15.1 LV SV:         74 LV SV Index:   34 LVOT Area:     4.15 cm  LEFT ATRIUM         Index LA diam:    2.10 cm 0.98 cm/m  AORTIC VALVE LVOT Vmax:   57.20 cm/s LVOT Vmean:  59.100 cm/s LVOT VTI:    0.178 m  AORTA Ao Root diam: 3.70 cm MITRAL VALVE MV Area (PHT): 2.71 cm    SHUNTS MV Decel Time: 280 msec    Systemic VTI:  0.18 m MV E velocity: 69.20 cm/s  Systemic Diam: 2.30 cm MV A velocity: 65.55 cm/s MV E/A ratio:  1.06 Samuel Haws MD Electronically signed by Samuel Haws MD Signature Date/Time: 08/27/2019/10:56:13 AM    Final    US Abdomen Limited RUQ  Result Date: 08/27/2019 CLINICAL DATA:  Elevated liver enzymes.   Inpatient. EXAM: ULTRASOUND ABDOMEN LIMITED RIGHT UPPER QUADRANT COMPARISON:  None. FINDINGS: Unable to turn patient into decubitus position due to restraints, limiting assessment. Gallbladder: No gallstones or wall thickening visualized. No sonographic Murphy sign noted by sonographer. Common bile duct: CBD not discretely visualized. No evidence of intrahepatic biliary ductal dilatation. Liver: Liver parenchyma is top-normal in echogenicity. No liver surface irregularity. No liver masses. Portal vein is patent on color Doppler imaging with normal direction of blood flow towards the liver. Other: None. IMPRESSION: Limited scan, see comments. No acute abnormality. No cholelithiasis. Electronically Signed   By: Delbert Phenix M.D.   On: 08/27/2019 18:31

## 2019-08-31 NOTE — Progress Notes (Signed)
Called by RN informed that patient had worsening hypoxia approximately 2 hours ago when she turned oxygen up to 15 L by nasal cannula.  Within the last hour has become more short of breath and hypoxic and nonrebreather was placed on the patient.  He is now satting in the low 90s. She reports the patient states he gets claustrophobic and does not want to wear the mask.  She tried to remove it but he would desat into the mid 80s fairly quickly. Ordered an ABG and stat chest x-ray.  If ABG shows severe derangement patient may need BiPAP versus intubation.

## 2019-09-01 LAB — C-REACTIVE PROTEIN: CRP: 0.6 mg/dL (ref <1.0)

## 2019-09-01 LAB — COMPREHENSIVE METABOLIC PANEL
ALT: 102 U/L — ABNORMAL HIGH (ref 0–44)
AST: 25 U/L (ref 15–41)
Albumin: 3.2 g/dL — ABNORMAL LOW (ref 3.5–5.0)
Alkaline Phosphatase: 69 U/L (ref 38–126)
Anion gap: 13 (ref 5–15)
BUN: 21 mg/dL — ABNORMAL HIGH (ref 6–20)
CO2: 28 mmol/L (ref 22–32)
Calcium: 8.8 mg/dL — ABNORMAL LOW (ref 8.9–10.3)
Chloride: 91 mmol/L — ABNORMAL LOW (ref 98–111)
Creatinine, Ser: 1.03 mg/dL (ref 0.61–1.24)
GFR calc Af Amer: >60 mL/min (ref >60)
GFR calc non Af Amer: >60 mL/min (ref >60)
Glucose, Bld: 102 mg/dL — ABNORMAL HIGH (ref 70–99)
Potassium: 3.8 mmol/L (ref 3.5–5.1)
Sodium: 132 mmol/L — ABNORMAL LOW (ref 135–145)
Total Bilirubin: 1.1 mg/dL (ref 0.3–1.2)
Total Protein: 6.1 g/dL — ABNORMAL LOW (ref 6.5–8.1)

## 2019-09-01 LAB — D-DIMER, QUANTITATIVE: D-Dimer, Quant: 0.5 ug/mL-FEU (ref 0.00–0.50)

## 2019-09-01 LAB — CBC WITH DIFFERENTIAL/PLATELET
Abs Immature Granulocytes: 0.24 10*3/uL — ABNORMAL HIGH (ref 0.00–0.07)
Basophils Absolute: 0 10*3/uL (ref 0.0–0.1)
Basophils Relative: 0 %
Eosinophils Absolute: 0.6 10*3/uL — ABNORMAL HIGH (ref 0.0–0.5)
Eosinophils Relative: 3 %
HCT: 47.8 % (ref 39.0–52.0)
Hemoglobin: 16.6 g/dL (ref 13.0–17.0)
Immature Granulocytes: 1 %
Lymphocytes Relative: 5 %
Lymphs Abs: 0.8 10*3/uL (ref 0.7–4.0)
MCH: 31.3 pg (ref 26.0–34.0)
MCHC: 34.7 g/dL (ref 30.0–36.0)
MCV: 90.2 fL (ref 80.0–100.0)
Monocytes Absolute: 0.5 10*3/uL (ref 0.1–1.0)
Monocytes Relative: 3 %
Neutro Abs: 14.6 10*3/uL — ABNORMAL HIGH (ref 1.7–7.7)
Neutrophils Relative %: 88 %
Platelets: 389 10*3/uL (ref 150–400)
RBC: 5.3 MIL/uL (ref 4.22–5.81)
RDW: 11.7 % (ref 11.5–15.5)
WBC: 16.7 10*3/uL — ABNORMAL HIGH (ref 4.0–10.5)
nRBC: 0 % (ref 0.0–0.2)

## 2019-09-01 LAB — BRAIN NATRIURETIC PEPTIDE: B Natriuretic Peptide: 36.5 pg/mL (ref 0.0–100.0)

## 2019-09-01 LAB — PROCALCITONIN: Procalcitonin: <0.1 ng/mL

## 2019-09-01 LAB — MAGNESIUM: Magnesium: 2.5 mg/dL — ABNORMAL HIGH (ref 1.7–2.4)

## 2019-09-01 MED ORDER — LACTATED RINGERS IV SOLN: INTRAVENOUS | Status: AC

## 2019-09-01 MED ORDER — ESCITALOPRAM OXALATE 10 MG PO TABS
5.0000 mg | ORAL_TABLET | Freq: Every day | ORAL | Status: DC
  Administered 2019-09-01 – 2019-09-09 (×9): 5 mg via ORAL
  Filled 2019-09-01 (×9): qty 1

## 2019-09-01 MED ORDER — CLONAZEPAM 1 MG PO TABS
1.0000 mg | ORAL_TABLET | Freq: Two times a day (BID) | ORAL | Status: DC
  Administered 2019-09-01 – 2019-09-09 (×17): 1 mg via ORAL
  Filled 2019-09-01 (×17): qty 1

## 2019-09-01 NOTE — Progress Notes (Signed)
PROGRESS NOTE                                                                                                                                                                                                             Patient Demographics:    Samuel [name], is a 54 y.o. male, DOB - 10/24/1965, OAC:[id number]  Outpatient Primary MD for the patient is Patient, [name]    LOS - 7  Admit date - 08/24/2019    Chief Complaint  Patient presents with  . Cough       Brief Narrative Samuel [name] is a 54 y.o. male with no significant past medical history presents to the ER at bedside at [facility] with complaint of shortness of breath.  Patient started having upper respiratory tract-like symptoms and headache about a week ago and was diagnosed with COVID-19 infection.  He continued to get progressively short of breath and presented to the ER with severe hypoxia requiring 12 L of oxygen and was admitted to the hospital subsequently.   Subjective:   Patient in bed, appears comfortable, denies any headache, no fever, no chest pain or pressure, improved shortness of breath , no abdominal pain. No focal weakness.   Assessment  & Plan :     Acute Hypoxic Resp. Failure due to Acute Covid 19 Viral Pneumonitis during the ongoing 2020 Covid 19 Pandemic - he has severe disease and is unfortunately unvaccinated.  He was promptly placed on steroids, remdesivir and received Actemra on the day of admission.  He had shown good improvement however evening of 08/26/2019 after going to the bed he abruptly became short of breath and developed a panic attack, he has been placed on 10-12 L of oxygen.  On exam he has a few rails for which she will be diuresed and monitored closely.  This could be waxing and waning COVID -19 infection although his inflammatory markers have stabilized.  Echocardiogram was non acute & - ve CTA of the  chest.  Encouraged the patient to sit up in chair in the daytime use I-S and flutter valve for pulmonary toiletry and then prone in bed when at night.  Will advance activity and titrate down oxygen as possible.  SpO2: 93 % O2 Flow Rate (L/min): 15 L/min FiO2 (%): (!) 15 %    Recent  Labs  Lab 08/26/19 0630 08/26/19 0630 08/27/19 0339 08/27/19 0339 08/27/19 1156 08/28/19 0500 08/29/19 0256 08/30/19 0715 08/30/19 1127 08/31/19 0630 08/31/19 0646 09/01/19 0554  WBC 4.0   < > 7.2   < >  --  8.6 8.9 15.1*  --  18.2*  --  16.7*  CRP 6.6*   < > 3.4*   < >  --  1.6* 0.9 0.5  --  0.5  --  0.6  DDIMER 0.57*   < > 0.76*   < >  --  0.73* 0.71* 0.56*  --  0.50  --  0.50  BNP 32.2   < > 128.7*   < >  --  39.9 34.7 43.9  --   --  52.9 36.5  PROCALCITON <0.10  --  <0.10  --   --   --   --   --  <0.10 <0.10  --  <0.10  AST 55*   < > 306*   < >  --  121* 60* 43*  --  35  --  25  ALT 50*   < > 432*   < >  --  369* 291* 201*  --  144*  --  102*  ALKPHOS 65   < > 82   < >  --  82 81 73  --  74  --  69  BILITOT 0.3   < > 0.9   < >  --  0.8 0.9 1.0  --  0.9  --  1.1  ALBUMIN 3.0*   < > 3.0*   < >  --  3.2* 3.3* 3.4*  --  3.4*  --  3.2*  INR  --   --   --   --  0.9 0.9 1.0 1.0  --  1.0  --   --    < > = values in this interval not displayed.       Productive cough developed 08/31/2019.  Question mild superimposed bacterial bronchitis or pneumonia.  Procalcitonin stable, 5 days of oral Levaquin and monitor.  Obtain MRSA PCR.    Rising LFTs.  Due to combination of COVID-19 infection, remdesivir and Actemra use.  Asymptomatic with stable right upper quadrant ultrasound and improving LFT trend.  GERD - PPI  Severe anxiety.  On Klonopin.  Will add Lexapro as well.  Mild hyponatremia - urine sodium less than 10, improving with gentle hydration and monitor.     Condition - Guarded ++  Family Communication  :  Wife Inetta Fermoina [phone] On 08/27/2019 at 11:15 AM, 08/28/19, 08/29/19, 08/30/19, 08/31/19,  09/01/19  Code Status :  Full  Consults  :  None  Procedures  :    TTE  -  1. Left ventricular ejection fraction, by estimation, is 60 to 65%. The left ventricle has normal function. The left ventricle has no regional wall motion abnormalities. There is mild left ventricular hypertrophy. Left ventricular diastolic parameters were normal.  2. Right ventricular systolic function is normal. The right ventricular size is normal.  3. The mitral valve is normal in structure. No evidence of mitral valve regurgitation. No evidence of mitral stenosis.  4. The aortic valve is normal in structure. Aortic valve regurgitation is not visualized. No aortic stenosis is present.  5. The inferior vena cava is normal in size with greater than 50% respiratory variability, suggesting right atrial pressure of 3 mmHg.    CTA  - No PE  RUQ US - Non acute  PUD Prophylaxis : PPI  Disposition Plan  :    Status is: Inpatient  Remains inpatient appropriate because:IV treatments appropriate due to intensity of illness or inability to take PO   Dispo: The patient is from: Home              Anticipated d/c is to: Home              Anticipated d/c date is: > 3 days              Patient currently is not medically stable to d/c.   DVT Prophylaxis  :  Lovenox   Lab Results  Component Value Date   PLT 389 09/01/2019    Diet :  Diet Order            Diet regular Room service appropriate? Yes; Fluid consistency: Thin  Diet effective now                  Inpatient Medications  Scheduled Meds:  . (feeding supplement) PROSource Plus  30 mL Oral TID BM  . clonazePAM  1 mg Oral Daily  . enoxaparin (LOVENOX) injection  40 mg Subcutaneous Daily  . feeding supplement  1 Container Oral TID BM  . Ipratropium-Albuterol  1 puff Inhalation TID  . levofloxacin  750 mg Oral Daily  . methylPREDNISolone (SOLU-MEDROL) injection  50 mg Intravenous Daily  . multivitamin with minerals  1 tablet Oral Daily  .  pantoprazole  40 mg Oral Daily   Continuous Infusions: . lactated ringers     PRN Meds:.acetaminophen, chlorpheniramine-HYDROcodone, [DISCONTINUED] ondansetron **OR** ondansetron (ZOFRAN) IV, sodium chloride  Antibiotics  :    Anti-infectives (From admission, onward)   Start     Dose/Rate Route Frequency Ordered Stop   08/31/19 1100  levofloxacin (LEVAQUIN) tablet 750 mg        750 mg Oral Daily 08/31/19 1021 09/05/19 0959   08/25/19 1000  remdesivir 100 mg in sodium chloride 0.9 % 100 mL IVPB        100 mg 200 mL/hr over 30 Minutes Intravenous Daily 08/24/19 0357 08/28/19 0956   08/24/19 0400  remdesivir 100 mg in sodium chloride 0.9 % 100 mL IVPB        100 mg 200 mL/hr over 30 Minutes Intravenous Every 30 min 08/24/19 0357 08/24/19 0623       Time Spent in minutes  30   Susa Raring M.D on 09/01/2019 at 9:14 AM  To page go to [email or web address] - password [facility]  [facility] -  Office  [phone]   See all Orders from today for further details    Objective:   Vitals:   08/31/19 1557 08/31/19 2000 09/01/19 0000 09/01/19 0855  BP: 109/82 103/75 109/77   Pulse: 96 100 88   Resp: 20 20 17    Temp: 98.3 F (36.8 C) 98.6 F (37 C) 98.1 F (36.7 C) 97.9 F (36.6 C)  TempSrc: Oral Oral Oral Oral  SpO2: 97% 90% 93%   Weight:      Height:        Wt Readings from Last 3 Encounters:  08/25/19 92.1 kg     Intake/Output Summary (Last 24 hours) at 09/01/2019 0914 Last data filed at 09/01/2019 0439 Gross per 24 hour  Intake --  Output 1250 ml  Net -1250 ml     Physical Exam  Awake Alert, No new F.N deficits, Normal affect [place].AT,PERRAL Supple Neck,No JVD, No cervical lymphadenopathy appriciated.  Symmetrical  Chest wall movement, Good air movement bilaterally, fine basilar Rales RRR,No Gallops, Rubs or new Murmurs, No Parasternal Heave +ve B.Sounds, Abd Soft, No tenderness, No organomegaly appriciated, No rebound - guarding or rigidity. No Cyanosis, Clubbing  or edema, No new Rash or bruise    Data Review:    CBC Recent Labs  Lab 08/28/19 0500 08/29/19 0256 08/30/19 0715 08/31/19 0630 09/01/19 0554  WBC 8.6 8.9 15.1* 18.2* 16.7*  HGB 16.4 17.4* 17.4* 17.7* 16.6  HCT 48.6 50.0 50.0 50.7 47.8  PLT 337 383 429* 469* 389  MCV 92.2 91.6 89.4 89.4 90.2  MCH 31.1 31.9 31.1 31.2 31.3  MCHC 33.7 34.8 34.8 34.9 34.7  RDW 12.0 11.9 11.7 11.7 11.7  LYMPHSABS 1.4 1.2 1.0 0.8 0.8  MONOABS 0.4 0.4 0.4 0.5 0.5  EOSABS 0.0 0.0 0.1 0.4 0.6*  BASOSABS 0.0 0.0 0.0 0.1 0.0    Chemistries  Recent Labs  Lab 08/27/19 0339 08/27/19 1156 08/28/19 0500 08/29/19 0256 08/30/19 0715 08/31/19 0630 09/01/19 0554  NA   < >  --  134* 133* 132* 130* 132*  K   < >  --  3.4* 4.1 3.6 3.7 3.8  CL   < >  --  95* 94* 94* 92* 91*  CO2   < >  --  26 26 25 25 28   GLUCOSE   < >  --  101* 116* 115* 109* 102*  BUN   < >  --  28* 29* 29* 27* 21*  CREATININE   < >  --  0.92 0.99 1.10 1.05 1.03  CALCIUM   < >  --  8.9 8.8* 8.9 8.8* 8.8*  AST   < >  --  121* 60* 43* 35 25  ALT   < >  --  369* 291* 201* 144* 102*  ALKPHOS   < >  --  82 81 73 74 69  BILITOT   < >  --  0.8 0.9 1.0 0.9 1.1  MG   < >  --  2.5* 2.6* 2.5* 2.6* 2.5*  INR  --  0.9 0.9 1.0 1.0 1.0  --    < > = values in this interval not displayed.     ------------------------------------------------------------------------------------------------------------------ No results for input(s): CHOL, HDL, LDLCALC, TRIG, CHOLHDL, LDLDIRECT in the last 72 hours.  No results found for: HGBA1C ------------------------------------------------------------------------------------------------------------------ No results for input(s): TSH, T4TOTAL, T3FREE, THYROIDAB in the last 72 hours.  Invalid input(s): FREET3  Cardiac Enzymes No results for input(s): CKMB, TROPONINI, MYOGLOBIN in the last 168 hours.  Invalid input(s):  CK ------------------------------------------------------------------------------------------------------------------    Component Value Date/Time   BNP 36.5 09/01/2019 0554    Micro Results Recent Results (from the past 240 hour(s))  SARS Coronavirus 2 by RT PCR (hospital order, performed in [facility] hospital lab) Nasopharyngeal Nasopharyngeal Swab     Status: Abnormal   Collection Time: 08/24/19 12:10 AM   Specimen: Nasopharyngeal Swab  Result Value Ref Range Status   SARS Coronavirus 2 POSITIVE (A) NEGATIVE Final    Comment: RESULT CALLED TO, READ BACK BY AND VERIFIED WITH: ADKINS,L AT 0105 ON [id number] BY CHERESNOWSKY,T (NOTE) SARS-CoV-2 target nucleic acids are DETECTED  SARS-CoV-2 RNA is generally detectable in upper respiratory specimens  during the acute phase of infection.  Positive results are indicative  of the presence of the identified virus, but do not rule out bacterial infection or co-infection with other pathogens not detected by the test.  Clinical correlation with patient  history and  other diagnostic information is necessary to determine patient infection status.  The expected result is negative.  Fact Sheet for Patients:   BoilerBrush.com.cy   Fact Sheet for Healthcare Providers:   https://pope.com/    This test is not yet approved or cleared by the [place] FDA and  has been authorized for detection and/or diagnosis of SARS-CoV-2 by FDA under an Emergency Use Authorization (EUA).  This EUA will remain in effect (meani ng this test can be used) for the duration of  the COVID-19 declaration under Section 564(b)(1) of the Act, 21 U.S.C. section 360-bbb-3(b)(1), unless the authorization is terminated or revoked sooner.  Performed at [facility], [address] [place], [place] [address]     Radiology Reports CT ANGIO CHEST PE W OR WO CONTRAST  Result Date: 08/27/2019 CLINICAL DATA:   COVID-19 infection, acute respiratory disease, concern for pulmonary embolus, short of breath EXAM: CT ANGIOGRAPHY CHEST WITH CONTRAST TECHNIQUE: Multidetector CT imaging of the chest was performed using the standard protocol during bolus administration of intravenous contrast. Multiplanar CT image reconstructions and MIPs were obtained to evaluate the vascular anatomy. CONTRAST:  80mL OMNIPAQUE IOHEXOL 350 MG/ML SOLN COMPARISON:  08/27/2019 FINDINGS: Cardiovascular: This is a technically adequate evaluation of the pulmonary vasculature. No filling defects or pulmonary emboli. The heart is unremarkable without pericardial effusion. Normal caliber of the thoracic aorta. Minimal atherosclerosis of the coronary vasculature. Incidental note is made partial anomalous pulmonary venous return with the left upper lobe pulmonary vein draining into the left brachiocephalic vein. Mediastinum/Nodes: No enlarged mediastinal, hilar, or axillary lymph nodes. Thyroid gland, trachea, and esophagus demonstrate no significant findings. There is a small hiatal hernia. Lungs/Pleura: Multifocal bilateral ground-glass airspace disease is identified, greatest in the mid and lower lung zones, consistent with multifocal pneumonia and history of COVID-19. No effusion or pneumothorax. Central airways are patent. Upper Abdomen: No acute abnormality. Musculoskeletal: No acute or destructive bony lesions. Reconstructed images demonstrate no additional findings. Review of the MIP images confirms the above findings. IMPRESSION: 1. No evidence of pulmonary embolus. 2. Multifocal bilateral ground-glass airspace disease, greatest in the mid and lower lung zones, consistent with multifocal pneumonia and history of COVID-19. 3. 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 4. Small hiatal hernia. Electronically Signed   By: Sharlet Salina M.D.   On: 08/27/2019 15:52    DG CHEST PORT 1 VIEW  Result Date: 08/31/2019 CLINICAL DATA:  Shortness of breath.  COVID. EXAM: PORTABLE CHEST 1 VIEW COMPARISON:  08/30/2019. FINDINGS: Mediastinum hilar structures normal. Heart size stable. Low lung volumes with persistent bibasilar pulmonary infiltrates. Similar findings on prior exam. No pleural effusion or pneumothorax. IMPRESSION: Low lung volumes with persistent bibasilar infiltrates in this known COVID positive patient. Similar findings noted on prior exam. Electronically Signed   By: Maisie Fus  Register   On: 08/31/2019 07:16   DG Chest Port 1 View  Result Date: 08/30/2019 CLINICAL DATA:  COVID positive EXAM: PORTABLE CHEST 1 VIEW COMPARISON:  August 27, 2019 FINDINGS: Cardiomediastinal contours are partially obscured by increasing basilar opacities since the prior study. Lung volumes are diminished compared to the previous exam. On limited assessment no acute skeletal process. IMPRESSION: 1. Worsening basilar airspace disease and slight decrease in lung volumes in the setting of COVID-19 pneumonia. Electronically Signed   By: Donzetta Kohut M.D.   On: 08/30/2019 08:17   DG Chest [facility]  Result Date: 08/27/2019 CLINICAL DATA:  Shortness of breath EXAM: PORTABLE CHEST 1 VIEW COMPARISON:  08/24/2019 chest radiograph. FINDINGS: Peripheral predominant streaky pulmonary opacities are unchanged. No pneumothorax or pleural effusion. Cardiomediastinal silhouette is unchanged. No acute osseous abnormality. IMPRESSION: Multifocal pneumonia, grossly unchanged. Electronically Signed   By: Stana Bunting M.D.   On: 08/27/2019 09:51   DG Chest Portable 1 View  Result Date: 08/24/2019 CLINICAL DATA:  Cough fever COVID positive EXAM: PORTABLE CHEST 1 VIEW COMPARISON:  None. FINDINGS: Streaky bilateral pulmonary opacities. Normal heart size. No pneumothorax or pleural effusion IMPRESSION: Streaky bilateral pulmonary opacities suspicious for bilateral pneumonia. Electronically  Signed   By: Jasmine Pang M.D.   On: 08/24/2019 00:41   ECHOCARDIOGRAM COMPLETE  Result Date: 08/27/2019    ECHOCARDIOGRAM REPORT   Patient Name:   OMARIUS GRANTHAM [facility] Date of Exam: 08/27/2019 Medical Rec #:  [id number]                Height:       72.0 in Accession #:    [id number]               Weight:       203.0 lb Date of Birth:  January 08, 1966                BSA:          2.144 m Patient Age:    54 years                 BP:           121/80 mmHg Patient Gender: M                        HR:           74 bpm. Exam Location:  Inpatient Procedure: 2D Echo and Intracardiac Opacification Agent Indications:    CHF-Acute Diastolic 428.31 / I50.31  History:        Patient has no prior history of Echocardiogram examinations.                 Signs/Symptoms:Shortness of Breath. Covid 19. GERD.  Sonographer:    Leta Jungling RDCS Referring Phys: Heide Scales [facility]  Sonographer Comments: Suboptimal apical window and suboptimal subcostal window. IMPRESSIONS  1. Left ventricular ejection fraction, by estimation, is 60 to 65%. The left ventricle has normal function. The left ventricle has no regional wall motion abnormalities. There is mild left ventricular hypertrophy. Left ventricular diastolic parameters were normal.  2. Right ventricular systolic function is normal. The right ventricular size is normal.  3. The mitral valve is normal in structure. No evidence of mitral valve regurgitation. No evidence of mitral stenosis.  4. The aortic valve is normal in structure. Aortic valve regurgitation is not visualized. No aortic stenosis is present.  5. The inferior vena cava is normal in size with greater than 50% respiratory variability, suggesting right atrial pressure of 3 mmHg. FINDINGS  Left Ventricle: Left ventricular ejection fraction, by estimation, is 60 to 65%. The left ventricle has normal function. The left ventricle has no regional wall motion abnormalities. Definity contrast agent was given IV to delineate  the left ventricular  endocardial borders. The left ventricular internal cavity size was normal in size. There is mild left ventricular hypertrophy. Left ventricular diastolic parameters were normal. Right Ventricle: The right ventricular size is normal. No increase in right ventricular wall thickness. Right ventricular systolic function is normal.  Left Atrium: Left atrial size was normal in size. Right Atrium: Right atrial size was normal in size. Pericardium: There is no evidence of pericardial effusion. Mitral Valve: The mitral valve is normal in structure. Normal mobility of the mitral valve leaflets. No evidence of mitral valve regurgitation. No evidence of mitral valve stenosis. Tricuspid Valve: The tricuspid valve is normal in structure. Tricuspid valve regurgitation is not demonstrated. No evidence of tricuspid stenosis. Aortic Valve: The aortic valve is normal in structure. Aortic valve regurgitation is not visualized. No aortic stenosis is present. Pulmonic Valve: The pulmonic valve was normal in structure. Pulmonic valve regurgitation is not visualized. No evidence of pulmonic stenosis. Aorta: The aortic root is normal in size and structure. Venous: The inferior vena cava is normal in size with greater than 50% respiratory variability, suggesting right atrial pressure of 3 mmHg. IAS/Shunts: The interatrial septum was not well visualized.  LEFT VENTRICLE PLAX 2D LVIDd:         4.20 cm  Diastology LVIDs:         3.00 cm  LV e' lateral:   4.90 cm/s LV PW:         0.70 cm  LV E/e' lateral: 14.1 LV IVS:        2.45 cm  LV e' medial:    4.57 cm/s LVOT diam:     2.30 cm  LV E/e' medial:  15.1 LV SV:         74 LV SV Index:   34 LVOT Area:     4.15 cm  LEFT ATRIUM         Index LA diam:    2.10 cm 0.98 cm/m  AORTIC VALVE LVOT Vmax:   57.20 cm/s LVOT Vmean:  59.100 cm/s LVOT VTI:    0.178 m  AORTA Ao Root diam: 3.70 cm MITRAL VALVE MV Area (PHT): 2.71 cm    SHUNTS MV Decel Time: 280 msec    Systemic VTI:  0.18  m MV E velocity: 69.20 cm/s  Systemic Diam: 2.30 cm MV A velocity: 65.55 cm/s MV E/A ratio:  1.06 Charlton Haws MD Electronically signed by Charlton Haws MD Signature Date/Time: 08/27/2019/10:56:13 AM    Final    US Abdomen Limited RUQ  Result Date: 08/27/2019 CLINICAL DATA:  Elevated liver enzymes.  Inpatient. EXAM: ULTRASOUND ABDOMEN LIMITED RIGHT UPPER QUADRANT COMPARISON:  None. FINDINGS: Unable to turn patient into decubitus position due to restraints, limiting assessment. Gallbladder: No gallstones or wall thickening visualized. No sonographic Murphy sign noted by sonographer. Common bile duct: CBD not discretely visualized. No evidence of intrahepatic biliary ductal dilatation. Liver: Liver parenchyma is top-normal in echogenicity. No liver surface irregularity. No liver masses. Portal vein is patent on color Doppler imaging with normal direction of blood flow towards the liver. Other: None. IMPRESSION: Limited scan, see comments. No acute abnormality. No cholelithiasis. Electronically Signed   By: Delbert Phenix M.D.   On: 08/27/2019 18:31

## 2019-09-01 NOTE — Plan of Care (Signed)
  Problem: Education: Goal: Knowledge of General Education information will improve Description Including pain rating scale, medication(s)/side effects and non-pharmacologic comfort measures Outcome: Progressing   

## 2019-09-02 ENCOUNTER — Inpatient Hospital Stay (HOSPITAL_COMMUNITY): Payer: 59

## 2019-09-02 LAB — CBC WITH DIFFERENTIAL/PLATELET
Abs Immature Granulocytes: 0.15 10*3/uL — ABNORMAL HIGH (ref 0.00–0.07)
Basophils Absolute: 0 10*3/uL (ref 0.0–0.1)
Basophils Relative: 0 %
Eosinophils Absolute: 0.6 10*3/uL — ABNORMAL HIGH (ref 0.0–0.5)
Eosinophils Relative: 5 %
HCT: 44.8 % (ref 39.0–52.0)
Hemoglobin: 15.7 g/dL (ref 13.0–17.0)
Immature Granulocytes: 1 %
Lymphocytes Relative: 4 %
Lymphs Abs: 0.6 10*3/uL — ABNORMAL LOW (ref 0.7–4.0)
MCH: 31.6 pg (ref 26.0–34.0)
MCHC: 35 g/dL (ref 30.0–36.0)
MCV: 90.1 fL (ref 80.0–100.0)
Monocytes Absolute: 0.4 10*3/uL (ref 0.1–1.0)
Monocytes Relative: 3 %
Neutro Abs: 11.8 10*3/uL — ABNORMAL HIGH (ref 1.7–7.7)
Neutrophils Relative %: 87 %
Platelets: 324 10*3/uL (ref 150–400)
RBC: 4.97 MIL/uL (ref 4.22–5.81)
RDW: 11.8 % (ref 11.5–15.5)
WBC: 13.5 10*3/uL — ABNORMAL HIGH (ref 4.0–10.5)
nRBC: 0 % (ref 0.0–0.2)

## 2019-09-02 LAB — MAGNESIUM: Magnesium: 2.3 mg/dL (ref 1.7–2.4)

## 2019-09-02 LAB — COMPREHENSIVE METABOLIC PANEL
ALT: 69 U/L — ABNORMAL HIGH (ref 0–44)
AST: 22 U/L (ref 15–41)
Albumin: 2.9 g/dL — ABNORMAL LOW (ref 3.5–5.0)
Alkaline Phosphatase: 65 U/L (ref 38–126)
Anion gap: 10 (ref 5–15)
BUN: 20 mg/dL (ref 6–20)
CO2: 25 mmol/L (ref 22–32)
Calcium: 8.4 mg/dL — ABNORMAL LOW (ref 8.9–10.3)
Chloride: 93 mmol/L — ABNORMAL LOW (ref 98–111)
Creatinine, Ser: 1.02 mg/dL (ref 0.61–1.24)
GFR calc Af Amer: >60 mL/min (ref >60)
GFR calc non Af Amer: >60 mL/min (ref >60)
Glucose, Bld: 102 mg/dL — ABNORMAL HIGH (ref 70–99)
Potassium: 3.3 mmol/L — ABNORMAL LOW (ref 3.5–5.1)
Sodium: 128 mmol/L — ABNORMAL LOW (ref 135–145)
Total Bilirubin: 1.1 mg/dL (ref 0.3–1.2)
Total Protein: 5.6 g/dL — ABNORMAL LOW (ref 6.5–8.1)

## 2019-09-02 LAB — D-DIMER, QUANTITATIVE: D-Dimer, Quant: 0.64 ug/mL-FEU — ABNORMAL HIGH (ref 0.00–0.50)

## 2019-09-02 LAB — C-REACTIVE PROTEIN: CRP: 1.2 mg/dL — ABNORMAL HIGH (ref <1.0)

## 2019-09-02 LAB — BRAIN NATRIURETIC PEPTIDE: B Natriuretic Peptide: 25.6 pg/mL (ref 0.0–100.0)

## 2019-09-02 MED ORDER — POTASSIUM CHLORIDE CRYS ER 20 MEQ PO TBCR
40.0000 meq | EXTENDED_RELEASE_TABLET | Freq: Once | ORAL | Status: AC
  Administered 2019-09-02: 40 meq via ORAL
  Filled 2019-09-02: qty 2

## 2019-09-02 MED ORDER — FUROSEMIDE 10 MG/ML IJ SOLN
40.0000 mg | Freq: Once | INTRAMUSCULAR | Status: AC
  Administered 2019-09-02: 40 mg via INTRAVENOUS
  Filled 2019-09-02: qty 4

## 2019-09-02 NOTE — Progress Notes (Signed)
PROGRESS NOTE                                                                                                                                                                                                             Patient Demographics:    Samuel [name], is a 54 y.o. male, DOB - [birth year], [name]  Outpatient Primary MD for the patient is Patient, [name]    LOS - 8  Admit date - 08/24/2019    Chief Complaint  Patient presents with  . Cough       Brief Narrative Samuel [name] is a 54 y.o. male with no significant past medical history presents to the ER at bedside at [facility] with complaint of shortness of breath.  Patient started having upper respiratory tract-like symptoms and headache about a week ago and was diagnosed with COVID-19 infection.  He continued to get progressively short of breath and presented to the ER with severe hypoxia requiring 12 L of oxygen and was admitted to the hospital subsequently.   Subjective:   Patient in bed, appears comfortable, denies any headache, no fever, no chest pain or pressure, improved shortness of breath , no abdominal pain. No focal weakness.   Assessment  & Plan :     Acute Hypoxic Resp. Failure due to Acute Covid 19 Viral Pneumonitis during the ongoing 2020 Covid 19 Pandemic - he has severe disease and is unfortunately unvaccinated.  He was promptly placed on steroids, remdesivir and received Actemra on the day of admission.  He had shown good improvement however evening of 08/26/2019 after going to the bed he abruptly became short of breath and developed a panic attack, he has been placed on 15 L of oxygen.    On exam he has a few rails for which she will be diuresed and monitored closely.  This could be waxing and waning COVID -19 infection although his inflammatory markers have stabilized.  Echocardiogram was non acute & - ve CTA of the  chest.  Encouraged the patient to sit up in chair in the daytime use I-S and flutter valve for pulmonary toiletry and then prone in bed when at night.  Will advance activity and titrate down oxygen as possible.  SpO2: 93 % O2 Flow Rate (L/min): 15 L/min FiO2 (%): (!) 15 %  Recent Labs  Lab 08/27/19 0339 08/27/19 0339 08/27/19 1156 08/28/19 0500 08/28/19 0500 08/29/19 0256 08/30/19 0715 08/30/19 1127 08/31/19 0630 08/31/19 0646 09/01/19 0554 09/02/19 0245  WBC 7.2   < >  --  8.6   < > 8.9 15.1*  --  18.2*  --  16.7* 13.5*  CRP 3.4*   < >  --  1.6*   < > 0.9 0.5  --  0.5  --  0.6 1.2*  DDIMER 0.76*   < >  --  0.73*   < > 0.71* 0.56*  --  0.50  --  0.50 0.64*  BNP 128.7*   < >  --  39.9   < > 34.7 43.9  --   --  52.9 36.5 25.6  PROCALCITON <0.10  --   --   --   --   --   --  <0.10 <0.10  --  <0.10  --   AST 306*   < >  --  121*   < > 60* 43*  --  35  --  25 22  ALT 432*   < >  --  369*   < > 291* 201*  --  144*  --  102* 69*  ALKPHOS 82   < >  --  82   < > 81 73  --  74  --  69 65  BILITOT 0.9   < >  --  0.8   < > 0.9 1.0  --  0.9  --  1.1 1.1  ALBUMIN 3.0*   < >  --  3.2*   < > 3.3* 3.4*  --  3.4*  --  3.2* 2.9*  INR  --   --  0.9 0.9  --  1.0 1.0  --  1.0  --   --   --    < > = values in this interval not displayed.       Productive cough developed 08/31/2019.  Question mild superimposed bacterial bronchitis or pneumonia.  Procalcitonin stable, 5 days of oral Levaquin and monitor.  Obtain MRSA PCR.    Rising LFTs.  Due to combination of COVID-19 infection, remdesivir and Actemra use.  Asymptomatic with stable right upper quadrant ultrasound and improving LFT trend.  GERD - PPI  Severe anxiety.  BID Klonopin. Will add Lexapro as well.  Mild hyponatremia - urine sodium less than 10, Ur Osm >>> than Serum Osm - worse with IVF, likely SIADH - Lasix trial and monitor.    Condition - Guarded ++  Family Communication  :  Wife Samuel [name] [phone] On 08/27/2019 at 11:15 AM,  08/28/19, 08/29/19, 08/30/19, 08/31/19, 09/01/19, 09/02/19  Code Status :  Full  Consults  :  None  Procedures  :    TTE  -  1. Left ventricular ejection fraction, by estimation, is 60 to 65%. The left ventricle has normal function. The left ventricle has no regional wall motion abnormalities. There is mild left ventricular hypertrophy. Left ventricular diastolic parameters were normal.  2. Right ventricular systolic function is normal. The right ventricular size is normal.  3. The mitral valve is normal in structure. No evidence of mitral valve regurgitation. No evidence of mitral stenosis.  4. The aortic valve is normal in structure. Aortic valve regurgitation is not visualized. No aortic stenosis is present.  5. The inferior vena cava is normal in size with greater than 50% respiratory variability, suggesting right atrial pressure of 3 mmHg.  CTA  -  No PE  RUQ [place] - Non acute   PUD Prophylaxis : PPI  Disposition Plan  :    Status is: Inpatient  Remains inpatient appropriate because:IV treatments appropriate due to intensity of illness or inability to take PO   Dispo: The patient is from: Home              Anticipated d/c is to: Home              Anticipated d/c date is: > 3 days              Patient currently is not medically stable to d/c.   DVT Prophylaxis  :  Lovenox   Lab Results  Component Value Date   PLT 324 09/02/2019    Diet :  Diet Order            Diet regular Room service appropriate? Yes; Fluid consistency: Thin  Diet effective now                  Inpatient Medications  Scheduled Meds:  . (feeding supplement) PROSource Plus  30 mL Oral TID BM  . clonazePAM  1 mg Oral BID  . enoxaparin (LOVENOX) injection  40 mg Subcutaneous Daily  . escitalopram  5 mg Oral Daily  . feeding supplement  1 Container Oral TID BM  . Ipratropium-Albuterol  1 puff Inhalation TID  . levofloxacin  750 mg Oral Daily  . methylPREDNISolone (SOLU-MEDROL) injection  50 mg  Intravenous Daily  . multivitamin with minerals  1 tablet Oral Daily  . pantoprazole  40 mg Oral Daily   Continuous Infusions:  PRN Meds:.acetaminophen, chlorpheniramine-HYDROcodone, [DISCONTINUED] ondansetron **OR** ondansetron (ZOFRAN) IV, sodium chloride  Antibiotics  :    Anti-infectives (From admission, onward)   Start     Dose/Rate Route Frequency Ordered Stop   08/31/19 1100  levofloxacin (LEVAQUIN) tablet 750 mg        750 mg Oral Daily 08/31/19 1021 09/05/19 0959   08/25/19 1000  remdesivir 100 mg in sodium chloride 0.9 % 100 mL IVPB        100 mg 200 mL/hr over 30 Minutes Intravenous Daily 08/24/19 0357 08/28/19 0956   08/24/19 0400  remdesivir 100 mg in sodium chloride 0.9 % 100 mL IVPB        100 mg 200 mL/hr over 30 Minutes Intravenous Every 30 min 08/24/19 0357 08/24/19 0623       Time Spent in minutes  30   Susa Raring M.D on 09/02/2019 at 10:17 AM  To page go to [email or web address] - password [email or web address]  [facility] -  Office  [phone]   See all Orders from today for further details    Objective:   Vitals:   09/01/19 2030 09/02/19 0207 09/02/19 0507 09/02/19 0752  BP: 113/69 110/69 104/67 104/67  Pulse: 98 (!) 103 95 81  Resp: 18 20 18 19   Temp:   98.1 F (36.7 C) 98.1 F (36.7 C)  TempSrc:   Oral   SpO2: (!) 88% 91% (!) 89% 93%  Weight:      Height:        Wt Readings from Last 3 Encounters:  08/25/19 92.1 kg     Intake/Output Summary (Last 24 hours) at 09/02/2019 1017 Last data filed at 09/02/2019 0845 Gross per 24 hour  Intake 240 ml  Output 775 ml  Net -535 ml     Physical Exam  Awake Alert, No new  F.N deficits, Normal affect [place].AT,PERRAL Supple Neck,No JVD, No cervical lymphadenopathy appriciated.  Symmetrical Chest wall movement, Good air movement bilaterally, few rales RRR,No Gallops, Rubs or new Murmurs, No Parasternal Heave +ve B.Sounds, Abd Soft, No tenderness, No organomegaly appriciated, No rebound - guarding or  rigidity. No Cyanosis, Clubbing or edema, No new Rash or bruise     Data Review:    CBC Recent Labs  Lab 08/29/19 0256 08/30/19 0715 08/31/19 0630 09/01/19 0554 09/02/19 0245  WBC 8.9 15.1* 18.2* 16.7* 13.5*  HGB 17.4* 17.4* 17.7* 16.6 15.7  HCT 50.0 50.0 50.7 47.8 44.8  PLT 383 429* 469* 389 324  MCV 91.6 89.4 89.4 90.2 90.1  MCH 31.9 31.1 31.2 31.3 31.6  MCHC 34.8 34.8 34.9 34.7 35.0  RDW 11.9 11.7 11.7 11.7 11.8  LYMPHSABS 1.2 1.0 0.8 0.8 0.6*  MONOABS 0.4 0.4 0.5 0.5 0.4  EOSABS 0.0 0.1 0.4 0.6* 0.6*  BASOSABS 0.0 0.0 0.1 0.0 0.0    Chemistries  Recent Labs  Lab 08/27/19 0339 08/27/19 1156 08/28/19 0500 08/28/19 0500 08/29/19 0256 08/30/19 0715 08/31/19 0630 09/01/19 0554 09/02/19 0245  NA   < >  --  134*   < > 133* 132* 130* 132* 128*  K   < >  --  3.4*   < > 4.1 3.6 3.7 3.8 3.3*  CL   < >  --  95*   < > 94* 94* 92* 91* 93*  CO2   < >  --  26   < > 26 25 25 28 25   GLUCOSE   < >  --  101*   < > 116* 115* 109* 102* 102*  BUN   < >  --  28*   < > 29* 29* 27* 21* 20  CREATININE   < >  --  0.92   < > 0.99 1.10 1.05 1.03 1.02  CALCIUM   < >  --  8.9   < > 8.8* 8.9 8.8* 8.8* 8.4*  AST   < >  --  121*   < > 60* 43* 35 25 22  ALT   < >  --  369*   < > 291* 201* 144* 102* 69*  ALKPHOS   < >  --  82   < > 81 73 74 69 65  BILITOT   < >  --  0.8   < > 0.9 1.0 0.9 1.1 1.1  MG   < >  --  2.5*   < > 2.6* 2.5* 2.6* 2.5* 2.3  INR  --  0.9 0.9  --  1.0 1.0 1.0  --   --    < > = values in this interval not displayed.     ------------------------------------------------------------------------------------------------------------------ No results for input(s): CHOL, HDL, LDLCALC, TRIG, CHOLHDL, LDLDIRECT in the last 72 hours.  No results found for: HGBA1C ------------------------------------------------------------------------------------------------------------------ No results for input(s): TSH, T4TOTAL, T3FREE, THYROIDAB in the last 72 hours.  Invalid input(s):  FREET3  Cardiac Enzymes No results for input(s): CKMB, TROPONINI, MYOGLOBIN in the last 168 hours.  Invalid input(s): CK ------------------------------------------------------------------------------------------------------------------    Component Value Date/Time   BNP 25.6 09/02/2019 0245    Micro Results Recent Results (from the past 240 hour(s))  SARS Coronavirus 2 by RT PCR (hospital order, performed in [facility] hospital lab) Nasopharyngeal Nasopharyngeal Swab     Status: Abnormal   Collection Time: 08/24/19 12:10 AM   Specimen: Nasopharyngeal Swab  Result Value Ref Range  Status   SARS Coronavirus 2 POSITIVE (A) NEGATIVE Final    Comment: RESULT CALLED TO, READ BACK BY AND VERIFIED WITH: ADKINS,L AT 0105 ON [id number] BY CHERESNOWSKY,T (NOTE) SARS-CoV-2 target nucleic acids are DETECTED  SARS-CoV-2 RNA is generally detectable in upper respiratory specimens  during the acute phase of infection.  Positive results are indicative  of the presence of the identified virus, but do not rule out bacterial infection or co-infection with other pathogens not detected by the test.  Clinical correlation with patient history and  other diagnostic information is necessary to determine patient infection status.  The expected result is negative.  Fact Sheet for Patients:   BoilerBrush.com.cyhttps://www.fda.gov/media/136312/download   Fact Sheet for Healthcare Providers:   https://pope.com/https://www.fda.gov/media/136313/download    This test is not yet approved or cleared by the [place] FDA and  has been authorized for detection and/or diagnosis of SARS-CoV-2 by FDA under an Emergency Use Authorization (EUA).  This EUA will remain in effect (meani ng this test can be used) for the duration of  the COVID-19 declaration under Section 564(b)(1) of the Act, 21 U.S.C. section 360-bbb-3(b)(1), unless the authorization is terminated or revoked sooner.  Performed at [facility] [place], [address]  [place], [place] [address]     Radiology Reports CT ANGIO CHEST PE W OR WO CONTRAST  Result Date: 08/27/2019 CLINICAL DATA:  COVID-19 infection, acute respiratory disease, concern for pulmonary embolus, short of breath EXAM: CT ANGIOGRAPHY CHEST WITH CONTRAST TECHNIQUE: Multidetector CT imaging of the chest was performed using the standard protocol during bolus administration of intravenous contrast. Multiplanar CT image reconstructions and MIPs were obtained to evaluate the vascular anatomy. CONTRAST:  80mL OMNIPAQUE IOHEXOL 350 MG/ML SOLN COMPARISON:  08/27/2019 FINDINGS: Cardiovascular: This is a technically adequate evaluation of the pulmonary vasculature. No filling defects or pulmonary emboli. The heart is unremarkable without pericardial effusion. Normal caliber of the thoracic aorta. Minimal atherosclerosis of the coronary vasculature. Incidental note is made partial anomalous pulmonary venous return with the left upper lobe pulmonary vein draining into the left brachiocephalic vein. Mediastinum/Nodes: No enlarged mediastinal, hilar, or axillary lymph nodes. Thyroid gland, trachea, and esophagus demonstrate no significant findings. There is a small hiatal hernia. Lungs/Pleura: Multifocal bilateral ground-glass airspace disease is identified, greatest in the mid and lower lung zones, consistent with multifocal pneumonia and history of COVID-19. No effusion or pneumothorax. Central airways are patent. Upper Abdomen: No acute abnormality. Musculoskeletal: No acute or destructive bony lesions. Reconstructed images demonstrate no additional findings. Review of the MIP images confirms the above findings. IMPRESSION: 1. No evidence of pulmonary embolus. 2. Multifocal bilateral ground-glass airspace disease, greatest in the mid and lower lung zones, consistent with multifocal pneumonia and history of COVID-19. 3. 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 4. Small hiatal hernia. Electronically Signed   By: Sharlet SalinaMichael  Brown M.D.   On: 08/27/2019 15:52   DG CHEST PORT 1 VIEW  Result Date: 08/31/2019 CLINICAL DATA:  Shortness of breath.  COVID. EXAM: PORTABLE CHEST 1 VIEW COMPARISON:  08/30/2019. FINDINGS: Mediastinum hilar structures normal. Heart size stable. Low lung volumes with persistent bibasilar pulmonary infiltrates. Similar findings on prior exam. No pleural effusion or pneumothorax. IMPRESSION: Low lung volumes with persistent bibasilar infiltrates in this known COVID positive patient. Similar findings noted on prior exam. Electronically Signed   By: Maisie Fushomas  Register   On: 08/31/2019 07:16   DG Chest Port 1  View  Result Date: 08/30/2019 CLINICAL DATA:  COVID positive EXAM: PORTABLE CHEST 1 VIEW COMPARISON:  August 27, 2019 FINDINGS: Cardiomediastinal contours are partially obscured by increasing basilar opacities since the prior study. Lung volumes are diminished compared to the previous exam. On limited assessment no acute skeletal process. IMPRESSION: 1. Worsening basilar airspace disease and slight decrease in lung volumes in the setting of COVID-19 pneumonia. Electronically Signed   By: Donzetta Kohut M.D.   On: 08/30/2019 08:17   DG Chest Port 1 View  Result Date: 08/27/2019 CLINICAL DATA:  Shortness of breath EXAM: PORTABLE CHEST 1 VIEW COMPARISON:  08/24/2019 chest radiograph. FINDINGS: Peripheral predominant streaky pulmonary opacities are unchanged. No pneumothorax or pleural effusion. Cardiomediastinal silhouette is unchanged. No acute osseous abnormality. IMPRESSION: Multifocal pneumonia, grossly unchanged. Electronically Signed   By: Stana Bunting M.D.   On: 08/27/2019 09:51   DG Chest Portable 1 View  Result Date: 08/24/2019 CLINICAL DATA:  Cough fever COVID positive EXAM: PORTABLE CHEST 1 VIEW COMPARISON:  None. FINDINGS: Streaky bilateral pulmonary opacities. Normal heart size. No  pneumothorax or pleural effusion IMPRESSION: Streaky bilateral pulmonary opacities suspicious for bilateral pneumonia. Electronically Signed   By: Jasmine Pang M.D.   On: 08/24/2019 00:41   ECHOCARDIOGRAM COMPLETE  Result Date: 08/27/2019    ECHOCARDIOGRAM REPORT   Patient Name:   BRAM HOTTEL [facility] Date of Exam: 08/27/2019 Medical Rec #:  [id number]                Height:       72.0 in Accession #:    [id number]               Weight:       203.0 lb Date of Birth:  1965-05-12                BSA:          2.144 m Patient Age:    54 years                 BP:           121/80 mmHg Patient Gender: M                        HR:           74 bpm. Exam Location:  Inpatient Procedure: 2D Echo and Intracardiac Opacification Agent Indications:    CHF-Acute Diastolic 428.31 / I50.31  History:        Patient has no prior history of Echocardiogram examinations.                 Signs/Symptoms:Shortness of Breath. Covid 19. GERD.  Sonographer:    Leta Jungling RDCS Referring Phys: Heide Scales [facility]  Sonographer Comments: Suboptimal apical window and suboptimal subcostal window. IMPRESSIONS  1. Left ventricular ejection fraction, by estimation, is 60 to 65%. The left ventricle has normal function. The left ventricle has no regional wall motion abnormalities. There is mild left ventricular hypertrophy. Left ventricular diastolic parameters were normal.  2. Right ventricular systolic function is normal. The right ventricular size is normal.  3. The mitral valve is normal in structure. No evidence of mitral valve regurgitation. No evidence of mitral stenosis.  4. The aortic valve is normal in structure. Aortic valve regurgitation is not visualized. No aortic stenosis is present.  5. The inferior vena cava is normal in size with greater than 50% respiratory variability, suggesting right atrial pressure  of 3 mmHg. FINDINGS  Left Ventricle: Left ventricular ejection fraction, by estimation, is 60 to 65%. The left ventricle  has normal function. The left ventricle has no regional wall motion abnormalities. Definity contrast agent was given IV to delineate the left ventricular  endocardial borders. The left ventricular internal cavity size was normal in size. There is mild left ventricular hypertrophy. Left ventricular diastolic parameters were normal. Right Ventricle: The right ventricular size is normal. No increase in right ventricular wall thickness. Right ventricular systolic function is normal. Left Atrium: Left atrial size was normal in size. Right Atrium: Right atrial size was normal in size. Pericardium: There is no evidence of pericardial effusion. Mitral Valve: The mitral valve is normal in structure. Normal mobility of the mitral valve leaflets. No evidence of mitral valve regurgitation. No evidence of mitral valve stenosis. Tricuspid Valve: The tricuspid valve is normal in structure. Tricuspid valve regurgitation is not demonstrated. No evidence of tricuspid stenosis. Aortic Valve: The aortic valve is normal in structure. Aortic valve regurgitation is not visualized. No aortic stenosis is present. Pulmonic Valve: The pulmonic valve was normal in structure. Pulmonic valve regurgitation is not visualized. No evidence of pulmonic stenosis. Aorta: The aortic root is normal in size and structure. Venous: The inferior vena cava is normal in size with greater than 50% respiratory variability, suggesting right atrial pressure of 3 mmHg. IAS/Shunts: The interatrial septum was not well visualized.  LEFT VENTRICLE PLAX 2D LVIDd:         4.20 cm  Diastology LVIDs:         3.00 cm  LV e' lateral:   4.90 cm/s LV PW:         0.70 cm  LV E/e' lateral: 14.1 LV IVS:        2.45 cm  LV e' medial:    4.57 cm/s LVOT diam:     2.30 cm  LV E/e' medial:  15.1 LV SV:         74 LV SV Index:   34 LVOT Area:     4.15 cm  LEFT ATRIUM         Index LA diam:    2.10 cm 0.98 cm/m  AORTIC VALVE LVOT Vmax:   57.20 cm/s LVOT Vmean:  59.100 cm/s LVOT VTI:     0.178 m  AORTA Ao Root diam: 3.70 cm MITRAL VALVE MV Area (PHT): 2.71 cm    SHUNTS MV Decel Time: 280 msec    Systemic VTI:  0.18 m MV E velocity: 69.20 cm/s  Systemic Diam: 2.30 cm MV A velocity: 65.55 cm/s MV E/A ratio:  1.06 Charlton Haws MD Electronically signed by Charlton Haws MD Signature Date/Time: 08/27/2019/10:56:13 AM    Final    US Abdomen Limited RUQ  Result Date: 08/27/2019 CLINICAL DATA:  Elevated liver enzymes.  Inpatient. EXAM: ULTRASOUND ABDOMEN LIMITED RIGHT UPPER QUADRANT COMPARISON:  None. FINDINGS: Unable to turn patient into decubitus position due to restraints, limiting assessment. Gallbladder: No gallstones or wall thickening visualized. No sonographic Murphy sign noted by sonographer. Common bile duct: CBD not discretely visualized. No evidence of intrahepatic biliary ductal dilatation. Liver: Liver parenchyma is top-normal in echogenicity. No liver surface irregularity. No liver masses. Portal vein is patent on color Doppler imaging with normal direction of blood flow towards the liver. Other: None. IMPRESSION: Limited scan, see comments. No acute abnormality. No cholelithiasis. Electronically Signed   By: Delbert Phenix M.D.   On: 08/27/2019 18:31

## 2019-09-02 NOTE — Progress Notes (Signed)
Patien's 02 sat was 80% with HFNC 15l. After discussed with raspatory placed on nonbreather 15 L.Encouraged patient for cough and breathing exercise. Patient is sitting on recliner. On call MD notified. Will continue monitor.

## 2019-09-03 ENCOUNTER — Inpatient Hospital Stay (HOSPITAL_COMMUNITY): Payer: 59

## 2019-09-03 LAB — CBC WITH DIFFERENTIAL/PLATELET
Abs Immature Granulocytes: 0.22 10*3/uL — ABNORMAL HIGH (ref 0.00–0.07)
Basophils Absolute: 0 10*3/uL (ref 0.0–0.1)
Basophils Relative: 0 %
Eosinophils Absolute: 0.2 10*3/uL (ref 0.0–0.5)
Eosinophils Relative: 1 %
HCT: 44.6 % (ref 39.0–52.0)
Hemoglobin: 15.6 g/dL (ref 13.0–17.0)
Immature Granulocytes: 2 %
Lymphocytes Relative: 4 %
Lymphs Abs: 0.6 10*3/uL — ABNORMAL LOW (ref 0.7–4.0)
MCH: 31.4 pg (ref 26.0–34.0)
MCHC: 35 g/dL (ref 30.0–36.0)
MCV: 89.7 fL (ref 80.0–100.0)
Monocytes Absolute: 0.4 10*3/uL (ref 0.1–1.0)
Monocytes Relative: 3 %
Neutro Abs: 12.2 10*3/uL — ABNORMAL HIGH (ref 1.7–7.7)
Neutrophils Relative %: 90 %
Platelets: 381 10*3/uL (ref 150–400)
RBC: 4.97 MIL/uL (ref 4.22–5.81)
RDW: 11.7 % (ref 11.5–15.5)
WBC: 13.6 10*3/uL — ABNORMAL HIGH (ref 4.0–10.5)
nRBC: 0 % (ref 0.0–0.2)

## 2019-09-03 LAB — BLOOD GAS, ARTERIAL
Acid-Base Excess: 3.7 mmol/L — ABNORMAL HIGH (ref 0.0–2.0)
Bicarbonate: 27.8 mmol/L (ref 20.0–28.0)
FIO2: 100
O2 Saturation: 91.1 %
Patient temperature: 37
pCO2 arterial: 42.7 mmHg (ref 32.0–48.0)
pH, Arterial: 7.429 (ref 7.350–7.450)
pO2, Arterial: 62.1 mmHg — ABNORMAL LOW (ref 83.0–108.0)

## 2019-09-03 LAB — COMPREHENSIVE METABOLIC PANEL
ALT: 59 U/L — ABNORMAL HIGH (ref 0–44)
AST: 22 U/L (ref 15–41)
Albumin: 3 g/dL — ABNORMAL LOW (ref 3.5–5.0)
Alkaline Phosphatase: 67 U/L (ref 38–126)
Anion gap: 12 (ref 5–15)
BUN: 22 mg/dL — ABNORMAL HIGH (ref 6–20)
CO2: 26 mmol/L (ref 22–32)
Calcium: 8.8 mg/dL — ABNORMAL LOW (ref 8.9–10.3)
Chloride: 95 mmol/L — ABNORMAL LOW (ref 98–111)
Creatinine, Ser: 1.08 mg/dL (ref 0.61–1.24)
GFR calc Af Amer: >60 mL/min (ref >60)
GFR calc non Af Amer: >60 mL/min (ref >60)
Glucose, Bld: 120 mg/dL — ABNORMAL HIGH (ref 70–99)
Potassium: 3.8 mmol/L (ref 3.5–5.1)
Sodium: 133 mmol/L — ABNORMAL LOW (ref 135–145)
Total Bilirubin: 0.6 mg/dL (ref 0.3–1.2)
Total Protein: 5.9 g/dL — ABNORMAL LOW (ref 6.5–8.1)

## 2019-09-03 LAB — EXPECTORATED SPUTUM ASSESSMENT W GRAM STAIN, RFLX TO RESP C

## 2019-09-03 LAB — D-DIMER, QUANTITATIVE: D-Dimer, Quant: 0.82 ug/mL-FEU — ABNORMAL HIGH (ref 0.00–0.50)

## 2019-09-03 LAB — MRSA PCR SCREENING: MRSA by PCR: NEGATIVE

## 2019-09-03 LAB — C-REACTIVE PROTEIN: CRP: 4 mg/dL — ABNORMAL HIGH (ref <1.0)

## 2019-09-03 LAB — BRAIN NATRIURETIC PEPTIDE: B Natriuretic Peptide: 32.8 pg/mL (ref 0.0–100.0)

## 2019-09-03 LAB — MAGNESIUM: Magnesium: 2.4 mg/dL (ref 1.7–2.4)

## 2019-09-03 MED ORDER — DM-GUAIFENESIN ER 30-600 MG PO TB12: 1.0000 | ORAL_TABLET | Freq: Two times a day (BID) | ORAL | Status: DC | PRN

## 2019-09-03 MED ORDER — METHYLPREDNISOLONE SODIUM SUCC 125 MG IJ SOLR
50.0000 mg | Freq: Two times a day (BID) | INTRAMUSCULAR | Status: DC
  Administered 2019-09-03 – 2019-09-05 (×6): 50 mg via INTRAVENOUS
  Filled 2019-09-03 (×6): qty 2

## 2019-09-03 MED ORDER — FUROSEMIDE 10 MG/ML IJ SOLN
40.0000 mg | Freq: Once | INTRAMUSCULAR | Status: AC
  Administered 2019-09-03: 40 mg via INTRAVENOUS
  Filled 2019-09-03: qty 4

## 2019-09-03 NOTE — Progress Notes (Signed)
Patient sitting up in chair.  He is currently on 15 liters nasal canula high flow, with his oxygen saturation at 100%.

## 2019-09-03 NOTE — Progress Notes (Signed)
PROGRESS NOTE                                                                                                                                                                                                             Samuel [name] Demographics:    Samuel [name], is a 54 y.o. male, DOB - 01/03/1966, [name]  Outpatient Primary MD for the Samuel [name] is Samuel [name], [name]    LOS - 9  Admit date - 08/24/2019    Chief Complaint  Samuel [name] presents with  . Cough       Brief Narrative Samuel [name] is a 54 y.o. male with no significant past medical history presents to the ER at bedside at [facility] with complaint of shortness of breath.  Samuel [name] started having upper respiratory tract-like symptoms and headache about a week ago and was diagnosed with COVID-19 infection.  He continued to get progressively short of breath and presented to the ER with severe hypoxia requiring 12 L of oxygen and was admitted to the hospital subsequently.   Subjective:   Samuel [name] in bed, appears comfortable, denies any headache, no fever, no chest pain or pressure, no shortness of breath , no abdominal pain. No focal weakness.   Assessment  & Plan :     Acute Hypoxic Resp. Failure due to Acute Covid 19 Viral Pneumonitis during the ongoing 2020 Covid 19 Pandemic - he has severe disease and is unfortunately unvaccinated.  He was promptly placed on steroids, remdesivir and received Actemra on the day of admission.  He had shown good improvement however evening of 08/26/2019 after going to the bed he abruptly became short of breath and developed a panic attack, he has been placed on 15 L of oxygen.    On exam he has a few rails for which she will be diuresed and monitored closely.  This could be waxing and waning COVID -19 infection although his inflammatory markers have stabilized.  Echocardiogram was non acute & - ve CTA of the chest.  Encouraged  the Samuel [name] to sit up in chair in the daytime use I-S and flutter valve for pulmonary toiletry and then prone in bed when at night.  Will advance activity and titrate down oxygen as possible.   SpO2: 98 % O2 Flow Rate (L/min): 40 L/min FiO2 (%): 100 %  Recent Labs  Lab 08/27/19 1156 08/28/19 0500 08/28/19 0500 08/29/19 0256 08/29/19 0256 08/30/19 0715 08/30/19 1127 08/31/19 0630 08/31/19 0646 09/01/19 0554 09/02/19 0245 09/03/19 0402  WBC  --  8.6   < > 8.9   < > 15.1*  --  18.2*  --  16.7* 13.5* 13.6*  CRP  --  1.6*   < > 0.9   < > 0.5  --  0.5  --  0.6 1.2* 4.0*  DDIMER  --  0.73*   < > 0.71*   < > 0.56*  --  0.50  --  0.50 0.64* 0.82*  BNP  --  39.9   < > 34.7   < > 43.9  --   --  52.9 36.5 25.6 32.8  PROCALCITON  --   --   --   --   --   --  <0.10 <0.10  --  <0.10  --   --   AST  --  121*   < > 60*   < > 43*  --  35  --  25 22 22   ALT  --  369*   < > 291*   < > 201*  --  144*  --  102* 69* 59*  ALKPHOS  --  82   < > 81   < > 73  --  74  --  69 65 67  BILITOT  --  0.8   < > 0.9   < > 1.0  --  0.9  --  1.1 1.1 0.6  ALBUMIN  --  3.2*   < > 3.3*   < > 3.4*  --  3.4*  --  3.2* 2.9* 3.0*  INR 0.9 0.9  --  1.0  --  1.0  --  1.0  --   --   --   --    < > = values in this interval not displayed.     Results for Samuel [name] (MRN [id number]) as of 09/03/2019 10:17  Ref. Range 08/27/2019 04:40 08/31/2019 06:21 09/03/2019 09:58  Sample type Unknown ARTERIAL ARTERIAL ARTERIAL DRAW  FIO2 Unknown 52.00 80.00 100.00  pH, Arterial Latest Ref Range: 7.35 - 7.45  7.466 (H) 7.493 (H) 7.429  pCO2 arterial Latest Ref Range: 32 - 48 mmHg 36.4 36.0 42.7  pO2, Arterial Latest Ref Range: 83 - 108 mmHg 46.7 (L) 58.6 (L) 62.1 (L)  Acid-Base Excess Latest Ref Range: 0.0 - 2.0 mmol/L 2.4 (H) 4.1 (H) 3.7 (H)  Bicarbonate Latest Ref Range: 20.0 - 28.0 mmol/L 26.0 27.5 27.8  O2 Saturation Latest Units: % 83.0 91.4 91.1  Samuel [name] temperature Unknown 36.6 36.5 37.0  Collection site Unknown  LEFT BRACHIAL RIGHT RADIAL RIGHT RADIAL  Allens test (pass/fail) Latest Ref Range: PASS  PASS PASS PASS     Productive cough developed 08/31/2019.  Question mild superimposed bacterial bronchitis or pneumonia.  Procalcitonin stable, 5 days of oral Levaquin and monitor.  Obtain MRSA PCR.    Rising LFTs.  Due to combination of COVID-19 infection, remdesivir and Actemra use.  Asymptomatic with stable right upper quadrant ultrasound and improving LFT trend.  GERD - PPI  Severe anxiety.  BID Klonopin. Will add Lexapro as well.  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  Right atrium and right ventricular pressures stable, discussed with cardiologist Samuel [name].  Outpatient follow-up.     Condition - Guarded ++  Family Communication  :  Wife Samuel [name] [phone] On 08/27/2019 at 11:15 AM, 08/28/19, 08/29/19, 08/30/19, 08/31/19, 09/01/19, 09/02/19, 09/03/19  Code Status :  Full  Consults  :  None  Procedures  :    TTE  -  1. Left ventricular ejection fraction, by estimation, is 60 to 65%. The left ventricle has normal function. The left ventricle has no regional wall motion abnormalities. There is mild left ventricular hypertrophy. Left ventricular diastolic parameters were normal.  2. Right ventricular systolic function is normal. The right ventricular size is normal.  3. The mitral valve is normal in structure. No evidence of mitral valve regurgitation. No evidence of mitral stenosis.  4. The aortic valve is normal in structure. Aortic valve regurgitation is not visualized. No aortic stenosis is present.  5. The inferior vena cava is normal in size with greater than 50% respiratory variability, suggesting right atrial pressure of 3 mmHg.    CTA  -  No PE  RUQ [place] - Non acute   PUD Prophylaxis : PPI  Disposition Plan  :    Status is: Inpatient  Remains inpatient appropriate because:IV treatments  appropriate due to intensity of illness or inability to take PO   Dispo: The Samuel [name] is from: Home              Anticipated d/c is to: Home              Anticipated d/c date is: > 3 days              Samuel [name] currently is not medically stable to d/c.   DVT Prophylaxis  :  Lovenox   Lab Results  Component Value Date   PLT 381 09/03/2019    Diet :  Diet Order            Diet regular Room service appropriate? Yes; Fluid consistency: Thin  Diet effective now                  Inpatient Medications  Scheduled Meds:  . (feeding supplement) PROSource Plus  30 mL Oral TID BM  . clonazePAM  1 mg Oral BID  . enoxaparin (LOVENOX) injection  40 mg Subcutaneous Daily  . escitalopram  5 mg Oral Daily  . feeding supplement  1 Container Oral TID BM  . Ipratropium-Albuterol  1 puff Inhalation TID  . levofloxacin  750 mg Oral Daily  . methylPREDNISolone (SOLU-MEDROL) injection  50 mg Intravenous Q12H  . multivitamin with minerals  1 tablet Oral Daily  . pantoprazole  40 mg Oral Daily   Continuous Infusions:  PRN Meds:.acetaminophen, chlorpheniramine-HYDROcodone, [DISCONTINUED] ondansetron **OR** ondansetron (ZOFRAN) IV, sodium chloride  Antibiotics  :    Anti-infectives (From admission, onward)   Start     Dose/Rate Route Frequency Ordered Stop   08/31/19 1100  levofloxacin (LEVAQUIN) tablet 750 mg        750 mg Oral Daily 08/31/19 1021 09/05/19 0959   08/25/19 1000  remdesivir 100 mg in sodium chloride 0.9 % 100 mL IVPB        100 mg 200 mL/hr over 30 Minutes Intravenous Daily 08/24/19 0357 08/28/19 0956   08/24/19 0400  remdesivir 100 mg in sodium chloride 0.9 % 100 mL IVPB        100 mg 200 mL/hr over 30 Minutes Intravenous Every 30 min 08/24/19 0357 08/24/19 0623       Time Spent in minutes  30   Susa Raring M.D on 09/03/2019 at  10:18 AM  To page go to [email or web address] - password [email or web address]  [facility] -  Office  [phone]   See all Orders from today for  further details    Objective:   Vitals:   09/02/19 2030 09/03/19 0537 09/03/19 0626 09/03/19 1012  BP: 120/83 109/76  121/79  Pulse: 93 81  97  Resp: (!) 23 (!) 21  20  Temp: 98.8 F (37.1 C) 98.1 F (36.7 C)    TempSrc: Oral Axillary    SpO2: 95% (!) 84% (!) 85% 98%  Weight:      Height:        Wt Readings from Last 3 Encounters:  08/25/19 92.1 kg     Intake/Output Summary (Last 24 hours) at 09/03/2019 1018 Last data filed at 09/03/2019 0129 Gross per 24 hour  Intake --  Output 1750 ml  Net -1750 ml     Physical Exam  Awake Alert, No new F.N deficits, flat affect [place].AT,PERRAL Supple Neck,No JVD, No cervical lymphadenopathy appriciated.  Symmetrical Chest wall movement, Good air movement bilaterally, few rales RRR,No Gallops, Rubs or new Murmurs, No Parasternal Heave +ve B.Sounds, Abd Soft, No tenderness, No organomegaly appriciated, No rebound - guarding or rigidity. No Cyanosis, Clubbing or edema, No new Rash or bruise    Data Review:    CBC Recent Labs  Lab 08/30/19 0715 08/31/19 0630 09/01/19 0554 09/02/19 0245 09/03/19 0402  WBC 15.1* 18.2* 16.7* 13.5* 13.6*  HGB 17.4* 17.7* 16.6 15.7 15.6  HCT 50.0 50.7 47.8 44.8 44.6  PLT 429* 469* 389 324 381  MCV 89.4 89.4 90.2 90.1 89.7  MCH 31.1 31.2 31.3 31.6 31.4  MCHC 34.8 34.9 34.7 35.0 35.0  RDW 11.7 11.7 11.7 11.8 11.7  LYMPHSABS 1.0 0.8 0.8 0.6* 0.6*  MONOABS 0.4 0.5 0.5 0.4 0.4  EOSABS 0.1 0.4 0.6* 0.6* 0.2  BASOSABS 0.0 0.1 0.0 0.0 0.0    Chemistries  Recent Labs  Lab 08/27/19 1156 08/28/19 0500 08/28/19 0500 08/29/19 0256 08/29/19 0256 08/30/19 0715 08/31/19 0630 09/01/19 0554 09/02/19 0245 09/03/19 0402  NA  --  134*   < > 133*   < > 132* 130* 132* 128* 133*  K  --  3.4*   < > 4.1   < > 3.6 3.7 3.8 3.3* 3.8  CL  --  95*   < > 94*   < > 94* 92* 91* 93* 95*  CO2  --  26   < > 26   < > 25 25 28 25 26   GLUCOSE  --  101*   < > 116*   < > 115* 109* 102* 102* 120*  BUN  --  28*   < >  29*   < > 29* 27* 21* 20 22*  CREATININE  --  0.92   < > 0.99   < > 1.10 1.05 1.03 1.02 1.08  CALCIUM  --  8.9   < > 8.8*   < > 8.9 8.8* 8.8* 8.4* 8.8*  AST  --  121*   < > 60*   < > 43* 35 25 22 22   ALT  --  369*   < > 291*   < > 201* 144* 102* 69* 59*  ALKPHOS  --  82   < > 81   < > 73 74 69 65 67  BILITOT  --  0.8   < > 0.9   < > 1.0 0.9 1.1 1.1 0.6  MG  --  2.5*   < > 2.6*   < > 2.5* 2.6* 2.5* 2.3 2.4  INR 0.9 0.9  --  1.0  --  1.0 1.0  --   --   --    < > = values in this interval not displayed.     ------------------------------------------------------------------------------------------------------------------ No results for input(s): CHOL, HDL, LDLCALC, TRIG, CHOLHDL, LDLDIRECT in the last 72 hours.  No results found for: HGBA1C ------------------------------------------------------------------------------------------------------------------ No results for input(s): TSH, T4TOTAL, T3FREE, THYROIDAB in the last 72 hours.  Invalid input(s): FREET3  Cardiac Enzymes No results for input(s): CKMB, TROPONINI, MYOGLOBIN in the last 168 hours.  Invalid input(s): CK ------------------------------------------------------------------------------------------------------------------    Component Value Date/Time   BNP 32.8 09/03/2019 0402    Micro Results Recent Results (from the past 240 hour(s))  MRSA PCR Screening     Status: None   Collection Time: 09/02/19 10:48 PM   Specimen: Nasal Mucosa; Nasopharyngeal  Result Value Ref Range Status   MRSA by PCR NEGATIVE NEGATIVE Final    Comment:        The GeneXpert MRSA Assay (FDA approved for NASAL specimens only), is one component of a comprehensive MRSA colonization surveillance program. It is not intended to diagnose MRSA infection nor to guide or monitor treatment for MRSA infections. Performed at [facility], [address], [place], [place] [address]     Radiology Reports CT ANGIO CHEST PE W OR WO  CONTRAST  Result Date: 08/27/2019 CLINICAL DATA:  COVID-19 infection, acute respiratory disease, concern for pulmonary embolus, short of breath EXAM: CT ANGIOGRAPHY CHEST WITH CONTRAST TECHNIQUE: Multidetector CT imaging of the chest was performed using the standard protocol during bolus administration of intravenous contrast. Multiplanar CT image reconstructions and MIPs were obtained to evaluate the vascular anatomy. CONTRAST:  80mL OMNIPAQUE IOHEXOL 350 MG/ML SOLN COMPARISON:  08/27/2019 FINDINGS: Cardiovascular: This is a technically adequate evaluation of the pulmonary vasculature. No filling defects or pulmonary emboli. The heart is unremarkable without pericardial effusion. Normal caliber of the thoracic aorta. Minimal atherosclerosis of the coronary vasculature. Incidental note is made partial anomalous pulmonary venous return with the left upper lobe pulmonary vein draining into the left brachiocephalic vein. Mediastinum/Nodes: No enlarged mediastinal, hilar, or axillary lymph nodes. Thyroid gland, trachea, and esophagus demonstrate no significant findings. There is a small hiatal hernia. Lungs/Pleura: Multifocal bilateral ground-glass airspace disease is identified, greatest in the mid and lower lung zones, consistent with multifocal pneumonia and history of COVID-19. No effusion or pneumothorax. Central airways are patent. Upper Abdomen: No acute abnormality. Musculoskeletal: No acute or destructive bony lesions. Reconstructed images demonstrate no additional findings. Review of the MIP images confirms the above findings. IMPRESSION: 1. No evidence of pulmonary embolus. 2. Multifocal bilateral ground-glass airspace disease, greatest in the mid and lower lung zones, consistent with multifocal pneumonia and history of COVID-19. 3. 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 4. Small hiatal hernia. Electronically  Signed   By: Sharlet Salina M.D.   On: 08/27/2019 15:52   DG Chest Port 1 View  Result Date: 09/03/2019 CLINICAL DATA:  Decreased oxygen saturation EXAM: PORTABLE CHEST 1 VIEW COMPARISON:  September 02, 2019 FINDINGS: Airspace opacity seen throughout both mid and lower lung regions, similar to 1 day prior. Heart size is upper normal with pulmonary vascularity normal. No adenopathy. No bone lesions. IMPRESSION: Stable airspace opacity throughout the mid and lower lung regions. Suspect atypical organism pneumonia. Appearance similar  to 1 day prior. Stable cardiac silhouette. Electronically Signed   By: Bretta Bang III M.D.   On: 09/03/2019 09:21   DG Chest Port 1 View  Result Date: 09/02/2019 CLINICAL DATA:  COVID-19.  Shortness of breath. EXAM: PORTABLE CHEST 1 VIEW COMPARISON:  August 31, 2019 FINDINGS: Increasing bilateral pulmonary infiltrates.  No other changes. IMPRESSION: Increasing bilateral pulmonary infiltrates consistent with worsening COVID-19 pneumonia. No other change. Electronically Signed   By: Gerome Sam III M.D   On: 09/02/2019 10:22   DG CHEST PORT 1 VIEW  Result Date: 08/31/2019 CLINICAL DATA:  Shortness of breath.  COVID. EXAM: PORTABLE CHEST 1 VIEW COMPARISON:  08/30/2019. FINDINGS: Mediastinum hilar structures normal. Heart size stable. Low lung volumes with persistent bibasilar pulmonary infiltrates. Similar findings on prior exam. No pleural effusion or pneumothorax. IMPRESSION: Low lung volumes with persistent bibasilar infiltrates in this known COVID positive Samuel [name]. Similar findings noted on prior exam. Electronically Signed   By: Maisie Fus  Register   On: 08/31/2019 07:16   DG Chest Port 1 View  Result Date: 08/30/2019 CLINICAL DATA:  COVID positive EXAM: PORTABLE CHEST 1 VIEW COMPARISON:  August 27, 2019 FINDINGS: Cardiomediastinal contours are partially obscured by increasing basilar opacities since the prior study. Lung volumes are diminished compared to the  previous exam. On limited assessment no acute skeletal process. IMPRESSION: 1. Worsening basilar airspace disease and slight decrease in lung volumes in the setting of COVID-19 pneumonia. Electronically Signed   By: Donzetta Kohut M.D.   On: 08/30/2019 08:17   DG Chest Port 1 View  Result Date: 08/27/2019 CLINICAL DATA:  Shortness of breath EXAM: PORTABLE CHEST 1 VIEW COMPARISON:  08/24/2019 chest radiograph. FINDINGS: Peripheral predominant streaky pulmonary opacities are unchanged. No pneumothorax or pleural effusion. Cardiomediastinal silhouette is unchanged. No acute osseous abnormality. IMPRESSION: Multifocal pneumonia, grossly unchanged. Electronically Signed   By: Stana Bunting M.D.   On: 08/27/2019 09:51   DG Chest Portable 1 View  Result Date: 08/24/2019 CLINICAL DATA:  Cough fever COVID positive EXAM: PORTABLE CHEST 1 VIEW COMPARISON:  None. FINDINGS: Streaky bilateral pulmonary opacities. Normal heart size. No pneumothorax or pleural effusion IMPRESSION: Streaky bilateral pulmonary opacities suspicious for bilateral pneumonia. Electronically Signed   By: Jasmine Pang M.D.   On: 08/24/2019 00:41   ECHOCARDIOGRAM COMPLETE  Result Date: 08/27/2019    ECHOCARDIOGRAM REPORT   Samuel [name] Name:   Samuel [name] [facility] Date of Exam: 08/27/2019 Medical Rec #:  [id number]                Height:       72.0 in Accession #:    [id number]               Weight:       203.0 lb Date of Birth:  16-Oct-1965                BSA:          2.144 m Samuel [name] Age:    54 years                 BP:           121/80 mmHg Samuel [name] Gender: M                        HR:           74 bpm. Exam Location:  Inpatient Procedure: 2D Echo and Intracardiac Opacification Agent Indications:    CHF-Acute Diastolic  428.31 / I50.31  History:        Samuel [name] has no prior history of Echocardiogram examinations.                 Signs/Symptoms:Shortness of Breath. Covid 19. GERD.  Sonographer:    Leta Jungling RDCS Referring Phys: Heide Scales [facility]  Sonographer Comments: Suboptimal apical window and suboptimal subcostal window. IMPRESSIONS  1. Left ventricular ejection fraction, by estimation, is 60 to 65%. The left ventricle has normal function. The left ventricle has no regional wall motion abnormalities. There is mild left ventricular hypertrophy. Left ventricular diastolic parameters were normal.  2. Right ventricular systolic function is normal. The right ventricular size is normal.  3. The mitral valve is normal in structure. No evidence of mitral valve regurgitation. No evidence of mitral stenosis.  4. The aortic valve is normal in structure. Aortic valve regurgitation is not visualized. No aortic stenosis is present.  5. The inferior vena cava is normal in size with greater than 50% respiratory variability, suggesting right atrial pressure of 3 mmHg. FINDINGS  Left Ventricle: Left ventricular ejection fraction, by estimation, is 60 to 65%. The left ventricle has normal function. The left ventricle has no regional wall motion abnormalities. Definity contrast agent was given IV to delineate the left ventricular  endocardial borders. The left ventricular internal cavity size was normal in size. There is mild left ventricular hypertrophy. Left ventricular diastolic parameters were normal. Right Ventricle: The right ventricular size is normal. No increase in right ventricular wall thickness. Right ventricular systolic function is normal. Left Atrium: Left atrial size was normal in size. Right Atrium: Right atrial size was normal in size. Pericardium: There is no evidence of pericardial effusion. Mitral Valve: The mitral valve is normal in structure. Normal mobility of the mitral valve leaflets. No evidence of mitral valve regurgitation. No evidence of mitral valve stenosis. Tricuspid Valve: The tricuspid valve is normal in structure. Tricuspid valve regurgitation is not demonstrated. No evidence of tricuspid stenosis. Aortic Valve: The  aortic valve is normal in structure. Aortic valve regurgitation is not visualized. No aortic stenosis is present. Pulmonic Valve: The pulmonic valve was normal in structure. Pulmonic valve regurgitation is not visualized. No evidence of pulmonic stenosis. Aorta: The aortic root is normal in size and structure. Venous: The inferior vena cava is normal in size with greater than 50% respiratory variability, suggesting right atrial pressure of 3 mmHg. IAS/Shunts: The interatrial septum was not well visualized.  LEFT VENTRICLE PLAX 2D LVIDd:         4.20 cm  Diastology LVIDs:         3.00 cm  LV e' lateral:   4.90 cm/s LV PW:         0.70 cm  LV E/e' lateral: 14.1 LV IVS:        2.45 cm  LV e' medial:    4.57 cm/s LVOT diam:     2.30 cm  LV E/e' medial:  15.1 LV SV:         74 LV SV Index:   34 LVOT Area:     4.15 cm  LEFT ATRIUM         Index LA diam:    2.10 cm 0.98 cm/m  AORTIC VALVE LVOT Vmax:   57.20 cm/s LVOT Vmean:  59.100 cm/s LVOT VTI:    0.178 m  AORTA Ao Root diam: 3.70 cm MITRAL VALVE MV Area (PHT): 2.71 cm    SHUNTS MV Decel  Time: 280 msec    Systemic VTI:  0.18 m MV E velocity: 69.20 cm/s  Systemic Diam: 2.30 cm MV A velocity: 65.55 cm/s MV E/A ratio:  1.06 Charlton Haws MD Electronically signed by Charlton Haws MD Signature Date/Time: 08/27/2019/10:56:13 AM    Final    US Abdomen Limited RUQ  Result Date: 08/27/2019 CLINICAL DATA:  Elevated liver enzymes.  Inpatient. EXAM: ULTRASOUND ABDOMEN LIMITED RIGHT UPPER QUADRANT COMPARISON:  None. FINDINGS: Unable to turn Samuel [name] into decubitus position due to restraints, limiting assessment. Gallbladder: No gallstones or wall thickening visualized. No sonographic Murphy sign noted by sonographer. Common bile duct: CBD not discretely visualized. No evidence of intrahepatic biliary ductal dilatation. Liver: Liver parenchyma is top-normal in echogenicity. No liver surface irregularity. No liver masses. Portal vein is patent on color Doppler imaging with normal  direction of blood flow towards the liver. Other: None. IMPRESSION: Limited scan, see comments. No acute abnormality. No cholelithiasis. Electronically Signed   By: Delbert Phenix M.D.   On: 08/27/2019 18:31

## 2019-09-03 NOTE — Progress Notes (Signed)
Patient's 02 sats down 80 to 84% with HFNC 15l. Called raspatory on patient bed side. Started HHFNC 50L and fio2 100% with top of non breather 15L.Now 02 sats is 95 to 96%. Patient is alert and oriented, no distress noted. He is resting on the chair. Will continue monitor.

## 2019-09-04 LAB — CBC WITH DIFFERENTIAL/PLATELET
Abs Immature Granulocytes: 0.17 10*3/uL — ABNORMAL HIGH (ref 0.00–0.07)
Basophils Absolute: 0 10*3/uL (ref 0.0–0.1)
Basophils Relative: 0 %
Eosinophils Absolute: 0 10*3/uL (ref 0.0–0.5)
Eosinophils Relative: 0 %
HCT: 45 % (ref 39.0–52.0)
Hemoglobin: 15.6 g/dL (ref 13.0–17.0)
Immature Granulocytes: 1 %
Lymphocytes Relative: 4 %
Lymphs Abs: 0.7 10*3/uL (ref 0.7–4.0)
MCH: 31.2 pg (ref 26.0–34.0)
MCHC: 34.7 g/dL (ref 30.0–36.0)
MCV: 90 fL (ref 80.0–100.0)
Monocytes Absolute: 0.4 10*3/uL (ref 0.1–1.0)
Monocytes Relative: 2 %
Neutro Abs: 16.2 10*3/uL — ABNORMAL HIGH (ref 1.7–7.7)
Neutrophils Relative %: 93 %
Platelets: 406 10*3/uL — ABNORMAL HIGH (ref 150–400)
RBC: 5 MIL/uL (ref 4.22–5.81)
RDW: 11.6 % (ref 11.5–15.5)
WBC: 17.6 10*3/uL — ABNORMAL HIGH (ref 4.0–10.5)
nRBC: 0 % (ref 0.0–0.2)

## 2019-09-04 LAB — COMPREHENSIVE METABOLIC PANEL
ALT: 56 U/L — ABNORMAL HIGH (ref 0–44)
AST: 22 U/L (ref 15–41)
Albumin: 3.3 g/dL — ABNORMAL LOW (ref 3.5–5.0)
Alkaline Phosphatase: 68 U/L (ref 38–126)
Anion gap: 11 (ref 5–15)
BUN: 25 mg/dL — ABNORMAL HIGH (ref 6–20)
CO2: 27 mmol/L (ref 22–32)
Calcium: 9.1 mg/dL (ref 8.9–10.3)
Chloride: 94 mmol/L — ABNORMAL LOW (ref 98–111)
Creatinine, Ser: 1.07 mg/dL (ref 0.61–1.24)
GFR calc Af Amer: >60 mL/min (ref >60)
GFR calc non Af Amer: >60 mL/min (ref >60)
Glucose, Bld: 140 mg/dL — ABNORMAL HIGH (ref 70–99)
Potassium: 3.9 mmol/L (ref 3.5–5.1)
Sodium: 132 mmol/L — ABNORMAL LOW (ref 135–145)
Total Bilirubin: 0.7 mg/dL (ref 0.3–1.2)
Total Protein: 6.1 g/dL — ABNORMAL LOW (ref 6.5–8.1)

## 2019-09-04 LAB — MAGNESIUM: Magnesium: 2.4 mg/dL (ref 1.7–2.4)

## 2019-09-04 LAB — C-REACTIVE PROTEIN: CRP: 1.1 mg/dL — ABNORMAL HIGH (ref <1.0)

## 2019-09-04 LAB — D-DIMER, QUANTITATIVE: D-Dimer, Quant: 0.85 ug/mL-FEU — ABNORMAL HIGH (ref 0.00–0.50)

## 2019-09-04 LAB — BRAIN NATRIURETIC PEPTIDE: B Natriuretic Peptide: 70.7 pg/mL (ref 0.0–100.0)

## 2019-09-04 NOTE — Progress Notes (Signed)
Ambulatory O2: Pt was able to stand up with no dyspnea. But when pt marched in placed his O2 sat went down to 88% with 18L and 70%FiO2. Became dyspneic and fatigue. Pt recovered quickly.

## 2019-09-04 NOTE — Progress Notes (Signed)
PROGRESS NOTE                                                                                                                                                                                                             Patient Demographics:    Samuel [name], is a 54 y.o. male, DOB - 06-Nov-1965, VPX:[id number]  Outpatient Primary MD for the patient is Patient, [name]    LOS - 10  Admit date - 08/24/2019    Chief Complaint  Patient presents with  . Cough       Brief Narrative Thedore Pickel is a 54 y.o. male with no significant past medical history presents to the ER at bedside at [facility] with complaint of shortness of breath.  Patient started having upper respiratory tract-like symptoms and headache about a week ago and was diagnosed with COVID-19 infection.  He continued to get progressively short of breath and presented to the ER with severe hypoxia requiring 12 L of oxygen and was admitted to the hospital subsequently.   Subjective:   Patient in bed, appears comfortable, denies any headache, no fever, no chest pain or pressure, improved shortness of breath , no abdominal pain. No focal weakness.    Assessment  & Plan :     Acute Hypoxic Resp. Failure due to Acute Covid 19 Viral Pneumonitis during the ongoing 2020 Covid 19 Pandemic - he has severe disease and is unfortunately unvaccinated.  He was promptly placed on steroids, remdesivir and received Actemra on the day of admission.  Echocardiogram was non acute & - ve CTA of the chest.  There is an element of anxiety with his breathing pattern and intermittent inconsistent drop of pulse ox.  Currently on 18 L heated high flow with 70% FiO2 and symptom-free.  Continue to monitor his oxygenation and titrate down gradually.  Encouraged the patient to sit up in chair in the daytime use I-S and flutter valve for pulmonary toiletry and then prone in bed when at  night.  Will advance activity and titrate down oxygen as possible.   SpO2: 92 % O2 Flow Rate (L/min): 20 L/min FiO2 (%): 70 %    Recent Labs  Lab 08/29/19 0256 08/29/19 0256 08/30/19 0715 08/30/19 0715 08/30/19 1127 08/31/19 0630 08/31/19 4627 09/01/19 0554 09/02/19 0245 09/03/19 0402 09/04/19 0830  WBC 8.9   < > 15.1*   < >  --  18.2*  --  16.7* 13.5* 13.6* 17.6*  CRP 0.9   < > 0.5   < >  --  0.5  --  0.6 1.2* 4.0* 1.1*  DDIMER 0.71*   < > 0.56*   < >  --  0.50  --  0.50 0.64* 0.82* 0.85*  BNP 34.7   < > 43.9   < >  --   --  52.9 36.5 25.6 32.8 70.7  PROCALCITON  --   --   --   --  <0.10 <0.10  --  <0.10  --   --   --   AST 60*   < > 43*   < >  --  35  --  25 22 22 22   ALT 291*   < > 201*   < >  --  144*  --  102* 69* 59* 56*  ALKPHOS 81   < > 73   < >  --  74  --  69 65 67 68  BILITOT 0.9   < > 1.0   < >  --  0.9  --  1.1 1.1 0.6 0.7  ALBUMIN 3.3*   < > 3.4*   < >  --  3.4*  --  3.2* 2.9* 3.0* 3.3*  INR 1.0  --  1.0  --   --  1.0  --   --   --   --   --    < > = values in this interval not displayed.     Productive cough developed 08/31/2019.  Question mild superimposed bacterial bronchitis or pneumonia.  Procalcitonin stable, 5 days of oral Levaquin and monitor.  Obtain MRSA PCR.    Rising LFTs.  Due to combination of COVID-19 infection, remdesivir and Actemra use.  Asymptomatic with stable right upper quadrant ultrasound and improving LFT trend.  GERD - PPI  Severe anxiety.  BID Klonopin. Will add Lexapro as well.  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  Right atrium and right ventricular pressures stable, discussed with cardiologist Dr. Jones Broom.  Outpatient follow-up.     Condition - Guarded ++  Family Communication  :  Wife Inetta Fermo [phone] On 08/27/2019 at 11:15 AM, 08/28/19, 08/29/19, 08/30/19, 08/31/19, 09/01/19, 09/02/19, 09/03/19, 09/04/19  Code Status :   Full  Consults  :  None  Procedures  :    TTE  -  1. Left ventricular ejection fraction, by estimation, is 60 to 65%. The left ventricle has normal function. The left ventricle has no regional wall motion abnormalities. There is mild left ventricular hypertrophy. Left ventricular diastolic parameters were normal.  2. Right ventricular systolic function is normal. The right ventricular size is normal.  3. The mitral valve is normal in structure. No evidence of mitral valve regurgitation. No evidence of mitral stenosis.  4. The aortic valve is normal in structure. Aortic valve regurgitation is not visualized. No aortic stenosis is present.  5. The inferior vena cava is normal in size with greater than 50% respiratory variability, suggesting right atrial pressure of 3 mmHg.    CTA  -  No PE  RUQ [place] - Non acute   PUD Prophylaxis : PPI  Disposition Plan  :    Status is: Inpatient  Remains inpatient appropriate because:IV treatments appropriate due to intensity of illness or inability to  take PO   Dispo: The patient is from: Home              Anticipated d/c is to: Home              Anticipated d/c date is: > 3 days              Patient currently is not medically stable to d/c.   DVT Prophylaxis  :  Lovenox   Lab Results  Component Value Date   PLT 406 (H) 09/04/2019    Diet :  Diet Order            Diet regular Room service appropriate? Yes; Fluid consistency: Thin  Diet effective now                  Inpatient Medications  Scheduled Meds:  . (feeding supplement) PROSource Plus  30 mL Oral TID BM  . clonazePAM  1 mg Oral BID  . enoxaparin (LOVENOX) injection  40 mg Subcutaneous Daily  . escitalopram  5 mg Oral Daily  . feeding supplement  1 Container Oral TID BM  . Ipratropium-Albuterol  1 puff Inhalation TID  . methylPREDNISolone (SOLU-MEDROL) injection  50 mg Intravenous Q12H  . multivitamin with minerals  1 tablet Oral Daily  . pantoprazole  40 mg Oral Daily    Continuous Infusions:  PRN Meds:.acetaminophen, dextromethorphan-guaiFENesin, [DISCONTINUED] ondansetron **OR** ondansetron (ZOFRAN) IV, sodium chloride  Antibiotics  :    Anti-infectives (From admission, onward)   Start     Dose/Rate Route Frequency Ordered Stop   08/31/19 1100  levofloxacin (LEVAQUIN) tablet 750 mg        750 mg Oral Daily 08/31/19 1021 09/04/19 0939   08/25/19 1000  remdesivir 100 mg in sodium chloride 0.9 % 100 mL IVPB        100 mg 200 mL/hr over 30 Minutes Intravenous Daily 08/24/19 0357 08/28/19 0956   08/24/19 0400  remdesivir 100 mg in sodium chloride 0.9 % 100 mL IVPB        100 mg 200 mL/hr over 30 Minutes Intravenous Every 30 min 08/24/19 0357 08/24/19 0623       Time Spent in minutes  30   Susa Raring M.D on 09/04/2019 at 11:26 AM  To page go to [email or web address] - password [facility]  [facility] -  Office  [phone]   See all Orders from today for further details    Objective:   Vitals:   09/03/19 2100 09/04/19 0505 09/04/19 0758 09/04/19 0819  BP: 119/79 (!) 147/97 123/77   Pulse: 88 68 90   Resp: 19 17 (!) 21   Temp: 97.9 F (36.6 C) 97.9 F (36.6 C) 98 F (36.7 C)   TempSrc: Oral Oral Oral   SpO2: 93% 96% 91% 92%  Weight:      Height:        Wt Readings from Last 3 Encounters:  08/25/19 92.1 kg     Intake/Output Summary (Last 24 hours) at 09/04/2019 1126 Last data filed at 09/04/2019 0259 Gross per 24 hour  Intake --  Output 475 ml  Net -475 ml     Physical Exam  Awake Alert, No new F.N deficits, Normal affect [place].AT,PERRAL Supple Neck,No JVD, No cervical lymphadenopathy appriciated.  Symmetrical Chest wall movement, Good air movement bilaterally, CTAB RRR,No Gallops, Rubs or new Murmurs, No Parasternal Heave +ve B.Sounds, Abd Soft, No tenderness, No organomegaly appriciated, No rebound - guarding or rigidity. No Cyanosis, Clubbing or  edema, No new Rash or bruise     Data Review:    CBC Recent Labs   Lab 08/31/19 0630 09/01/19 0554 09/02/19 0245 09/03/19 0402 09/04/19 0830  WBC 18.2* 16.7* 13.5* 13.6* 17.6*  HGB 17.7* 16.6 15.7 15.6 15.6  HCT 50.7 47.8 44.8 44.6 45.0  PLT 469* 389 324 381 406*  MCV 89.4 90.2 90.1 89.7 90.0  MCH 31.2 31.3 31.6 31.4 31.2  MCHC 34.9 34.7 35.0 35.0 34.7  RDW 11.7 11.7 11.8 11.7 11.6  LYMPHSABS 0.8 0.8 0.6* 0.6* 0.7  MONOABS 0.5 0.5 0.4 0.4 0.4  EOSABS 0.4 0.6* 0.6* 0.2 0.0  BASOSABS 0.1 0.0 0.0 0.0 0.0    Chemistries  Recent Labs  Lab 08/29/19 0256 08/29/19 0256 08/30/19 0715 08/30/19 0715 08/31/19 0630 09/01/19 0554 09/02/19 0245 09/03/19 0402 09/04/19 0830  NA 133*   < > 132*   < > 130* 132* 128* 133* 132*  K 4.1   < > 3.6   < > 3.7 3.8 3.3* 3.8 3.9  CL 94*   < > 94*   < > 92* 91* 93* 95* 94*  CO2 26   < > 25   < > 25 28 25 26 27   GLUCOSE 116*   < > 115*   < > 109* 102* 102* 120* 140*  BUN 29*   < > 29*   < > 27* 21* 20 22* 25*  CREATININE 0.99   < > 1.10   < > 1.05 1.03 1.02 1.08 1.07  CALCIUM 8.8*   < > 8.9   < > 8.8* 8.8* 8.4* 8.8* 9.1  AST 60*   < > 43*   < > 35 25 22 22 22   ALT 291*   < > 201*   < > 144* 102* 69* 59* 56*  ALKPHOS 81   < > 73   < > 74 69 65 67 68  BILITOT 0.9   < > 1.0   < > 0.9 1.1 1.1 0.6 0.7  MG 2.6*   < > 2.5*   < > 2.6* 2.5* 2.3 2.4 2.4  INR 1.0  --  1.0  --  1.0  --   --   --   --    < > = values in this interval not displayed.     ------------------------------------------------------------------------------------------------------------------ No results for input(s): CHOL, HDL, LDLCALC, TRIG, CHOLHDL, LDLDIRECT in the last 72 hours.  No results found for: HGBA1C ------------------------------------------------------------------------------------------------------------------ No results for input(s): TSH, T4TOTAL, T3FREE, THYROIDAB in the last 72 hours.  Invalid input(s): FREET3  Cardiac Enzymes No results for input(s): CKMB, TROPONINI, MYOGLOBIN in the last 168 hours.  Invalid input(s):  CK ------------------------------------------------------------------------------------------------------------------    Component Value Date/Time   BNP 70.7 09/04/2019 0830    Micro Results Recent Results (from the past 240 hour(s))  MRSA PCR Screening     Status: None   Collection Time: 09/02/19 10:48 PM   Specimen: Nasal Mucosa; Nasopharyngeal  Result Value Ref Range Status   MRSA by PCR NEGATIVE NEGATIVE Final    Comment:        The GeneXpert MRSA Assay (FDA approved for NASAL specimens only), is one component of a comprehensive MRSA colonization surveillance program. It is not intended to diagnose MRSA infection nor to guide or monitor treatment for MRSA infections. Performed at [facility], [address], [place], [address] [place]   Expectorated sputum assessment w rflx to resp cult     Status: None  Collection Time: 09/02/19 10:49 PM   Specimen: Sputum  Result Value Ref Range Status   Specimen Description SPUTUM  Final   Special Requests NONE  Final   Sputum evaluation   Final    THIS SPECIMEN IS ACCEPTABLE FOR SPUTUM CULTURE Performed at [facility], [address], [place], [place] [address]    Report Status 09/03/2019 FINAL  Final  Culture, respiratory     Status: None (Preliminary result)   Collection Time: 09/02/19 10:49 PM   Specimen: SPU  Result Value Ref Range Status   Specimen Description SPUTUM  Final   Special Requests NONE Reflexed from [id number]  Final   Gram Stain   Final    ABUNDANT WBC PRESENT,BOTH PMN AND MONONUCLEAR MODERATE GRAM POSITIVE COCCI RARE GRAM VARIABLE ROD    Culture   Final    CULTURE REINCUBATED FOR BETTER GROWTH Performed at [facility], [address] [place], [place] [address]    Report Status PENDING  Incomplete    Radiology Reports CT ANGIO CHEST PE W OR WO CONTRAST  Result Date: 08/27/2019 CLINICAL DATA:  COVID-19 infection, acute respiratory disease, concern for pulmonary embolus, short of  breath EXAM: CT ANGIOGRAPHY CHEST WITH CONTRAST TECHNIQUE: Multidetector CT imaging of the chest was performed using the standard protocol during bolus administration of intravenous contrast. Multiplanar CT image reconstructions and MIPs were obtained to evaluate the vascular anatomy. CONTRAST:  80mL OMNIPAQUE IOHEXOL 350 MG/ML SOLN COMPARISON:  08/27/2019 FINDINGS: Cardiovascular: This is a technically adequate evaluation of the pulmonary vasculature. No filling defects or pulmonary emboli. The heart is unremarkable without pericardial effusion. Normal caliber of the thoracic aorta. Minimal atherosclerosis of the coronary vasculature. Incidental note is made partial anomalous pulmonary venous return with the left upper lobe pulmonary vein draining into the left brachiocephalic vein. Mediastinum/Nodes: No enlarged mediastinal, hilar, or axillary lymph nodes. Thyroid gland, trachea, and esophagus demonstrate no significant findings. There is a small hiatal hernia. Lungs/Pleura: Multifocal bilateral ground-glass airspace disease is identified, greatest in the mid and lower lung zones, consistent with multifocal pneumonia and history of COVID-19. No effusion or pneumothorax. Central airways are patent. Upper Abdomen: No acute abnormality. Musculoskeletal: No acute or destructive bony lesions. Reconstructed images demonstrate no additional findings. Review of the MIP images confirms the above findings. IMPRESSION: 1. No evidence of pulmonary embolus. 2. Multifocal bilateral ground-glass airspace disease, greatest in the mid and lower lung zones, consistent with multifocal pneumonia and history of COVID-19. 3. 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 4. Small hiatal hernia. Electronically Signed   By: Sharlet Salina M.D.   On: 08/27/2019 15:52   DG Chest Port 1 View  Result Date: 09/03/2019 CLINICAL DATA:  Decreased oxygen  saturation EXAM: PORTABLE CHEST 1 VIEW COMPARISON:  September 02, 2019 FINDINGS: Airspace opacity seen throughout both mid and lower lung regions, similar to 1 day prior. Heart size is upper normal with pulmonary vascularity normal. No adenopathy. No bone lesions. IMPRESSION: Stable airspace opacity throughout the mid and lower lung regions. Suspect atypical organism pneumonia. Appearance similar to 1 day prior. Stable cardiac silhouette. Electronically Signed   By: Bretta Bang III M.D.   On: 09/03/2019 09:21   DG Chest Port 1 View  Result Date: 09/02/2019 CLINICAL DATA:  COVID-19.  Shortness of breath. EXAM: PORTABLE CHEST 1 VIEW COMPARISON:  August 31, 2019 FINDINGS: Increasing bilateral pulmonary infiltrates.  No other changes. IMPRESSION: Increasing  bilateral pulmonary infiltrates consistent with worsening COVID-19 pneumonia. No other change. Electronically Signed   By: Gerome Samavid  Williams III M.D   On: 09/02/2019 10:22   DG CHEST PORT 1 VIEW  Result Date: 08/31/2019 CLINICAL DATA:  Shortness of breath.  COVID. EXAM: PORTABLE CHEST 1 VIEW COMPARISON:  08/30/2019. FINDINGS: Mediastinum hilar structures normal. Heart size stable. Low lung volumes with persistent bibasilar pulmonary infiltrates. Similar findings on prior exam. No pleural effusion or pneumothorax. IMPRESSION: Low lung volumes with persistent bibasilar infiltrates in this known COVID positive patient. Similar findings noted on prior exam. Electronically Signed   By: Maisie Fushomas  Register   On: 08/31/2019 07:16   DG Chest Port 1 View  Result Date: 08/30/2019 CLINICAL DATA:  COVID positive EXAM: PORTABLE CHEST 1 VIEW COMPARISON:  August 27, 2019 FINDINGS: Cardiomediastinal contours are partially obscured by increasing basilar opacities since the prior study. Lung volumes are diminished compared to the previous exam. On limited assessment no acute skeletal process. IMPRESSION: 1. Worsening basilar airspace disease and slight decrease in lung  volumes in the setting of COVID-19 pneumonia. Electronically Signed   By: Donzetta KohutGeoffrey  Wile M.D.   On: 08/30/2019 08:17   DG Chest Port 1 View  Result Date: 08/27/2019 CLINICAL DATA:  Shortness of breath EXAM: PORTABLE CHEST 1 VIEW COMPARISON:  08/24/2019 chest radiograph. FINDINGS: Peripheral predominant streaky pulmonary opacities are unchanged. No pneumothorax or pleural effusion. Cardiomediastinal silhouette is unchanged. No acute osseous abnormality. IMPRESSION: Multifocal pneumonia, grossly unchanged. Electronically Signed   By: Stana Buntinghikanele  Emekauwa M.D.   On: 08/27/2019 09:51   DG Chest Portable 1 View  Result Date: 08/24/2019 CLINICAL DATA:  Cough fever COVID positive EXAM: PORTABLE CHEST 1 VIEW COMPARISON:  None. FINDINGS: Streaky bilateral pulmonary opacities. Normal heart size. No pneumothorax or pleural effusion IMPRESSION: Streaky bilateral pulmonary opacities suspicious for bilateral pneumonia. Electronically Signed   By: Jasmine PangKim  Fujinaga M.D.   On: 08/24/2019 00:41   ECHOCARDIOGRAM COMPLETE  Result Date: 08/27/2019    ECHOCARDIOGRAM REPORT   Patient Name:   [facility] Date of Exam: 08/27/2019 Medical Rec #:  [id number]                Height:       72.0 in Accession #:    [id number]               Weight:       203.0 lb Date of Birth:  Apr 09, 1965                BSA:          2.144 m Patient Age:    54 years                 BP:           121/80 mmHg Patient Gender: M                        HR:           74 bpm. Exam Location:  Inpatient Procedure: 2D Echo and Intracardiac Opacification Agent Indications:    CHF-Acute Diastolic 428.31 / I50.31  History:        Patient has no prior history of Echocardiogram examinations.                 Signs/Symptoms:Shortness of Breath. Covid 19. GERD.  Sonographer:    Leta Junglingiffany Cooper RDCS Referring Phys: Heide [id number] Ottie Tillery K [facility]  Sonographer Comments: Suboptimal apical window  and suboptimal subcostal window. IMPRESSIONS  1. Left ventricular ejection  fraction, by estimation, is 60 to 65%. The left ventricle has normal function. The left ventricle has no regional wall motion abnormalities. There is mild left ventricular hypertrophy. Left ventricular diastolic parameters were normal.  2. Right ventricular systolic function is normal. The right ventricular size is normal.  3. The mitral valve is normal in structure. No evidence of mitral valve regurgitation. No evidence of mitral stenosis.  4. The aortic valve is normal in structure. Aortic valve regurgitation is not visualized. No aortic stenosis is present.  5. The inferior vena cava is normal in size with greater than 50% respiratory variability, suggesting right atrial pressure of 3 mmHg. FINDINGS  Left Ventricle: Left ventricular ejection fraction, by estimation, is 60 to 65%. The left ventricle has normal function. The left ventricle has no regional wall motion abnormalities. Definity contrast agent was given IV to delineate the left ventricular  endocardial borders. The left ventricular internal cavity size was normal in size. There is mild left ventricular hypertrophy. Left ventricular diastolic parameters were normal. Right Ventricle: The right ventricular size is normal. No increase in right ventricular wall thickness. Right ventricular systolic function is normal. Left Atrium: Left atrial size was normal in size. Right Atrium: Right atrial size was normal in size. Pericardium: There is no evidence of pericardial effusion. Mitral Valve: The mitral valve is normal in structure. Normal mobility of the mitral valve leaflets. No evidence of mitral valve regurgitation. No evidence of mitral valve stenosis. Tricuspid Valve: The tricuspid valve is normal in structure. Tricuspid valve regurgitation is not demonstrated. No evidence of tricuspid stenosis. Aortic Valve: The aortic valve is normal in structure. Aortic valve regurgitation is not visualized. No aortic stenosis is present. Pulmonic Valve: The pulmonic  valve was normal in structure. Pulmonic valve regurgitation is not visualized. No evidence of pulmonic stenosis. Aorta: The aortic root is normal in size and structure. Venous: The inferior vena cava is normal in size with greater than 50% respiratory variability, suggesting right atrial pressure of 3 mmHg. IAS/Shunts: The interatrial septum was not well visualized.  LEFT VENTRICLE PLAX 2D LVIDd:         4.20 cm  Diastology LVIDs:         3.00 cm  LV e' lateral:   4.90 cm/s LV PW:         0.70 cm  LV E/e' lateral: 14.1 LV IVS:        2.45 cm  LV e' medial:    4.57 cm/s LVOT diam:     2.30 cm  LV E/e' medial:  15.1 LV SV:         74 LV SV Index:   34 LVOT Area:     4.15 cm  LEFT ATRIUM         Index LA diam:    2.10 cm 0.98 cm/m  AORTIC VALVE LVOT Vmax:   57.20 cm/s LVOT Vmean:  59.100 cm/s LVOT VTI:    0.178 m  AORTA Ao Root diam: 3.70 cm MITRAL VALVE MV Area (PHT): 2.71 cm    SHUNTS MV Decel Time: 280 msec    Systemic VTI:  0.18 m MV E velocity: 69.20 cm/s  Systemic Diam: 2.30 cm MV A velocity: 65.55 cm/s MV E/A ratio:  1.06 Charlton Haws MD Electronically signed by Charlton Haws MD Signature Date/Time: 08/27/2019/10:56:13 AM    Final    US Abdomen Limited RUQ  Result Date: 08/27/2019 CLINICAL DATA:  Elevated liver enzymes.  Inpatient. EXAM: ULTRASOUND ABDOMEN LIMITED RIGHT UPPER QUADRANT COMPARISON:  None. FINDINGS: Unable to turn patient into decubitus position due to restraints, limiting assessment. Gallbladder: No gallstones or wall thickening visualized. No sonographic Murphy sign noted by sonographer. Common bile duct: CBD not discretely visualized. No evidence of intrahepatic biliary ductal dilatation. Liver: Liver parenchyma is top-normal in echogenicity. No liver surface irregularity. No liver masses. Portal vein is patent on color Doppler imaging with normal direction of blood flow towards the liver. Other: None. IMPRESSION: Limited scan, see comments. No acute abnormality. No cholelithiasis.  Electronically Signed   By: Delbert Phenix M.D.   On: 08/27/2019 18:31

## 2019-09-05 DIAGNOSIS — R748 Abnormal levels of other serum enzymes: Secondary | ICD-10-CM

## 2019-09-05 LAB — CBC WITH DIFFERENTIAL/PLATELET
Abs Immature Granulocytes: 0.26 10*3/uL — ABNORMAL HIGH (ref 0.00–0.07)
Basophils Absolute: 0.1 10*3/uL (ref 0.0–0.1)
Basophils Relative: 0 %
Eosinophils Absolute: 0 10*3/uL (ref 0.0–0.5)
Eosinophils Relative: 0 %
HCT: 46 % (ref 39.0–52.0)
Hemoglobin: 16 g/dL (ref 13.0–17.0)
Immature Granulocytes: 2 %
Lymphocytes Relative: 5 %
Lymphs Abs: 0.9 10*3/uL (ref 0.7–4.0)
MCH: 32.1 pg (ref 26.0–34.0)
MCHC: 34.8 g/dL (ref 30.0–36.0)
MCV: 92.2 fL (ref 80.0–100.0)
Monocytes Absolute: 0.5 10*3/uL (ref 0.1–1.0)
Monocytes Relative: 3 %
Neutro Abs: 15 10*3/uL — ABNORMAL HIGH (ref 1.7–7.7)
Neutrophils Relative %: 90 %
Platelets: 352 10*3/uL (ref 150–400)
RBC: 4.99 MIL/uL (ref 4.22–5.81)
RDW: 11.8 % (ref 11.5–15.5)
WBC: 16.7 10*3/uL — ABNORMAL HIGH (ref 4.0–10.5)
nRBC: 0.1 % (ref 0.0–0.2)

## 2019-09-05 LAB — BRAIN NATRIURETIC PEPTIDE: B Natriuretic Peptide: 135.5 pg/mL — ABNORMAL HIGH (ref 0.0–100.0)

## 2019-09-05 LAB — COMPREHENSIVE METABOLIC PANEL
ALT: 62 U/L — ABNORMAL HIGH (ref 0–44)
AST: 27 U/L (ref 15–41)
Albumin: 3.1 g/dL — ABNORMAL LOW (ref 3.5–5.0)
Alkaline Phosphatase: 66 U/L (ref 38–126)
Anion gap: 20 — ABNORMAL HIGH (ref 5–15)
BUN: 26 mg/dL — ABNORMAL HIGH (ref 6–20)
CO2: 18 mmol/L — ABNORMAL LOW (ref 22–32)
Calcium: 8.9 mg/dL (ref 8.9–10.3)
Chloride: 96 mmol/L — ABNORMAL LOW (ref 98–111)
Creatinine, Ser: 1.11 mg/dL (ref 0.61–1.24)
GFR calc Af Amer: >60 mL/min (ref >60)
GFR calc non Af Amer: >60 mL/min (ref >60)
Glucose, Bld: 152 mg/dL — ABNORMAL HIGH (ref 70–99)
Potassium: 3.8 mmol/L (ref 3.5–5.1)
Sodium: 134 mmol/L — ABNORMAL LOW (ref 135–145)
Total Bilirubin: 0.8 mg/dL (ref 0.3–1.2)
Total Protein: 6.1 g/dL — ABNORMAL LOW (ref 6.5–8.1)

## 2019-09-05 LAB — MAGNESIUM: Magnesium: 2.3 mg/dL (ref 1.7–2.4)

## 2019-09-05 LAB — C-REACTIVE PROTEIN: CRP: 0.6 mg/dL (ref <1.0)

## 2019-09-05 LAB — D-DIMER, QUANTITATIVE: D-Dimer, Quant: 0.49 ug/mL-FEU (ref 0.00–0.50)

## 2019-09-05 MED ORDER — SENNOSIDES-DOCUSATE SODIUM 8.6-50 MG PO TABS
2.0000 | ORAL_TABLET | Freq: Two times a day (BID) | ORAL | Status: DC
  Administered 2019-09-07: 2 via ORAL
  Filled 2019-09-05 (×5): qty 2

## 2019-09-05 MED ORDER — FUROSEMIDE 20 MG PO TABS
20.0000 mg | ORAL_TABLET | Freq: Once | ORAL | Status: AC
  Administered 2019-09-05: 20 mg via ORAL
  Filled 2019-09-05: qty 1

## 2019-09-05 NOTE — Progress Notes (Signed)
Nutrition Follow-up  DOCUMENTATION CODES:   Not applicable  INTERVENTION:  Continue 30 ml Prosource po TID, each supplement provides 100 kcal and 15 grams of protein.   Encourage adequate PO intake.   NUTRITION DIAGNOSIS:   Increased nutrient needs related to catabolic illness (acute respiratory disease due to COVID-19 virus infection) as evidenced by estimated needs; ongoing  GOAL:   Patient will meet greater than or equal to 90% of their needs; met  MONITOR:   PO intake, Supplement acceptance, Skin, Weight trends, Labs, I & O's  REASON FOR ASSESSMENT:   Malnutrition Screening Tool    ASSESSMENT:   54 year old male with no past medical history diagnosed with COVID-19 infection ~1 week ago presented with progressive SOB admitted with acute respiratory disease due to COVID-19 virus.  RD working remotely. Pt is currently on 20 L/min HFNC. Meal completion has been 100%. Pt currently has Boost Breeze ordered however has been refusing them. RD to discontinue. RD to continue with Prosource plus supplement to aid in increased caloric and protein needs. Labs and medications reviewed.   Diet Order:   Diet Order            Diet regular Room service appropriate? Yes; Fluid consistency: Thin  Diet effective now                 EDUCATION NEEDS:   No education needs have been identified at this time  Skin:  Skin Assessment: Reviewed RN Assessment  Last BM:  8/25  Height:   Ht Readings from Last 1 Encounters:  08/25/19 6' (1.829 m)    Weight:   Wt Readings from Last 1 Encounters:  08/25/19 92.1 kg    BMI:  Body mass index is 27.53 kg/m.  Estimated Nutritional Needs:   Kcal:  2200-2400  Protein:  115-125 grams  Fluid:  >/= 2 L/day  Corrin Parker, MS, RD, LDN RD pager number/after hours weekend pager number on Amion.

## 2019-09-05 NOTE — Progress Notes (Signed)
PROGRESS NOTE                                                                                                                                                                                                             Patient Demographics:    Samuel [name], is a 54 y.o. male, DOB - 1965-11-07, ZOX:[id number]  Outpatient Primary MD for the patient is Patient, [name]    LOS - 11  Admit date - 08/24/2019    Chief Complaint  Patient presents with  . Cough       Brief Narrative Samuel [name] is a 54 y.o. male with no significant past medical history presents to the ER at bedside at [facility] with complaint of shortness of breath.  Patient started having upper respiratory tract-like symptoms and headache about a week ago and was diagnosed with COVID-19 infection.  He continued to get progressively short of breath and presented to the ER with severe hypoxia requiring 12 L of oxygen and was admitted to the hospital subsequently.   Subjective:   Patient in bed, appears comfortable, denies any headache, no fever, no chest pain or pressure, improved shortness of breath , no abdominal pain. No focal weakness.    Assessment  & Plan :     Acute Hypoxic Resp. Failure due to Acute Covid 19 Viral Pneumonitis during the ongoing 2020 Covid 19 Pandemic - he has severe disease and is unfortunately unvaccinated.  He was promptly placed on steroids, remdesivir and received Actemra on the day of admission.  Echocardiogram was non acute & - ve CTA of the chest.  There is an element of anxiety with his breathing pattern and intermittent inconsistent drop of pulse ox.  Current oxygen requirement, he still on heated high flow 15 L/min, discussed with him again today, encouraged to stay out of bed, and to keep using incentive spirometry and flutter valve, and then consulted to prone once in bed.  BNP mildly elevated at 135, no evidence of  fluid retention or crackles on exam, will give low-dose Lasix today .  SpO2: (!) 89 % O2 Flow Rate (L/min): 20 L/min FiO2 (%): (S) 55 %    Recent Labs  Lab 08/30/19 0715 08/30/19 0715 08/30/19 1127 08/31/19 0630 08/31/19 0646 09/01/19 0554 09/02/19 0245 09/03/19 0402 09/04/19 0830 09/05/19 0930  WBC  15.1*   < >  --  18.2*   < > 16.7* 13.5* 13.6* 17.6* 16.7*  CRP 0.5   < >  --  0.5   < > 0.6 1.2* 4.0* 1.1* 0.6  DDIMER 0.56*   < >  --  0.50   < > 0.50 0.64* 0.82* 0.85* 0.49  BNP 43.9  --   --   --    < > 36.5 25.6 32.8 70.7 135.5*  PROCALCITON  --   --  <0.10 <0.10  --  <0.10  --   --   --   --   AST 43*   < >  --  35   < > 25 22 22 22 27   ALT 201*   < >  --  144*   < > 102* 69* 59* 56* 62*  ALKPHOS 73   < >  --  74   < > 69 65 67 68 66  BILITOT 1.0   < >  --  0.9   < > 1.1 1.1 0.6 0.7 0.8  ALBUMIN 3.4*   < >  --  3.4*   < > 3.2* 2.9* 3.0* 3.3* 3.1*  INR 1.0  --   --  1.0  --   --   --   --   --   --    < > = values in this interval not displayed.     Productive cough developed 08/31/2019.  Question mild superimposed bacterial bronchitis or pneumonia.  Procalcitonin stable, 5 days of oral Levaquin and monitor.  Obtain MRSA PCR.    Rising LFTs.  Due to combination of COVID-19 infection, remdesivir and Actemra use.  Asymptomatic with stable right upper quadrant ultrasound and improving LFT trend.  GERD - PPI  Severe anxiety.  BID Klonopin. Started on  Lexapro as well.  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  Right atrium and right ventricular pressures stable, discussed with cardiologist Dr. 09/02/2019.  Outpatient follow-up.     Condition - Guarded ++  Family Communication  :  D/W wife by phone  Code Status :  Full  Consults  :  None  Procedures  :    TTE  -  1. Left ventricular ejection fraction, by estimation, is 60 to 65%. The left ventricle has normal function. The left  ventricle has no regional wall motion abnormalities. There is mild left ventricular hypertrophy. Left ventricular diastolic parameters were normal.  2. Right ventricular systolic function is normal. The right ventricular size is normal.  3. The mitral valve is normal in structure. No evidence of mitral valve regurgitation. No evidence of mitral stenosis.  4. The aortic valve is normal in structure. Aortic valve regurgitation is not visualized. No aortic stenosis is present.  5. The inferior vena cava is normal in size with greater than 50% respiratory variability, suggesting right atrial pressure of 3 mmHg.    CTA  -  No PE  RUQ [place] - Non acute   PUD Prophylaxis : PPI  Disposition Plan  :    Status is: Inpatient  Remains inpatient appropriate because:IV treatments appropriate due to intensity of illness or inability to take PO   Dispo: The patient is from: Home              Anticipated d/c is to: Home              Anticipated  d/c date is: > 3 days              Patient currently is not medically stable to d/c.   DVT Prophylaxis  :  Lovenox   Lab Results  Component Value Date   PLT 352 09/05/2019    Diet :  Diet Order            Diet regular Room service appropriate? Yes; Fluid consistency: Thin  Diet effective now                  Inpatient Medications  Scheduled Meds:  . (feeding supplement) PROSource Plus  30 mL Oral TID BM  . clonazePAM  1 mg Oral BID  . enoxaparin (LOVENOX) injection  40 mg Subcutaneous Daily  . escitalopram  5 mg Oral Daily  . feeding supplement  1 Container Oral TID BM  . Ipratropium-Albuterol  1 puff Inhalation TID  . methylPREDNISolone (SOLU-MEDROL) injection  50 mg Intravenous Q12H  . multivitamin with minerals  1 tablet Oral Daily  . pantoprazole  40 mg Oral Daily   Continuous Infusions:  PRN Meds:.acetaminophen, dextromethorphan-guaiFENesin, [DISCONTINUED] ondansetron **OR** ondansetron (ZOFRAN) IV, sodium chloride  Antibiotics  :     Anti-infectives (From admission, onward)   Start     Dose/Rate Route Frequency Ordered Stop   08/31/19 1100  levofloxacin (LEVAQUIN) tablet 750 mg        750 mg Oral Daily 08/31/19 1021 09/04/19 0939   08/25/19 1000  remdesivir 100 mg in sodium chloride 0.9 % 100 mL IVPB        100 mg 200 mL/hr over 30 Minutes Intravenous Daily 08/24/19 0357 08/28/19 0956   08/24/19 0400  remdesivir 100 mg in sodium chloride 0.9 % 100 mL IVPB        100 mg 200 mL/hr over 30 Minutes Intravenous Every 30 min 08/24/19 0357 08/24/19 1610      Mliss Fritz Srihaan Mastrangelo M.D on 09/05/2019 at 2:23 PM  To page go to [email or web address]   [facility] -  Office  [phone]   See all Orders from today for further details    Objective:   Vitals:   09/04/19 2205 09/05/19 0400 09/05/19 0559 09/05/19 0834  BP:  97/73    Pulse:  63 80   Resp: Temp:  98.2 F (36.8 C)    TempSrc:  Oral    SpO2:  95% 93% (!) 89%  Weight:      Height:        Wt Readings from Last 3 Encounters:  08/25/19 92.1 kg     Intake/Output Summary (Last 24 hours) at 09/05/2019 1423 Last data filed at 09/05/2019 1407 Gross per 24 hour  Intake 480 ml  Output 1575 ml  Net -1095 ml     Physical Exam  Awake Alert, Oriented X 3, No new F.N deficits, Normal affect, sitting in recliner Symmetrical Chest wall movement, Good air movement bilaterally, CTAB RRR,No Gallops,Rubs or new Murmurs, No Parasternal Heave +ve B.Sounds, Abd Soft, No tenderness, No rebound - guarding or rigidity. No Cyanosis, Clubbing or edema, No new Rash or bruise       Data Review:    CBC Recent Labs  Lab 09/01/19 0554 09/02/19 0245 09/03/19 0402 09/04/19 0830 09/05/19 0930  WBC 16.7* 13.5* 13.6* 17.6* 16.7*  HGB 16.6 15.7 15.6 15.6 16.0  HCT 47.8 44.8 44.6 45.0 46.0  PLT 389 324 381 406* 352  MCV 90.2 90.1 89.7 90.0 92.2  MCH 31.3 31.6 31.4 31.2 32.1  MCHC 34.7 35.0 35.0 34.7 34.8  RDW 11.7 11.8 11.7 11.6 11.8  LYMPHSABS 0.8 0.6*  0.6* 0.7 0.9  MONOABS 0.5 0.4 0.4 0.4 0.5  EOSABS 0.6* 0.6* 0.2 0.0 0.0  BASOSABS 0.0 0.0 0.0 0.0 0.1    Chemistries  Recent Labs  Lab 08/30/19 0715 08/30/19 0715 08/31/19 0630 08/31/19 0630 09/01/19 0554 09/02/19 0245 09/03/19 0402 09/04/19 0830 09/05/19 0930  NA 132*   < > 130*   < > 132* 128* 133* 132* 134*  K 3.6   < > 3.7   < > 3.8 3.3* 3.8 3.9 3.8  CL 94*   < > 92*   < > 91* 93* 95* 94* 96*  CO2 25   < > 25   < > 28 25 26 27  18*  GLUCOSE 115*   < > 109*   < > 102* 102* 120* 140* 152*  BUN 29*   < > 27*   < > 21* 20 22* 25* 26*  CREATININE 1.10   < > 1.05   < > 1.03 1.02 1.08 1.07 1.11  CALCIUM 8.9   < > 8.8*   < > 8.8* 8.4* 8.8* 9.1 8.9  AST 43*   < > 35   < > 25 22 22 22 27   ALT 201*   < > 144*   < > 102* 69* 59* 56* 62*  ALKPHOS 73   < > 74   < > 69 65 67 68 66  BILITOT 1.0   < > 0.9   < > 1.1 1.1 0.6 0.7 0.8  MG 2.5*   < > 2.6*   < > 2.5* 2.3 2.4 2.4 2.3  INR 1.0  --  1.0  --   --   --   --   --   --    < > = values in this interval not displayed.     ------------------------------------------------------------------------------------------------------------------ No results for input(s): CHOL, HDL, LDLCALC, TRIG, CHOLHDL, LDLDIRECT in the last 72 hours.  No results found for: HGBA1C ------------------------------------------------------------------------------------------------------------------ No results for input(s): TSH, T4TOTAL, T3FREE, THYROIDAB in the last 72 hours.  Invalid input(s): FREET3  Cardiac Enzymes No results for input(s): CKMB, TROPONINI, MYOGLOBIN in the last 168 hours.  Invalid input(s): CK ------------------------------------------------------------------------------------------------------------------    Component Value Date/Time   BNP 135.5 (H) 09/05/2019 0930    Micro Results Recent Results (from the past 240 hour(s))  MRSA PCR Screening     Status: None   Collection Time: 09/02/19 10:48 PM   Specimen: Nasal Mucosa;  Nasopharyngeal  Result Value Ref Range Status   MRSA by PCR NEGATIVE NEGATIVE Final    Comment:        The GeneXpert MRSA Assay (FDA approved for NASAL specimens only), is one component of a comprehensive MRSA colonization surveillance program. It is not intended to diagnose MRSA infection nor to guide or monitor treatment for MRSA infections. Performed at [facility], [address] [place], [address] [place]   Expectorated sputum assessment w rflx to resp cult     Status: None   Collection Time: 09/02/19 10:49 PM   Specimen: Sputum  Result Value Ref Range Status   Specimen Description SPUTUM  Final   Special Requests NONE  Final   Sputum evaluation   Final    THIS SPECIMEN IS ACCEPTABLE FOR SPUTUM CULTURE Performed at [facility], [address] [place], [address] [place]  Report Status 09/03/2019 FINAL  Final  Culture, respiratory     Status: None (Preliminary result)   Collection Time: 09/02/19 10:49 PM   Specimen: SPU  Result Value Ref Range Status   Specimen Description SPUTUM  Final   Special Requests NONE Reflexed from [id number]  Final   Gram Stain   Final    ABUNDANT WBC PRESENT,BOTH PMN AND MONONUCLEAR MODERATE GRAM POSITIVE COCCI RARE GRAM VARIABLE ROD    Culture   Final    CULTURE REINCUBATED FOR BETTER GROWTH Performed at [facility], [address] [place], [place] [address]    Report Status PENDING  Incomplete    Radiology Reports CT ANGIO CHEST PE W OR WO CONTRAST  Result Date: 08/27/2019 CLINICAL DATA:  COVID-19 infection, acute respiratory disease, concern for pulmonary embolus, short of breath EXAM: CT ANGIOGRAPHY CHEST WITH CONTRAST TECHNIQUE: Multidetector CT imaging of the chest was performed using the standard protocol during bolus administration of intravenous contrast. Multiplanar CT image reconstructions and MIPs were obtained to evaluate the vascular anatomy. CONTRAST:  80mL OMNIPAQUE IOHEXOL 350 MG/ML SOLN COMPARISON:   08/27/2019 FINDINGS: Cardiovascular: This is a technically adequate evaluation of the pulmonary vasculature. No filling defects or pulmonary emboli. The heart is unremarkable without pericardial effusion. Normal caliber of the thoracic aorta. Minimal atherosclerosis of the coronary vasculature. Incidental note is made partial anomalous pulmonary venous return with the left upper lobe pulmonary vein draining into the left brachiocephalic vein. Mediastinum/Nodes: No enlarged mediastinal, hilar, or axillary lymph nodes. Thyroid gland, trachea, and esophagus demonstrate no significant findings. There is a small hiatal hernia. Lungs/Pleura: Multifocal bilateral ground-glass airspace disease is identified, greatest in the mid and lower lung zones, consistent with multifocal pneumonia and history of COVID-19. No effusion or pneumothorax. Central airways are patent. Upper Abdomen: No acute abnormality. Musculoskeletal: No acute or destructive bony lesions. Reconstructed images demonstrate no additional findings. Review of the MIP images confirms the above findings. IMPRESSION: 1. No evidence of pulmonary embolus. 2. Multifocal bilateral ground-glass airspace disease, greatest in the mid and lower lung zones, consistent with multifocal pneumonia and history of COVID-19. 3. 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 4. Small hiatal hernia. Electronically Signed   By: Sharlet Salina M.D.   On: 08/27/2019 15:52   DG Chest Port 1 View  Result Date: 09/03/2019 CLINICAL DATA:  Decreased oxygen saturation EXAM: PORTABLE CHEST 1 VIEW COMPARISON:  September 02, 2019 FINDINGS: Airspace opacity seen throughout both mid and lower lung regions, similar to 1 day prior. Heart size is upper normal with pulmonary vascularity normal. No adenopathy. No bone lesions. IMPRESSION: Stable airspace opacity throughout the mid and lower lung regions. Suspect  atypical organism pneumonia. Appearance similar to 1 day prior. Stable cardiac silhouette. Electronically Signed   By: Bretta Bang III M.D.   On: 09/03/2019 09:21   DG Chest Port 1 View  Result Date: 09/02/2019 CLINICAL DATA:  COVID-19.  Shortness of breath. EXAM: PORTABLE CHEST 1 VIEW COMPARISON:  August 31, 2019 FINDINGS: Increasing bilateral pulmonary infiltrates.  No other changes. IMPRESSION: Increasing bilateral pulmonary infiltrates consistent with worsening COVID-19 pneumonia. No other change. Electronically Signed   By: Gerome Sam III M.D   On: 09/02/2019 10:22   DG CHEST PORT 1 VIEW  Result Date: 08/31/2019 CLINICAL DATA:  Shortness of breath.  COVID. EXAM: PORTABLE CHEST 1 VIEW COMPARISON:  08/30/2019. FINDINGS: Mediastinum hilar structures normal. Heart size stable. Low  lung volumes with persistent bibasilar pulmonary infiltrates. Similar findings on prior exam. No pleural effusion or pneumothorax. IMPRESSION: Low lung volumes with persistent bibasilar infiltrates in this known COVID positive patient. Similar findings noted on prior exam. Electronically Signed   By: Maisie Fus  Register   On: 08/31/2019 07:16   DG Chest Port 1 View  Result Date: 08/30/2019 CLINICAL DATA:  COVID positive EXAM: PORTABLE CHEST 1 VIEW COMPARISON:  August 27, 2019 FINDINGS: Cardiomediastinal contours are partially obscured by increasing basilar opacities since the prior study. Lung volumes are diminished compared to the previous exam. On limited assessment no acute skeletal process. IMPRESSION: 1. Worsening basilar airspace disease and slight decrease in lung volumes in the setting of COVID-19 pneumonia. Electronically Signed   By: Donzetta Kohut M.D.   On: 08/30/2019 08:17   DG Chest Port 1 View  Result Date: 08/27/2019 CLINICAL DATA:  Shortness of breath EXAM: PORTABLE CHEST 1 VIEW COMPARISON:  08/24/2019 chest radiograph. FINDINGS: Peripheral predominant streaky pulmonary opacities are unchanged. No  pneumothorax or pleural effusion. Cardiomediastinal silhouette is unchanged. No acute osseous abnormality. IMPRESSION: Multifocal pneumonia, grossly unchanged. Electronically Signed   By: Stana Bunting M.D.   On: 08/27/2019 09:51   DG Chest Portable 1 View  Result Date: 08/24/2019 CLINICAL DATA:  Cough fever COVID positive EXAM: PORTABLE CHEST 1 VIEW COMPARISON:  None. FINDINGS: Streaky bilateral pulmonary opacities. Normal heart size. No pneumothorax or pleural effusion IMPRESSION: Streaky bilateral pulmonary opacities suspicious for bilateral pneumonia. Electronically Signed   By: Jasmine Pang M.D.   On: 08/24/2019 00:41   ECHOCARDIOGRAM COMPLETE  Result Date: 08/27/2019    ECHOCARDIOGRAM REPORT   Patient Name:   RAIN FRIEDT [facility] Date of Exam: 08/27/2019 Medical Rec #:  [id number]                Height:       72.0 in Accession #:    [id number]               Weight:       203.0 lb Date of Birth:  Jan 24, 1965                BSA:          2.144 m Patient Age:    54 years                 BP:           121/80 mmHg Patient Gender: M                        HR:           74 bpm. Exam Location:  Inpatient Procedure: 2D Echo and Intracardiac Opacification Agent Indications:    CHF-Acute Diastolic 428.31 / I50.31  History:        Patient has no prior history of Echocardiogram examinations.                 Signs/Symptoms:Shortness of Breath. Covid 19. GERD.  Sonographer:    Leta Jungling RDCS Referring Phys: Heide Scales [facility]  Sonographer Comments: Suboptimal apical window and suboptimal subcostal window. IMPRESSIONS  1. Left ventricular ejection fraction, by estimation, is 60 to 65%. The left ventricle has normal function. The left ventricle has no regional wall motion abnormalities. There is mild left ventricular hypertrophy. Left ventricular diastolic parameters were normal.  2. Right ventricular systolic function is normal. The right ventricular size is normal.  3. The mitral  valve is normal in  structure. No evidence of mitral valve regurgitation. No evidence of mitral stenosis.  4. The aortic valve is normal in structure. Aortic valve regurgitation is not visualized. No aortic stenosis is present.  5. The inferior vena cava is normal in size with greater than 50% respiratory variability, suggesting right atrial pressure of 3 mmHg. FINDINGS  Left Ventricle: Left ventricular ejection fraction, by estimation, is 60 to 65%. The left ventricle has normal function. The left ventricle has no regional wall motion abnormalities. Definity contrast agent was given IV to delineate the left ventricular  endocardial borders. The left ventricular internal cavity size was normal in size. There is mild left ventricular hypertrophy. Left ventricular diastolic parameters were normal. Right Ventricle: The right ventricular size is normal. No increase in right ventricular wall thickness. Right ventricular systolic function is normal. Left Atrium: Left atrial size was normal in size. Right Atrium: Right atrial size was normal in size. Pericardium: There is no evidence of pericardial effusion. Mitral Valve: The mitral valve is normal in structure. Normal mobility of the mitral valve leaflets. No evidence of mitral valve regurgitation. No evidence of mitral valve stenosis. Tricuspid Valve: The tricuspid valve is normal in structure. Tricuspid valve regurgitation is not demonstrated. No evidence of tricuspid stenosis. Aortic Valve: The aortic valve is normal in structure. Aortic valve regurgitation is not visualized. No aortic stenosis is present. Pulmonic Valve: The pulmonic valve was normal in structure. Pulmonic valve regurgitation is not visualized. No evidence of pulmonic stenosis. Aorta: The aortic root is normal in size and structure. Venous: The inferior vena cava is normal in size with greater than 50% respiratory variability, suggesting right atrial pressure of 3 mmHg. IAS/Shunts: The interatrial septum was not well  visualized.  LEFT VENTRICLE PLAX 2D LVIDd:         4.20 cm  Diastology LVIDs:         3.00 cm  LV e' lateral:   4.90 cm/s LV PW:         0.70 cm  LV E/e' lateral: 14.1 LV IVS:        2.45 cm  LV e' medial:    4.57 cm/s LVOT diam:     2.30 cm  LV E/e' medial:  15.1 LV SV:         74 LV SV Index:   34 LVOT Area:     4.15 cm  LEFT ATRIUM         Index LA diam:    2.10 cm 0.98 cm/m  AORTIC VALVE LVOT Vmax:   57.20 cm/s LVOT Vmean:  59.100 cm/s LVOT VTI:    0.178 m  AORTA Ao Root diam: 3.70 cm MITRAL VALVE MV Area (PHT): 2.71 cm    SHUNTS MV Decel Time: 280 msec    Systemic VTI:  0.18 m MV E velocity: 69.20 cm/s  Systemic Diam: 2.30 cm MV A velocity: 65.55 cm/s MV E/A ratio:  1.06 Charlton HawsPeter Nishan MD Electronically signed by Charlton HawsPeter Nishan MD Signature Date/Time: 08/27/2019/10:56:13 AM    Final    US Abdomen Limited RUQ  Result Date: 08/27/2019 CLINICAL DATA:  Elevated liver enzymes.  Inpatient. EXAM: ULTRASOUND ABDOMEN LIMITED RIGHT UPPER QUADRANT COMPARISON:  None. FINDINGS: Unable to turn patient into decubitus position due to restraints, limiting assessment. Gallbladder: No gallstones or wall thickening visualized. No sonographic Murphy sign noted by sonographer. Common bile duct: CBD not discretely visualized. No evidence of intrahepatic biliary ductal dilatation. Liver: Liver parenchyma is top-normal in  echogenicity. No liver surface irregularity. No liver masses. Portal vein is patent on color Doppler imaging with normal direction of blood flow towards the liver. Other: None. IMPRESSION: Limited scan, see comments. No acute abnormality. No cholelithiasis. Electronically Signed   By: Delbert Phenix M.D.   On: 08/27/2019 18:31

## 2019-09-05 NOTE — Care Management (Signed)
Appointment made at COVID clinic 9/16 at 8am for initial follow up care. They will also assist with securing PCP. Appointment entered on AVS

## 2019-09-06 LAB — CULTURE, RESPIRATORY W GRAM STAIN: Culture: NORMAL

## 2019-09-06 LAB — CBC WITH DIFFERENTIAL/PLATELET
Abs Immature Granulocytes: 0.25 10*3/uL — ABNORMAL HIGH (ref 0.00–0.07)
Basophils Absolute: 0 10*3/uL (ref 0.0–0.1)
Basophils Relative: 0 %
Eosinophils Absolute: 0 10*3/uL (ref 0.0–0.5)
Eosinophils Relative: 0 %
HCT: 44.9 % (ref 39.0–52.0)
Hemoglobin: 15.4 g/dL (ref 13.0–17.0)
Immature Granulocytes: 2 %
Lymphocytes Relative: 5 %
Lymphs Abs: 0.8 10*3/uL (ref 0.7–4.0)
MCH: 31.4 pg (ref 26.0–34.0)
MCHC: 34.3 g/dL (ref 30.0–36.0)
MCV: 91.6 fL (ref 80.0–100.0)
Monocytes Absolute: 0.5 10*3/uL (ref 0.1–1.0)
Monocytes Relative: 3 %
Neutro Abs: 14.5 10*3/uL — ABNORMAL HIGH (ref 1.7–7.7)
Neutrophils Relative %: 90 %
Platelets: 369 10*3/uL (ref 150–400)
RBC: 4.9 MIL/uL (ref 4.22–5.81)
RDW: 11.9 % (ref 11.5–15.5)
WBC: 16.1 10*3/uL — ABNORMAL HIGH (ref 4.0–10.5)
nRBC: 0 % (ref 0.0–0.2)

## 2019-09-06 LAB — COMPREHENSIVE METABOLIC PANEL
ALT: 76 U/L — ABNORMAL HIGH (ref 0–44)
AST: 28 U/L (ref 15–41)
Albumin: 3.2 g/dL — ABNORMAL LOW (ref 3.5–5.0)
Alkaline Phosphatase: 63 U/L (ref 38–126)
Anion gap: 12 (ref 5–15)
BUN: 24 mg/dL — ABNORMAL HIGH (ref 6–20)
CO2: 29 mmol/L (ref 22–32)
Calcium: 8.9 mg/dL (ref 8.9–10.3)
Chloride: 94 mmol/L — ABNORMAL LOW (ref 98–111)
Creatinine, Ser: 1.21 mg/dL (ref 0.61–1.24)
GFR calc Af Amer: >60 mL/min (ref >60)
GFR calc non Af Amer: >60 mL/min (ref >60)
Glucose, Bld: 127 mg/dL — ABNORMAL HIGH (ref 70–99)
Potassium: 4.4 mmol/L (ref 3.5–5.1)
Sodium: 135 mmol/L (ref 135–145)
Total Bilirubin: 0.6 mg/dL (ref 0.3–1.2)
Total Protein: 6.1 g/dL — ABNORMAL LOW (ref 6.5–8.1)

## 2019-09-06 LAB — MAGNESIUM: Magnesium: 2.6 mg/dL — ABNORMAL HIGH (ref 1.7–2.4)

## 2019-09-06 LAB — C-REACTIVE PROTEIN: CRP: <0.5 mg/dL (ref <1.0)

## 2019-09-06 LAB — D-DIMER, QUANTITATIVE: D-Dimer, Quant: 0.5 ug/mL-FEU (ref 0.00–0.50)

## 2019-09-06 LAB — BRAIN NATRIURETIC PEPTIDE: B Natriuretic Peptide: 100.1 pg/mL — ABNORMAL HIGH (ref 0.0–100.0)

## 2019-09-06 MED ORDER — METHYLPREDNISOLONE SODIUM SUCC 125 MG IJ SOLR
50.0000 mg | Freq: Every day | INTRAMUSCULAR | Status: AC
  Administered 2019-09-06 – 2019-09-07 (×2): 50 mg via INTRAVENOUS
  Filled 2019-09-06 (×2): qty 2

## 2019-09-06 NOTE — Progress Notes (Signed)
PROGRESS NOTE                                                                                                                                                                                                             Patient Demographics:    Samuel [name], is a 54 y.o. male, DOB - August 29, 1965, MWU:[id number]  Outpatient Primary MD for the patient is Patient, [name]    LOS - 12  Admit date - 08/24/2019    Chief Complaint  Patient presents with  . Cough       Brief Narrative Samuel [name] is a 54 y.o. male with no significant past medical history presents to the ER at bedside at [facility] with complaint of shortness of breath.  Patient started having upper respiratory tract-like symptoms and headache about a week ago and was diagnosed with COVID-19 infection.  He continued to get progressively short of breath and presented to the ER with severe hypoxia requiring 12 L of oxygen and was admitted to the hospital subsequently.   Subjective:   Patient sitting in recliner, he appears comfortable, denies chest pain, fever or chills, still reporting cough, reports significant dyspnea with minimal activity.    Assessment  & Plan :     Acute Hypoxic Resp. Failure due to Acute Covid 19 Viral Pneumonitis during the ongoing 2020 Covid 19 Pandemic - - He has severe disease and is unfortunately unvaccinated. -Treated with IV steroids, he is on Solu-Medrol given severe disease.,  Will start tapering. -Treated with IV remdesivir. -He received Actemra on admission. - Echocardiogram was non acute & - ve CTA of the chest.  -Remains with significant oxygen requirement, he is on 20 L heated high flow today, which he is tolerating very well at rest, but he is significantly desaturate with minimal activity. -On Lasix as needed, no evidence of volume overload today, no indication for Lasix, he did receive 20 of p.o. Lasix  yesterday -Inflammatory marker has normalized, CRP less than 0.05, D-dimer is 0.5,.  SpO2: 93 % O2 Flow Rate (L/min): 20 L/min FiO2 (%): 50 %    Recent Labs  Lab 08/31/19 0630 08/31/19 0646 09/01/19 0554 09/01/19 0554 09/02/19 0245 09/03/19 0402 09/04/19 0830 09/05/19 0930 09/06/19 0332  WBC 18.2*   < > 16.7*   < > 13.5* 13.6* 17.6* 16.7*  16.1*  CRP 0.5   < > 0.6   < > 1.2* 4.0* 1.1* 0.6 <0.5  DDIMER 0.50   < > 0.50   < > 0.64* 0.82* 0.85* 0.49 0.50  BNP  --    < > 36.5   < > 25.6 32.8 70.7 135.5* 100.1*  PROCALCITON <0.10  --  <0.10  --   --   --   --   --   --   AST 35   < > 25   < > ALT 144*   < > 102*   < > 69* 59* 56* 62* 76*  ALKPHOS 74   < > 69   < > 65 67 68 66 63  BILITOT 0.9   < > 1.1   < > 1.1 0.6 0.7 0.8 0.6  ALBUMIN 3.4*   < > 3.2*   < > 2.9* 3.0* 3.3* 3.1* 3.2*  INR 1.0  --   --   --   --   --   --   --   --    < > = values in this interval not displayed.     Productive cough developed 08/31/2019.   - Question mild superimposed bacterial bronchitis or pneumonia.  Procalcitonin stable, treated with levofloxacin .  Rising LFTs.   -Due to Covid pneumonia, remdesivir and Actemra, it is with mild fluctuation, but overall is stable, will check intermittently . GERD - PPI  Severe anxiety.  BID Klonopin. Started on  Lexapro as well.  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  Right atrium and right ventricular pressures stable, discussed with cardiologist Dr. Jones Broom.  Outpatient follow-up.     Condition - Guarded ++  Family Communication  :  D/W wife by phone 8/25, left voicemail 8/26  Code Status :  Full  Consults  :  None  Procedures  :    TTE  -  1. Left ventricular ejection fraction, by estimation, is 60 to 65%. The left ventricle has normal function. The left ventricle has no regional wall motion abnormalities. There is mild left ventricular  hypertrophy. Left ventricular diastolic parameters were normal.  2. Right ventricular systolic function is normal. The right ventricular size is normal.  3. The mitral valve is normal in structure. No evidence of mitral valve regurgitation. No evidence of mitral stenosis.  4. The aortic valve is normal in structure. Aortic valve regurgitation is not visualized. No aortic stenosis is present.  5. The inferior vena cava is normal in size with greater than 50% respiratory variability, suggesting right atrial pressure of 3 mmHg.    CTA  -  No PE  RUQ [place] - Non acute   PUD Prophylaxis : PPI  Disposition Plan  :    Status is: Inpatient  Remains inpatient appropriate because:IV treatments appropriate due to intensity of illness or inability to take PO   Dispo: The patient is from: Home              Anticipated d/c is to: Home              Anticipated d/c date is: > 3 days              Patient currently is not medically stable to d/c.   DVT Prophylaxis  :  Lovenox   Lab Results  Component Value Date   PLT 369  09/06/2019    Diet :  Diet Order            Diet regular Room service appropriate? Yes; Fluid consistency: Thin  Diet effective now                  Inpatient Medications  Scheduled Meds:  . (feeding supplement) PROSource Plus  30 mL Oral TID BM  . clonazePAM  1 mg Oral BID  . enoxaparin (LOVENOX) injection  40 mg Subcutaneous Daily  . escitalopram  5 mg Oral Daily  . Ipratropium-Albuterol  1 puff Inhalation TID  . methylPREDNISolone (SOLU-MEDROL) injection  50 mg Intravenous Daily  . multivitamin with minerals  1 tablet Oral Daily  . pantoprazole  40 mg Oral Daily  . senna-docusate  2 tablet Oral BID   Continuous Infusions:  PRN Meds:.acetaminophen, dextromethorphan-guaiFENesin, [DISCONTINUED] ondansetron **OR** ondansetron (ZOFRAN) IV, sodium chloride  Antibiotics  :    Anti-infectives (From admission, onward)   Start     Dose/Rate Route Frequency Ordered  Stop   08/31/19 1100  levofloxacin (LEVAQUIN) tablet 750 mg        750 mg Oral Daily 08/31/19 1021 09/04/19 0939   08/25/19 1000  remdesivir 100 mg in sodium chloride 0.9 % 100 mL IVPB        100 mg 200 mL/hr over 30 Minutes Intravenous Daily 08/24/19 0357 08/28/19 0956   08/24/19 0400  remdesivir 100 mg in sodium chloride 0.9 % 100 mL IVPB        100 mg 200 mL/hr over 30 Minutes Intravenous Every 30 min 08/24/19 0357 08/24/19 1829      Mliss Fritz Bennye Nix M.D on 09/06/2019 at 1:28 PM  To page go to [email or web address]   [facility] -  Office  [phone]   See all Orders from today for further details    Objective:   Vitals:   09/06/19 0213 09/06/19 0400 09/06/19 0535 09/06/19 0803  BP:   108/73 115/78  Pulse: 68  67 98  Resp: 20  20 (!) 22  Temp:   98.1 F (36.7 C)   TempSrc:   Oral   SpO2: 97% 93% 93% 93%  Weight:      Height:        Wt Readings from Last 3 Encounters:  08/25/19 92.1 kg     Intake/Output Summary (Last 24 hours) at 09/06/2019 1328 Last data filed at 09/06/2019 0800 Gross per 24 hour  Intake 960 ml  Output 800 ml  Net 160 ml     Physical Exam  Awake Alert, Oriented X 3, No new F.N deficits, Normal affect Symmetrical Chest wall movement, Good air movement bilaterally, CTAB RRR,No Gallops,Rubs or new Murmurs, No Parasternal Heave +ve B.Sounds, Abd Soft, No tenderness, No rebound - guarding or rigidity. No Cyanosis, Clubbing or edema, No new Rash or bruise         Data Review:    CBC Recent Labs  Lab 09/02/19 0245 09/03/19 0402 09/04/19 0830 09/05/19 0930 09/06/19 0332  WBC 13.5* 13.6* 17.6* 16.7* 16.1*  HGB 15.7 15.6 15.6 16.0 15.4  HCT 44.8 44.6 45.0 46.0 44.9  PLT 324 381 406* 352 369  MCV 90.1 89.7 90.0 92.2 91.6  MCH 31.6 31.4 31.2 32.1 31.4  MCHC 35.0 35.0 34.7 34.8 34.3  RDW 11.8 11.7 11.6 11.8 11.9  LYMPHSABS 0.6* 0.6* 0.7 0.9 0.8  MONOABS 0.4 0.4 0.4 0.5 0.5  EOSABS 0.6* 0.2 0.0 0.0 0.0  BASOSABS 0.0 0.0 0.0 0.1  0.0  Chemistries  Recent Labs  Lab 08/31/19 0630 09/01/19 0554 09/02/19 0245 09/03/19 0402 09/04/19 0830 09/05/19 0930 09/06/19 0332  NA 130*   < > 128* 133* 132* 134* 135  K 3.7   < > 3.3* 3.8 3.9 3.8 4.4  CL 92*   < > 93* 95* 94* 96* 94*  CO2 25   < > 25 26 27  18* 29  GLUCOSE 109*   < > 102* 120* 140* 152* 127*  BUN 27*   < > 20 22* 25* 26* 24*  CREATININE 1.05   < > 1.02 1.08 1.07 1.11 1.21  CALCIUM 8.8*   < > 8.4* 8.8* 9.1 8.9 8.9  AST 35   < > 22 22 22 27 28   ALT 144*   < > 69* 59* 56* 62* 76*  ALKPHOS 74   < > 65 67 68 66 63  BILITOT 0.9   < > 1.1 0.6 0.7 0.8 0.6  MG 2.6*   < > 2.3 2.4 2.4 2.3 2.6*  INR 1.0  --   --   --   --   --   --    < > = values in this interval not displayed.     ------------------------------------------------------------------------------------------------------------------ No results for input(s): CHOL, HDL, LDLCALC, TRIG, CHOLHDL, LDLDIRECT in the last 72 hours.  No results found for: HGBA1C ------------------------------------------------------------------------------------------------------------------ No results for input(s): TSH, T4TOTAL, T3FREE, THYROIDAB in the last 72 hours.  Invalid input(s): FREET3  Cardiac Enzymes No results for input(s): CKMB, TROPONINI, MYOGLOBIN in the last 168 hours.  Invalid input(s): CK ------------------------------------------------------------------------------------------------------------------    Component Value Date/Time   BNP 100.1 (H) 09/06/2019    Micro Results Recent Results (from the past 240 hour(s))  MRSA PCR Screening     Status: None   Collection Time: 09/02/19 10:48 PM   Specimen: Nasal Mucosa; Nasopharyngeal  Result Value Ref Range Status   MRSA by PCR NEGATIVE NEGATIVE Final    Comment:        The GeneXpert MRSA Assay (FDA approved for NASAL specimens only), is one component of a comprehensive MRSA colonization surveillance program. It is not intended to diagnose  MRSA infection nor to guide or monitor treatment for MRSA infections. Performed at [facility], [address] [place], [address] [place]   Expectorated sputum assessment w rflx to resp cult     Status: None   Collection Time: 09/02/19 10:49 PM   Specimen: Sputum  Result Value Ref Range Status   Specimen Description SPUTUM  Final   Special Requests NONE  Final   Sputum evaluation   Final    THIS SPECIMEN IS ACCEPTABLE FOR SPUTUM CULTURE Performed at [facility], [address], [place], [address] [place]    Report Status 09/03/2019 FINAL  Final  Culture, respiratory     Status: None   Collection Time: 09/02/19 10:49 PM   Specimen: SPU  Result Value Ref Range Status   Specimen Description SPUTUM  Final   Special Requests NONE Reflexed from 09/05/2019  Final   Gram Stain   Final    ABUNDANT WBC PRESENT,BOTH PMN AND MONONUCLEAR MODERATE GRAM POSITIVE COCCI RARE GRAM VARIABLE ROD    Culture   Final    FEW Normal respiratory flora-no Staph aureus or Pseudomonas seen Performed at [facility], [address] [place], [address] [place]    Report Status 09/06/2019 FINAL  Final    Radiology Reports CT ANGIO CHEST PE W  OR WO CONTRAST  Result Date: 08/27/2019 CLINICAL DATA:  COVID-19 infection, acute respiratory disease, concern for pulmonary embolus, short of breath EXAM: CT ANGIOGRAPHY CHEST WITH CONTRAST TECHNIQUE: Multidetector CT imaging of the chest was performed using the standard protocol during bolus administration of intravenous contrast. Multiplanar CT image reconstructions and MIPs were obtained to evaluate the vascular anatomy. CONTRAST:  21mL OMNIPAQUE IOHEXOL 350 MG/ML SOLN COMPARISON:  08/27/2019 FINDINGS: Cardiovascular: This is a technically adequate evaluation of the pulmonary vasculature. No filling defects or pulmonary emboli. The heart is unremarkable without pericardial effusion. Normal caliber of the thoracic aorta. Minimal atherosclerosis of the  coronary vasculature. Incidental note is made partial anomalous pulmonary venous return with the left upper lobe pulmonary vein draining into the left brachiocephalic vein. Mediastinum/Nodes: No enlarged mediastinal, hilar, or axillary lymph nodes. Thyroid gland, trachea, and esophagus demonstrate no significant findings. There is a small hiatal hernia. Lungs/Pleura: Multifocal bilateral ground-glass airspace disease is identified, greatest in the mid and lower lung zones, consistent with multifocal pneumonia and history of COVID-19. No effusion or pneumothorax. Central airways are patent. Upper Abdomen: No acute abnormality. Musculoskeletal: No acute or destructive bony lesions. Reconstructed images demonstrate no additional findings. Review of the MIP images confirms the above findings. IMPRESSION: 1. No evidence of pulmonary embolus. 2. Multifocal bilateral ground-glass airspace disease, greatest in the mid and lower lung zones, consistent with multifocal pneumonia and history of COVID-19. 3. 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 4. Small hiatal hernia. Electronically Signed   By: Sharlet Salina M.D.   On: 08/27/2019 15:52   DG Chest Port 1 View  Result Date: 09/03/2019 CLINICAL DATA:  Decreased oxygen saturation EXAM: PORTABLE CHEST 1 VIEW COMPARISON:  September 02, 2019 FINDINGS: Airspace opacity seen throughout both mid and lower lung regions, similar to 1 day prior. Heart size is upper normal with pulmonary vascularity normal. No adenopathy. No bone lesions. IMPRESSION: Stable airspace opacity throughout the mid and lower lung regions. Suspect atypical organism pneumonia. Appearance similar to 1 day prior. Stable cardiac silhouette. Electronically Signed   By: Bretta Bang III M.D.   On: 09/03/2019 09:21   DG Chest Port 1 View  Result Date: 09/02/2019 CLINICAL DATA:  COVID-19.  Shortness of breath. EXAM:  PORTABLE CHEST 1 VIEW COMPARISON:  August 31, 2019 FINDINGS: Increasing bilateral pulmonary infiltrates.  No other changes. IMPRESSION: Increasing bilateral pulmonary infiltrates consistent with worsening COVID-19 pneumonia. No other change. Electronically Signed   By: Gerome Sam III M.D   On: 09/02/2019 10:22   DG CHEST PORT 1 VIEW  Result Date: 08/31/2019 CLINICAL DATA:  Shortness of breath.  COVID. EXAM: PORTABLE CHEST 1 VIEW COMPARISON:  08/30/2019. FINDINGS: Mediastinum hilar structures normal. Heart size stable. Low lung volumes with persistent bibasilar pulmonary infiltrates. Similar findings on prior exam. No pleural effusion or pneumothorax. IMPRESSION: Low lung volumes with persistent bibasilar infiltrates in this known COVID positive patient. Similar findings noted on prior exam. Electronically Signed   By: Maisie Fus  Register   On: 08/31/2019 07:16   DG Chest Port 1 View  Result Date: 08/30/2019 CLINICAL DATA:  COVID positive EXAM: PORTABLE CHEST 1 VIEW COMPARISON:  August 27, 2019 FINDINGS: Cardiomediastinal contours are partially obscured by increasing basilar opacities since the prior study. Lung volumes are diminished compared to the previous exam. On limited assessment no acute skeletal process. IMPRESSION: 1. Worsening basilar airspace disease and slight decrease in lung volumes in the setting  of COVID-19 pneumonia. Electronically Signed   By: Donzetta Kohut M.D.   On: 08/30/2019 08:17   DG Chest Port 1 View  Result Date: 08/27/2019 CLINICAL DATA:  Shortness of breath EXAM: PORTABLE CHEST 1 VIEW COMPARISON:  08/24/2019 chest radiograph. FINDINGS: Peripheral predominant streaky pulmonary opacities are unchanged. No pneumothorax or pleural effusion. Cardiomediastinal silhouette is unchanged. No acute osseous abnormality. IMPRESSION: Multifocal pneumonia, grossly unchanged. Electronically Signed   By: Stana Bunting M.D.   On: 08/27/2019 09:51   DG Chest Portable 1 View  Result  Date: 08/24/2019 CLINICAL DATA:  Cough fever COVID positive EXAM: PORTABLE CHEST 1 VIEW COMPARISON:  None. FINDINGS: Streaky bilateral pulmonary opacities. Normal heart size. No pneumothorax or pleural effusion IMPRESSION: Streaky bilateral pulmonary opacities suspicious for bilateral pneumonia. Electronically Signed   By: Jasmine Pang M.D.   On: 08/24/2019 00:41   ECHOCARDIOGRAM COMPLETE  Result Date: 08/27/2019    ECHOCARDIOGRAM REPORT   Patient Name:   DONDRELL LOUDERMILK [facility] Date of Exam: 08/27/2019 Medical Rec #:  [id number]                Height:       72.0 in Accession #:    [id number]               Weight:       203.0 lb Date of Birth:  08/21/1965                BSA:          2.144 m Patient Age:    54 years                 BP:           121/80 mmHg Patient Gender: M                        HR:           74 bpm. Exam Location:  Inpatient Procedure: 2D Echo and Intracardiac Opacification Agent Indications:    CHF-Acute Diastolic 428.31 / I50.31  History:        Patient has no prior history of Echocardiogram examinations.                 Signs/Symptoms:Shortness of Breath. Covid 19. GERD.  Sonographer:    Leta Jungling RDCS Referring Phys: Heide Scales [facility]  Sonographer Comments: Suboptimal apical window and suboptimal subcostal window. IMPRESSIONS  1. Left ventricular ejection fraction, by estimation, is 60 to 65%. The left ventricle has normal function. The left ventricle has no regional wall motion abnormalities. There is mild left ventricular hypertrophy. Left ventricular diastolic parameters were normal.  2. Right ventricular systolic function is normal. The right ventricular size is normal.  3. The mitral valve is normal in structure. No evidence of mitral valve regurgitation. No evidence of mitral stenosis.  4. The aortic valve is normal in structure. Aortic valve regurgitation is not visualized. No aortic stenosis is present.  5. The inferior vena cava is normal in size with greater than 50%  respiratory variability, suggesting right atrial pressure of 3 mmHg. FINDINGS  Left Ventricle: Left ventricular ejection fraction, by estimation, is 60 to 65%. The left ventricle has normal function. The left ventricle has no regional wall motion abnormalities. Definity contrast agent was given IV to delineate the left ventricular  endocardial borders. The left ventricular internal cavity size was normal in size. There is mild left ventricular hypertrophy. Left ventricular  diastolic parameters were normal. Right Ventricle: The right ventricular size is normal. No increase in right ventricular wall thickness. Right ventricular systolic function is normal. Left Atrium: Left atrial size was normal in size. Right Atrium: Right atrial size was normal in size. Pericardium: There is no evidence of pericardial effusion. Mitral Valve: The mitral valve is normal in structure. Normal mobility of the mitral valve leaflets. No evidence of mitral valve regurgitation. No evidence of mitral valve stenosis. Tricuspid Valve: The tricuspid valve is normal in structure. Tricuspid valve regurgitation is not demonstrated. No evidence of tricuspid stenosis. Aortic Valve: The aortic valve is normal in structure. Aortic valve regurgitation is not visualized. No aortic stenosis is present. Pulmonic Valve: The pulmonic valve was normal in structure. Pulmonic valve regurgitation is not visualized. No evidence of pulmonic stenosis. Aorta: The aortic root is normal in size and structure. Venous: The inferior vena cava is normal in size with greater than 50% respiratory variability, suggesting right atrial pressure of 3 mmHg. IAS/Shunts: The interatrial septum was not well visualized.  LEFT VENTRICLE PLAX 2D LVIDd:         4.20 cm  Diastology LVIDs:         3.00 cm  LV e' lateral:   4.90 cm/s LV PW:         0.70 cm  LV E/e' lateral: 14.1 LV IVS:        2.45 cm  LV e' medial:    4.57 cm/s LVOT diam:     2.30 cm  LV E/e' medial:  15.1 LV SV:          74 LV SV Index:   34 LVOT Area:     4.15 cm  LEFT ATRIUM         Index LA diam:    2.10 cm 0.98 cm/m  AORTIC VALVE LVOT Vmax:   57.20 cm/s LVOT Vmean:  59.100 cm/s LVOT VTI:    0.178 m  AORTA Ao Root diam: 3.70 cm MITRAL VALVE MV Area (PHT): 2.71 cm    SHUNTS MV Decel Time: 280 msec    Systemic VTI:  0.18 m MV E velocity: 69.20 cm/s  Systemic Diam: 2.30 cm MV A velocity: 65.55 cm/s MV E/A ratio:  1.06 Charlton HawsPeter Nishan MD Electronically signed by Charlton HawsPeter Nishan MD Signature Date/Time: 08/27/2019/10:56:13 AM    Final    US Abdomen Limited RUQ  Result Date: 08/27/2019 CLINICAL DATA:  Elevated liver enzymes.  Inpatient. EXAM: ULTRASOUND ABDOMEN LIMITED RIGHT UPPER QUADRANT COMPARISON:  None. FINDINGS: Unable to turn patient into decubitus position due to restraints, limiting assessment. Gallbladder: No gallstones or wall thickening visualized. No sonographic Murphy sign noted by sonographer. Common bile duct: CBD not discretely visualized. No evidence of intrahepatic biliary ductal dilatation. Liver: Liver parenchyma is top-normal in echogenicity. No liver surface irregularity. No liver masses. Portal vein is patent on color Doppler imaging with normal direction of blood flow towards the liver. Other: None. IMPRESSION: Limited scan, see comments. No acute abnormality. No cholelithiasis. Electronically Signed   By: Delbert PhenixJason A Poff M.D.   On: 08/27/2019 18:31

## 2019-09-06 NOTE — Plan of Care (Signed)
  Problem: Education: Goal: Knowledge of General Education information will improve Description: Including pain rating scale, medication(s)/side effects and non-pharmacologic comfort measures 09/06/2019 1631 by Lamar Sprinkles, RN Outcome: Progressing 09/06/2019 1624 by Lamar Sprinkles, RN Outcome: Progressing

## 2019-09-06 NOTE — TOC Progression Note (Signed)
Transition of Care [facility]) - Progression Note    Patient Details  Name: Linville Decarolis MRN: [id number] Date of Birth: May 27, 1965  Transition of [facility] CM/SW Contact  Lockie Pares, RN Phone Number: 09/06/2019, 3:29 PM  Clinical Narrative:     Patient admitted with COVID day 14 Continues on Heated Oxygen flow of 15L.  Will likely need home O2. CM will continue to follow for needs. Will need oxygen qualifications closer to discharge. For home oxygen  Expected Discharge Plan: Home/Self Care Barriers to Discharge: No Barriers Identified  Expected Discharge Plan and Services Expected Discharge Plan: Home/Self Care In-house Referral: NA Discharge Planning Services: CM Consult   Living arrangements for the past 2 months: Single Family Home                                       Social Determinants of Health (SDOH) Interventions    Readmission Risk Interventions No flowsheet data found.

## 2019-09-06 NOTE — Plan of Care (Signed)
  Problem: Education: Goal: Knowledge of General Education information will improve Description Including pain rating scale, medication(s)/side effects and non-pharmacologic comfort measures Outcome: Progressing   

## 2019-09-07 DIAGNOSIS — J1282 Pneumonia due to coronavirus disease 2019: Secondary | ICD-10-CM

## 2019-09-07 NOTE — Evaluation (Signed)
Physical Therapy Evaluation Patient Details Name: Samuel [name] MRN: [id number] DOB: April 23, 1965 Today's Date: 09/07/2019   History of Present Illness  Pt is a 54 y.o. male admitted 08/24/19 with SOB with recent (+) COVID-19 dx (pt unvaccinated). Workup for acute hypoxic respiratory failure due to acute COVID viral pneumonitis. No pertinent PMH.    Clinical Impression  Pt presents with an overall decrease in functional mobility secondary to above. PTA, pt independent, works, active and lives with wife. Initiated education re: current conditions, O2 needs, activity recommendations, positioning, therex, energy conservation, and importance of mobility. Today, pt able to perform multiple bouts of standing activity at supervision-level. SpO2 down to 86% on 20 L O2 HHFNC at 40% FiO2, HR 120s with activity. Pt would benefit from continued acute PT services to maximize functional mobility and independence prior to d/c home.    Follow Up Recommendations No PT follow up    Equipment Recommendations   (home O2?)    Recommendations for Other Services       Precautions / Restrictions Precautions Precautions: Fall;Other (comment) Precaution Comments: 20L HHFNC at 40% FiO2 Restrictions Weight Bearing Restrictions: No      Mobility  Bed Mobility               General bed mobility comments: Received sitting in recliner  Transfers Overall transfer level: Independent Equipment used: None             General transfer comment: Independent managing lines for standing and pivots from bed<>recliner  Ambulation/Gait Ambulation/Gait assistance: Supervision Gait Distance (Feet): 16 Feet Assistive device: None Gait Pattern/deviations: Step-through pattern;Decreased stride length     General Gait Details: Steps 4' forwards/4' backwards multiple times within confines of HHFNC lines; supervision for SpO2 monitoring, down to 86% on 20L HHFNC at 40% FiO2  Stairs             Wheelchair Mobility    Modified Rankin (Stroke Patients Only)       Balance Overall balance assessment: No apparent balance deficits (not formally assessed)                                           Pertinent Vitals/Pain Pain Assessment: No/denies pain    Home Living Family/patient expects to be discharged to:: Private residence Living Arrangements: Spouse/significant other Available Help at Discharge: Family;Available 24 hours/day Type of Home: House Home Access: Stairs to enter Entrance Stairs-Rails: None Entrance Stairs-Number of Steps: 4 Home Layout: Two level Home Equipment: Cane - single point Additional Comments: Wife recovered from COVID and works from home as Charity fundraiser    Prior Function Level of Independence: Independent         Comments: Works (physically demanding job), drives     Higher education careers adviser        Extremity/Trunk Assessment   Upper Extremity Assessment Upper Extremity Assessment: Overall WFL for tasks assessed    Lower Extremity Assessment Lower Extremity Assessment: Overall WFL for tasks assessed    Cervical / Trunk Assessment Cervical / Trunk Assessment: Normal  Communication   Communication: No difficulties  Cognition Arousal/Alertness: Awake/alert Behavior During Therapy: WFL for tasks assessed/performed Overall Cognitive Status: Within Functional Limits for tasks assessed                                 General Comments:  Some anxiety regarding O2 needs and SOB, but ultimately appears WFL; not formally assessed      General Comments General comments (skin integrity, edema, etc.): Educ on activity recommendations, pursed lip breathing    Exercises Other Exercises Other Exercises: Steps forwards backwards, repeated sit<>stands, marching in place, partial squats with UE support on windowsill/table - educ on 30-90sec bouts of activity with prolonged seated rest depending on pt's SOB/fatigue    Assessment/Plan    PT Assessment Patient needs continued PT services  PT Problem List Decreased activity tolerance;Decreased mobility;Cardiopulmonary status limiting activity       PT Treatment Interventions Gait training;Stair training;Functional mobility training;Therapeutic activities;Therapeutic exercise;Patient/family education    PT Goals (Current goals can be found in the Care Plan section)  Acute Rehab PT Goals Patient Stated Goal: Be able to breathe well PT Goal Formulation: With patient Time For Goal Achievement: 09/21/19 Potential to Achieve Goals: Good    Frequency Min 3X/week   Barriers to discharge        Co-evaluation               AM-PAC PT "6 Clicks" Mobility  Outcome Measure Help needed turning from your back to your side while in a flat bed without using bedrails?: None Help needed moving from lying on your back to sitting on the side of a flat bed without using bedrails?: None Help needed moving to and from a bed to a chair (including a wheelchair)?: None Help needed standing up from a chair using your arms (e.g., wheelchair or bedside chair)?: None Help needed to walk in hospital room?: None Help needed climbing 3-5 steps with a railing? : A Little 6 Click Score: 23    End of Session Equipment Utilized During Treatment: Oxygen Activity Tolerance: Patient tolerated treatment well Patient left: in chair;with call bell/phone within reach;with nursing/sitter in room Nurse Communication: Mobility status (session limited by RN wanting to get vitals while pt rested, pass meds and decrease O2 needs) PT Visit Diagnosis: Other abnormalities of gait and mobility (R26.89)    Time: 6720-9470 PT Time Calculation (min) (ACUTE ONLY): 13 min   Charges:   PT Evaluation $PT Eval Moderate Complexity: 1 Mod     Ina Homes, PT, DPT Acute Rehabilitation Services  Pager [phone] Office [phone]  Malachy Chamber 09/07/2019, 11:19 AM

## 2019-09-07 NOTE — Progress Notes (Signed)
PROGRESS NOTE                                                                                                                                                                                                             Patient Demographics:    Samuel [name], is a 54 y.o. male, DOB - April 03, 1965, ZOX:[id number]  Outpatient Primary MD for the patient is Patient, [name]    LOS - 13  Admit date - 08/24/2019    Chief Complaint  Patient presents with  . Cough       Brief Narrative Ry Moody is a 54 y.o. male with no significant past medical history presents to the ER at bedside at [facility] with complaint of shortness of breath.  Patient started having upper respiratory tract-like symptoms and headache about a week ago and was diagnosed with COVID-19 infection.  He continued to get progressively short of breath and presented to the ER with severe hypoxia requiring 12 L of oxygen and was admitted to the hospital subsequently.   Subjective:   Patient sitting in recliner, he appears comfortable, in the recliner, report he was quite dyspneic this morning with activity .   Assessment  & Plan :     Acute Hypoxic Resp. Failure due to Acute Covid 19 Viral Pneumonitis during the ongoing 2020 Covid 19 Pandemic - - He has severe disease and is unfortunately unvaccinated. -Treated with IV steroids, he is on Solu-Medrol given severe disease.,  Being tapered, last dose yesterday. -Treated with IV remdesivir. -He received Actemra on admission. - Echocardiogram was non acute & - ve CTA of the chest.  -Remains with significant oxygen requirement, able to wean to high flow nasal cannula, he is on 15 L currently . -On Lasix as needed, no evidence of volume overload today, no indication for Lasix, he did receive 20 of p.o. Lasix yesterday -Inflammatory marker has normalized, CRP less than 0.05, D-dimer is 0.5,.  SpO2: 94  % O2 Flow Rate (L/min): 15 L/min FiO2 (%): 40 %    Recent Labs  Lab 09/01/19 0554 09/01/19 0554 09/02/19 0245 09/03/19 0402 09/04/19 0830 09/05/19 0930 09/06/19 0332  WBC 16.7*   < > 13.5* 13.6* 17.6* 16.7* 16.1*  CRP 0.6   < > 1.2* 4.0* 1.1* 0.6 <0.5  DDIMER 0.50   < >  0.64* 0.82* 0.85* 0.49 0.50  BNP 36.5   < > 25.6 32.8 70.7 135.5* 100.1*  PROCALCITON <0.10  --   --   --   --   --   --   AST 25   < > 22 22 22 27 28   ALT 102*   < > 69* 59* 56* 62* 76*  ALKPHOS 69   < > 65 67 68 66 63  BILITOT 1.1   < > 1.1 0.6 0.7 0.8 0.6  ALBUMIN 3.2*   < > 2.9* 3.0* 3.3* 3.1* 3.2*   < > = values in this interval not displayed.     Productive cough developed 08/31/2019.   - Question mild superimposed bacterial bronchitis or pneumonia.  Procalcitonin stable, treated with levofloxacin .  Rising LFTs.   -Due to Covid pneumonia, remdesivir and Actemra, it is with mild fluctuation, but overall is stable, will check intermittently . GERD - PPI  Severe anxiety.  BID Klonopin. Started on  Lexapro as well.  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  Right atrium and right ventricular pressures stable, discussed with cardiologist Dr. Jones Broom.  Outpatient follow-up.     Condition - Guarded ++  Family Communication  :  D/W wife by phone today.  Code Status :  Full  Consults  :  None  Procedures  :    TTE  -  1. Left ventricular ejection fraction, by estimation, is 60 to 65%. The left ventricle has normal function. The left ventricle has no regional wall motion abnormalities. There is mild left ventricular hypertrophy. Left ventricular diastolic parameters were normal.  2. Right ventricular systolic function is normal. The right ventricular size is normal.  3. The mitral valve is normal in structure. No evidence of mitral valve regurgitation. No evidence of mitral stenosis.  4. The aortic valve is  normal in structure. Aortic valve regurgitation is not visualized. No aortic stenosis is present.  5. The inferior vena cava is normal in size with greater than 50% respiratory variability, suggesting right atrial pressure of 3 mmHg.    CTA  -  No PE  RUQ [place] - Non acute   PUD Prophylaxis : PPI  Disposition Plan  :    Status is: Inpatient  Remains inpatient appropriate because:IV treatments appropriate due to intensity of illness or inability to take PO   Dispo: The patient is from: Home              Anticipated d/c is to: Home              Anticipated d/c date is: > 3 days              Patient currently is not medically stable to d/c.   DVT Prophylaxis  :  Lovenox   Lab Results  Component Value Date   PLT 369 09/06/2019    Diet :  Diet Order            Diet regular Room service appropriate? Yes; Fluid consistency: Thin  Diet effective now                  Inpatient Medications  Scheduled Meds:  . (feeding supplement) PROSource Plus  30 mL Oral TID BM  . clonazePAM  1 mg Oral BID  . enoxaparin (LOVENOX) injection  40 mg Subcutaneous Daily  . escitalopram  5 mg Oral Daily  . Ipratropium-Albuterol  1 puff Inhalation TID  . multivitamin with minerals  1 tablet Oral Daily  . pantoprazole  40 mg Oral Daily  . senna-docusate  2 tablet Oral BID   Continuous Infusions:  PRN Meds:.acetaminophen, dextromethorphan-guaiFENesin, [DISCONTINUED] ondansetron **OR** ondansetron (ZOFRAN) IV, sodium chloride  Antibiotics  :    Anti-infectives (From admission, onward)   Start     Dose/Rate Route Frequency Ordered Stop   08/31/19 1100  levofloxacin (LEVAQUIN) tablet 750 mg        750 mg Oral Daily 08/31/19 1021 09/04/19 0939   08/25/19 1000  remdesivir 100 mg in sodium chloride 0.9 % 100 mL IVPB        100 mg 200 mL/hr over 30 Minutes Intravenous Daily 08/24/19 0357 08/28/19 0956   08/24/19 0400  remdesivir 100 mg in sodium chloride 0.9 % 100 mL IVPB        100 mg 200  mL/hr over 30 Minutes Intravenous Every 30 min 08/24/19 0357 08/24/19 1610      Mliss Fritz Norbert Malkin M.D on 09/07/2019 at 4:17 PM  To page go to [email or web address]   [facility] -  Office  [phone]   See all Orders from today for further details    Objective:   Vitals:   09/07/19 0500 09/07/19 0758 09/07/19 0936 09/07/19 1100  BP: 99/76  100/76   Pulse: 76 78 94   Resp: 19 (!) 26 20   Temp: 98.2 F (36.8 C)  98 F (36.7 C)   TempSrc: Oral  Oral   SpO2: 93% (!) 88% 94% 94%  Weight:      Height:        Wt Readings from Last 3 Encounters:  08/25/19 92.1 kg     Intake/Output Summary (Last 24 hours) at 09/07/2019 1617 Last data filed at 09/07/2019 1230 Gross per 24 hour  Intake 780 ml  Output 400 ml  Net 380 ml     Physical Exam  Awake Alert, Oriented X 3,sitting in recliner,  No new F.N deficits, Normal affect Symmetrical Chest wall movement, Good air movement bilaterally, CTAB RRR,No Gallops,Rubs or new Murmurs, No Parasternal Heave +ve B.Sounds, Abd Soft, No tenderness, No rebound - guarding or rigidity. No Cyanosis, Clubbing or edema, No new Rash or bruise      Data Review:    CBC Recent Labs  Lab 09/02/19 0245 09/03/19 0402 09/04/19 0830 09/05/19 0930 09/06/19 0332  WBC 13.5* 13.6* 17.6* 16.7* 16.1*  HGB 15.7 15.6 15.6 16.0 15.4  HCT 44.8 44.6 45.0 46.0 44.9  PLT 324 381 406* 352 369  MCV 90.1 89.7 90.0 92.2 91.6  MCH 31.6 31.4 31.2 32.1 31.4  MCHC 35.0 35.0 34.7 34.8 34.3  RDW 11.8 11.7 11.6 11.8 11.9  LYMPHSABS 0.6* 0.6* 0.7 0.9 0.8  MONOABS 0.4 0.4 0.4 0.5 0.5  EOSABS 0.6* 0.2 0.0 0.0 0.0  BASOSABS 0.0 0.0 0.0 0.1 0.0    Chemistries  Recent Labs  Lab 09/02/19 0245 09/03/19 0402 09/04/19 0830 09/05/19 0930 09/06/19 0332  NA 128* 133* 132* 134* 135  K 3.3* 3.8 3.9 3.8 4.4  CL 93* 95* 94* 96* 94*  CO2 25 26 27  18* 29  GLUCOSE 102* 120* 140* 152* 127*  BUN 20 22* 25* 26* 24*  CREATININE 1.02 1.08 1.07 1.11 1.21  CALCIUM 8.4*  8.8* 9.1 8.9 8.9  AST 22 22 22 27 28   ALT 69* 59* 56* 62* 76*  ALKPHOS 65 67 68 66 63  BILITOT 1.1 0.6 0.7 0.8 0.6  MG 2.3 2.4 2.4 2.3 2.6*     ------------------------------------------------------------------------------------------------------------------ No results for input(s): CHOL, HDL, LDLCALC, TRIG, CHOLHDL, LDLDIRECT in the last 72 hours.  No results found for: HGBA1C ------------------------------------------------------------------------------------------------------------------ No results for input(s): TSH, T4TOTAL, T3FREE, THYROIDAB in the last 72 hours.  Invalid input(s): FREET3  Cardiac Enzymes No results for input(s): CKMB, TROPONINI, MYOGLOBIN in the last 168 hours.  Invalid input(s): CK ------------------------------------------------------------------------------------------------------------------    Component Value Date/Time   BNP 100.1 (H) 09/06/2019 [id number]    Micro Results Recent Results (from the past 240 hour(s))  MRSA PCR Screening     Status: None   Collection Time: 09/02/19 10:48 PM   Specimen: Nasal Mucosa; Nasopharyngeal  Result Value Ref Range Status   MRSA by PCR NEGATIVE NEGATIVE Final    Comment:        The GeneXpert MRSA Assay (FDA approved for NASAL specimens only), is one component of a comprehensive MRSA colonization surveillance program. It is not intended to diagnose MRSA infection nor to guide or monitor treatment for MRSA infections. Performed at [facility], [address] [place], [place] [address]   Expectorated sputum assessment w rflx to resp cult     Status: None   Collection Time: 09/02/19 10:49 PM   Specimen: Sputum  Result Value Ref Range Status   Specimen Description SPUTUM  Final   Special Requests NONE  Final   Sputum evaluation   Final    THIS SPECIMEN IS ACCEPTABLE FOR SPUTUM CULTURE Performed at [facility], [address] [place], [place] [address]    Report Status 09/03/2019 FINAL  Final   Culture, respiratory     Status: None   Collection Time: 09/02/19 10:49 PM   Specimen: SPU  Result Value Ref Range Status   Specimen Description SPUTUM  Final   Special Requests NONE Reflexed from [id number]  Final   Gram Stain   Final    ABUNDANT WBC PRESENT,BOTH PMN AND MONONUCLEAR MODERATE GRAM POSITIVE COCCI RARE GRAM VARIABLE ROD    Culture   Final    FEW Normal respiratory flora-no Staph aureus or Pseudomonas seen Performed at [facility], [address] [place], [place] [address]    Report Status 09/06/2019 FINAL  Final    Radiology Reports CT ANGIO CHEST PE W OR WO CONTRAST  Result Date: 08/27/2019 CLINICAL DATA:  COVID-19 infection, acute respiratory disease, concern for pulmonary embolus, short of breath EXAM: CT ANGIOGRAPHY CHEST WITH CONTRAST TECHNIQUE: Multidetector CT imaging of the chest was performed using the standard protocol during bolus administration of intravenous contrast. Multiplanar CT image reconstructions and MIPs were obtained to evaluate the vascular anatomy. CONTRAST:  80mL OMNIPAQUE IOHEXOL 350 MG/ML SOLN COMPARISON:  08/27/2019 FINDINGS: Cardiovascular: This is a technically adequate evaluation of the pulmonary vasculature. No filling defects or pulmonary emboli. The heart is unremarkable without pericardial effusion. Normal caliber of the thoracic aorta. Minimal atherosclerosis of the coronary vasculature. Incidental note is made partial anomalous pulmonary venous return with the left upper lobe pulmonary vein draining into the left brachiocephalic vein. Mediastinum/Nodes: No enlarged mediastinal, hilar, or axillary lymph nodes. Thyroid gland, trachea, and esophagus demonstrate no significant findings. There is a small hiatal hernia. Lungs/Pleura: Multifocal bilateral ground-glass airspace disease is identified, greatest in the mid and lower lung zones, consistent with multifocal pneumonia and history of COVID-19. No effusion or pneumothorax. Central  airways are patent. Upper Abdomen: No acute abnormality. Musculoskeletal: No acute or destructive bony lesions. Reconstructed images demonstrate  no additional findings. Review of the MIP images confirms the above findings. IMPRESSION: 1. No evidence of pulmonary embolus. 2. Multifocal bilateral ground-glass airspace disease, greatest in the mid and lower lung zones, consistent with multifocal pneumonia and history of COVID-19. 3. 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 4. Small hiatal hernia. Electronically Signed   By: Sharlet Salina M.D.   On: 08/27/2019 15:52   DG Chest Port 1 View  Result Date: 09/03/2019 CLINICAL DATA:  Decreased oxygen saturation EXAM: PORTABLE CHEST 1 VIEW COMPARISON:  September 02, 2019 FINDINGS: Airspace opacity seen throughout both mid and lower lung regions, similar to 1 day prior. Heart size is upper normal with pulmonary vascularity normal. No adenopathy. No bone lesions. IMPRESSION: Stable airspace opacity throughout the mid and lower lung regions. Suspect atypical organism pneumonia. Appearance similar to 1 day prior. Stable cardiac silhouette. Electronically Signed   By: Bretta Bang III M.D.   On: 09/03/2019 09:21   DG Chest Port 1 View  Result Date: 09/02/2019 CLINICAL DATA:  COVID-19.  Shortness of breath. EXAM: PORTABLE CHEST 1 VIEW COMPARISON:  August 31, 2019 FINDINGS: Increasing bilateral pulmonary infiltrates.  No other changes. IMPRESSION: Increasing bilateral pulmonary infiltrates consistent with worsening COVID-19 pneumonia. No other change. Electronically Signed   By: Gerome Sam III M.D   On: 09/02/2019 10:22   DG CHEST PORT 1 VIEW  Result Date: 08/31/2019 CLINICAL DATA:  Shortness of breath.  COVID. EXAM: PORTABLE CHEST 1 VIEW COMPARISON:  08/30/2019. FINDINGS: Mediastinum hilar structures normal. Heart size stable. Low lung volumes with persistent bibasilar  pulmonary infiltrates. Similar findings on prior exam. No pleural effusion or pneumothorax. IMPRESSION: Low lung volumes with persistent bibasilar infiltrates in this known COVID positive patient. Similar findings noted on prior exam. Electronically Signed   By: Maisie Fus  Register   On: 08/31/2019 07:16   DG Chest Port 1 View  Result Date: 08/30/2019 CLINICAL DATA:  COVID positive EXAM: PORTABLE CHEST 1 VIEW COMPARISON:  August 27, 2019 FINDINGS: Cardiomediastinal contours are partially obscured by increasing basilar opacities since the prior study. Lung volumes are diminished compared to the previous exam. On limited assessment no acute skeletal process. IMPRESSION: 1. Worsening basilar airspace disease and slight decrease in lung volumes in the setting of COVID-19 pneumonia. Electronically Signed   By: Donzetta Kohut M.D.   On: 08/30/2019 08:17   DG Chest Port 1 View  Result Date: 08/27/2019 CLINICAL DATA:  Shortness of breath EXAM: PORTABLE CHEST 1 VIEW COMPARISON:  08/24/2019 chest radiograph. FINDINGS: Peripheral predominant streaky pulmonary opacities are unchanged. No pneumothorax or pleural effusion. Cardiomediastinal silhouette is unchanged. No acute osseous abnormality. IMPRESSION: Multifocal pneumonia, grossly unchanged. Electronically Signed   By: Stana Bunting M.D.   On: 08/27/2019 09:51   DG Chest Portable 1 View  Result Date: 08/24/2019 CLINICAL DATA:  Cough fever COVID positive EXAM: PORTABLE CHEST 1 VIEW COMPARISON:  None. FINDINGS: Streaky bilateral pulmonary opacities. Normal heart size. No pneumothorax or pleural effusion IMPRESSION: Streaky bilateral pulmonary opacities suspicious for bilateral pneumonia. Electronically Signed   By: Jasmine Pang M.D.   On: 08/24/2019 00:41   ECHOCARDIOGRAM COMPLETE  Result Date: 08/27/2019    ECHOCARDIOGRAM REPORT   Patient Name:   [place] Date of Exam: 08/27/2019 Medical Rec #:  [id number]                Height:       72.0 in  Accession #:    [id number]               Weight:       203.0 lb Date of Birth:  06-Sep-1965                BSA:          2.144 m Patient Age:    54 years                 BP:           121/80 mmHg Patient Gender: M                        HR:           74 bpm. Exam Location:  Inpatient Procedure: 2D Echo and Intracardiac Opacification Agent Indications:    CHF-Acute Diastolic 428.31 / I50.31  History:        Patient has no prior history of Echocardiogram examinations.                 Signs/Symptoms:Shortness of Breath. Covid 19. GERD.  Sonographer:    Leta Jungling RDCS Referring Phys: Heide Scales [facility]  Sonographer Comments: Suboptimal apical window and suboptimal subcostal window. IMPRESSIONS  1. Left ventricular ejection fraction, by estimation, is 60 to 65%. The left ventricle has normal function. The left ventricle has no regional wall motion abnormalities. There is mild left ventricular hypertrophy. Left ventricular diastolic parameters were normal.  2. Right ventricular systolic function is normal. The right ventricular size is normal.  3. The mitral valve is normal in structure. No evidence of mitral valve regurgitation. No evidence of mitral stenosis.  4. The aortic valve is normal in structure. Aortic valve regurgitation is not visualized. No aortic stenosis is present.  5. The inferior vena cava is normal in size with greater than 50% respiratory variability, suggesting right atrial pressure of 3 mmHg. FINDINGS  Left Ventricle: Left ventricular ejection fraction, by estimation, is 60 to 65%. The left ventricle has normal function. The left ventricle has no regional wall motion abnormalities. Definity contrast agent was given IV to delineate the left ventricular  endocardial borders. The left ventricular internal cavity size was normal in size. There is mild left ventricular hypertrophy. Left ventricular diastolic parameters were normal. Right Ventricle: The right ventricular size is normal. No increase  in right ventricular wall thickness. Right ventricular systolic function is normal. Left Atrium: Left atrial size was normal in size. Right Atrium: Right atrial size was normal in size. Pericardium: There is no evidence of pericardial effusion. Mitral Valve: The mitral valve is normal in structure. Normal mobility of the mitral valve leaflets. No evidence of mitral valve regurgitation. No evidence of mitral valve stenosis. Tricuspid Valve: The tricuspid valve is normal in structure. Tricuspid valve regurgitation is not demonstrated. No evidence of tricuspid stenosis. Aortic Valve: The aortic valve is normal in structure. Aortic valve regurgitation is not visualized. No aortic stenosis is present. Pulmonic Valve: The pulmonic valve was normal in structure. Pulmonic valve regurgitation is not visualized. No evidence of pulmonic stenosis. Aorta: The aortic root is normal in size and structure. Venous: The inferior vena cava is normal in size with greater than 50% respiratory variability, suggesting right atrial pressure of 3 mmHg. IAS/Shunts: The interatrial septum was not well visualized.  LEFT VENTRICLE PLAX 2D LVIDd:         4.20 cm  Diastology LVIDs:  3.00 cm  LV e' lateral:   4.90 cm/s LV PW:         0.70 cm  LV E/e' lateral: 14.1 LV IVS:        2.45 cm  LV e' medial:    4.57 cm/s LVOT diam:     2.30 cm  LV E/e' medial:  15.1 LV SV:         74 LV SV Index:   34 LVOT Area:     4.15 cm  LEFT ATRIUM         Index LA diam:    2.10 cm 0.98 cm/m  AORTIC VALVE LVOT Vmax:   57.20 cm/s LVOT Vmean:  59.100 cm/s LVOT VTI:    0.178 m  AORTA Ao Root diam: 3.70 cm MITRAL VALVE MV Area (PHT): 2.71 cm    SHUNTS MV Decel Time: 280 msec    Systemic VTI:  0.18 m MV E velocity: 69.20 cm/s  Systemic Diam: 2.30 cm MV A velocity: 65.55 cm/s MV E/A ratio:  1.06 Charlton Haws MD Electronically signed by Charlton Haws MD Signature Date/Time: 08/27/2019/10:56:13 AM    Final    US Abdomen Limited RUQ  Result Date:  08/27/2019 CLINICAL DATA:  Elevated liver enzymes.  Inpatient. EXAM: ULTRASOUND ABDOMEN LIMITED RIGHT UPPER QUADRANT COMPARISON:  None. FINDINGS: Unable to turn patient into decubitus position due to restraints, limiting assessment. Gallbladder: No gallstones or wall thickening visualized. No sonographic Murphy sign noted by sonographer. Common bile duct: CBD not discretely visualized. No evidence of intrahepatic biliary ductal dilatation. Liver: Liver parenchyma is top-normal in echogenicity. No liver surface irregularity. No liver masses. Portal vein is patent on color Doppler imaging with normal direction of blood flow towards the liver. Other: None. IMPRESSION: Limited scan, see comments. No acute abnormality. No cholelithiasis. Electronically Signed   By: Delbert Phenix M.D.   On: 08/27/2019 18:31

## 2019-09-07 NOTE — Progress Notes (Signed)
   09/07/19 0930 09/07/19 0936  Assess: MEWS Score  Temp  --  98 F (36.7 C)  BP  --  100/76  Pulse Rate  --  94  Resp  --  20  Level of Consciousness Alert  --   SpO2  --  94 %  O2 Device  --   (heated high flow nasal cannula)  O2 Flow Rate (L/min)  --  20 L/min  Assess: MEWS Score  MEWS Temp 0 0  MEWS Systolic 1 1  MEWS Pulse 0 0  MEWS RR 2 0  MEWS LOC 0 0  MEWS Score 3 1  MEWS Score Color Yellow Green  Assess: if the MEWS score is Yellow or Red  Focused Assessment  --  No change from prior assessment  MEWS guidelines implemented *See Row Information*  --  No, vital signs rechecked  Treat  Pain Scale  --  0-10  Pain Score  --  0  Notify: Provider  Provider Name/Title  --  ELgergawy  Date Provider Notified  --  09/07/19  Time Provider Notified  --  [phone]  Notification Type  --  Face-to-face  Notification Reason  --  Other (Comment)  Response  --  See new orders  Document  Patient Outcome  --  Other (Comment) (No change in condition)   Patient seen by MD shortly after vitals rechecked. Patient alert and oriented, participating in physical therapy.

## 2019-09-07 NOTE — Plan of Care (Signed)
  Problem: Education: Goal: Knowledge of General Education information will improve Description: Including pain rating scale, medication(s)/side effects and non-pharmacologic comfort measures Outcome: Progressing   Problem: Activity: Goal: Risk for activity intolerance will decrease Outcome: Progressing   Problem: Respiratory: Goal: Will maintain a patent airway Outcome: Progressing  Patient O2 set up changed to 15L HFNC.  Patient 94% at rest.

## 2019-09-08 LAB — CBC
HCT: 42.6 % (ref 39.0–52.0)
Hemoglobin: 14.5 g/dL (ref 13.0–17.0)
MCH: 31.3 pg (ref 26.0–34.0)
MCHC: 34 g/dL (ref 30.0–36.0)
MCV: 91.8 fL (ref 80.0–100.0)
Platelets: 294 10*3/uL (ref 150–400)
RBC: 4.64 MIL/uL (ref 4.22–5.81)
RDW: 11.8 % (ref 11.5–15.5)
WBC: 14 10*3/uL — ABNORMAL HIGH (ref 4.0–10.5)
nRBC: 0 % (ref 0.0–0.2)

## 2019-09-08 LAB — COMPREHENSIVE METABOLIC PANEL
ALT: 64 U/L — ABNORMAL HIGH (ref 0–44)
AST: 20 U/L (ref 15–41)
Albumin: 2.8 g/dL — ABNORMAL LOW (ref 3.5–5.0)
Alkaline Phosphatase: 52 U/L (ref 38–126)
Anion gap: 9 (ref 5–15)
BUN: 24 mg/dL — ABNORMAL HIGH (ref 6–20)
CO2: 27 mmol/L (ref 22–32)
Calcium: 8.5 mg/dL — ABNORMAL LOW (ref 8.9–10.3)
Chloride: 98 mmol/L (ref 98–111)
Creatinine, Ser: 0.95 mg/dL (ref 0.61–1.24)
GFR calc Af Amer: >60 mL/min (ref >60)
GFR calc non Af Amer: >60 mL/min (ref >60)
Glucose, Bld: 118 mg/dL — ABNORMAL HIGH (ref 70–99)
Potassium: 4 mmol/L (ref 3.5–5.1)
Sodium: 134 mmol/L — ABNORMAL LOW (ref 135–145)
Total Bilirubin: 0.6 mg/dL (ref 0.3–1.2)
Total Protein: 5.3 g/dL — ABNORMAL LOW (ref 6.5–8.1)

## 2019-09-08 NOTE — Progress Notes (Signed)
SATURATION QUALIFICATIONS: (This note is used to comply with regulatory documentation for home oxygen)  Patient Saturations on Room Air at Rest = N/A  Patient Saturations on Room Air while Ambulating = N/A  Patient Saturations on 4 Liters of oxygen while Ambulating = 87%  Please briefly explain why patient needs home oxygen: Pt needs home O2 in order to maintain O2 saturations above 88%. Will reevaluate to see if 5L keeps him above 88%. While walking today he stayed mostly 88-90%. Only dropped briefly for a couple seconds

## 2019-09-08 NOTE — Progress Notes (Signed)
PROGRESS NOTE                                                                                                                                                                                                             Patient Demographics:    Samuel [name], is a 54 y.o. male, DOB - 1965-02-02, ZOX:[id number]  Outpatient Primary MD for the patient is Patient, [name]    LOS - 14  Admit date - 08/24/2019    Chief Complaint  Patient presents with  . Cough       Brief Narrative [name] is a 54 y.o. male with no significant past medical history presents to the ER at bedside at [facility] with complaint of shortness of breath.  Patient started having upper respiratory tract-like symptoms and headache about a week ago and was diagnosed with COVID-19 infection.  He continued to get progressively short of breath and presented to the ER with severe hypoxia requiring 12 L of oxygen and was admitted to the hospital subsequently.   Subjective:   Patient sitting in recliner, he appears comfortable, in the recliner, patient was weaned to 5 L today.   Assessment  & Plan :     Acute Hypoxic Resp. Failure due to Acute Covid 19 Viral Pneumonitis during the ongoing 2020 Covid 19 Pandemic - - He has severe disease and is unfortunately unvaccinated. -Treated with IV steroids, he is on Solu-Medrol given severe disease.,  Being tapered, last dose 8/27 -Treated with IV remdesivir. -He received Actemra on admission. - Echocardiogram was non acute & - ve CTA of the chest.  -Remains with significant oxygen requirement, able to wean to high flow nasal cannula, down to 5 L today, discussed with staff, they will try to ambulate. -On Lasix as needed, no evidence of volume overload today, no indication for Lasix today.. -Inflammatory marker has normalized, CRP less than 0.05, D-dimer is 0.5,.  SpO2: 95 % O2 Flow Rate (L/min): 5  L/min FiO2 (%): 40 %    Recent Labs  Lab 09/02/19 0245 09/02/19 0245 09/03/19 0402 09/04/19 0830 09/05/19 0930 09/06/19 0332 09/08/19 0121  WBC 13.5*   < > 13.6* 17.6* 16.7* 16.1* 14.0*  CRP 1.2*  --  4.0* 1.1* 0.6 <0.5  --   DDIMER 0.64*  --  0.82* 0.85* 0.49 0.50  --  BNP 25.6  --  32.8 70.7 135.5* 100.1*  --   AST 22   < > ALT 69*   < > 59* 56* 62* 76* 64*  ALKPHOS 65   < > 67 68 66 63 52  BILITOT 1.1   < > 0.6 0.7 0.8 0.6 0.6  ALBUMIN 2.9*   < > 3.0* 3.3* 3.1* 3.2* 2.8*   < > = values in this interval not displayed.     Productive cough developed 08/31/2019.   - Question mild superimposed bacterial bronchitis or pneumonia.  Procalcitonin stable, treated with levofloxacin .  Rising LFTs.   -Due to Covid pneumonia, remdesivir and Actemra, it is with mild fluctuation, but overall is stable, will check intermittently . GERD - PPI  Severe anxiety.  BID Klonopin. Started on  Lexapro as well.  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  Right atrium and right ventricular pressures stable, discussed with cardiologist Dr. Jones Broom.  Outpatient follow-up.     Condition - Guarded ++  Family Communication  : Left wife a voicemail today.  Code Status :  Full  Consults  :  None  Procedures  :    TTE  -  1. Left ventricular ejection fraction, by estimation, is 60 to 65%. The left ventricle has normal function. The left ventricle has no regional wall motion abnormalities. There is mild left ventricular hypertrophy. Left ventricular diastolic parameters were normal.  2. Right ventricular systolic function is normal. The right ventricular size is normal.  3. The mitral valve is normal in structure. No evidence of mitral valve regurgitation. No evidence of mitral stenosis.  4. The aortic valve is normal in structure. Aortic valve regurgitation is not visualized. No aortic  stenosis is present.  5. The inferior vena cava is normal in size with greater than 50% respiratory variability, suggesting right atrial pressure of 3 mmHg.    CTA  -  No PE  RUQ [place] - Non acute   PUD Prophylaxis : PPI  Disposition Plan  :    Status is: Inpatient  Remains inpatient appropriate because:IV treatments appropriate due to intensity of illness or inability to take PO   Dispo: The patient is from: Home              Anticipated d/c is to: Home              Anticipated d/c date is: > 3 days              Patient currently is not medically stable to d/c.   DVT Prophylaxis  :  Lovenox   Lab Results  Component Value Date   PLT 294 09/08/2019    Diet :  Diet Order            Diet regular Room service appropriate? Yes; Fluid consistency: Thin  Diet effective now                  Inpatient Medications  Scheduled Meds:  . (feeding supplement) PROSource Plus  30 mL Oral TID BM  . clonazePAM  1 mg Oral BID  . enoxaparin (LOVENOX) injection  40 mg Subcutaneous Daily  . escitalopram  5 mg Oral Daily  . Ipratropium-Albuterol  1 puff Inhalation TID  . multivitamin with minerals  1 tablet Oral Daily  . pantoprazole  40 mg Oral Daily  . senna-docusate  2 tablet Oral BID   Continuous Infusions:  PRN Meds:.acetaminophen, dextromethorphan-guaiFENesin, [DISCONTINUED] ondansetron **OR** ondansetron (ZOFRAN) IV, sodium chloride  Antibiotics  :    Anti-infectives (From admission, onward)   Start     Dose/Rate Route Frequency Ordered Stop   08/31/19 1100  levofloxacin (LEVAQUIN) tablet 750 mg        750 mg Oral Daily 08/31/19 1021 09/04/19 0939   08/25/19 1000  remdesivir 100 mg in sodium chloride 0.9 % 100 mL IVPB        100 mg 200 mL/hr over 30 Minutes Intravenous Daily 08/24/19 0357 08/28/19 0956   08/24/19 0400  remdesivir 100 mg in sodium chloride 0.9 % 100 mL IVPB        100 mg 200 mL/hr over 30 Minutes Intravenous Every 30 min 08/24/19 0357 08/24/19 0923       Mliss Fritz Charley Miske M.D on 09/08/2019 at 3:10 PM  To page go to [email or web address]   [facility] -  Office  [phone]   See all Orders from today for further details    Objective:   Vitals:   09/07/19 1600 09/07/19 1648 09/07/19 2137 09/08/19 0600  BP:  103/72 106/71 116/78  Pulse:  90 63 72  Resp:  18 20 16   Temp:  98 F (36.7 C) 98.3 F (36.8 C) 98.1 F (36.7 C)  TempSrc:  Oral Oral Oral  SpO2: 94% 100% 100% 95%  Weight:      Height:        Wt Readings from Last 3 Encounters:  08/25/19 92.1 kg     Intake/Output Summary (Last 24 hours) at 09/08/2019 1510 Last data filed at 09/08/2019 0653 Gross per 24 hour  Intake 240 ml  Output 600 ml  Net -360 ml     Physical Exam  Awake Alert, Oriented X 3,sitting in recliner,  No new F.N deficits, Normal affect Symmetrical Chest wall movement, Good air movement bilaterally, CTAB RRR,No Gallops,Rubs or new Murmurs, No Parasternal Heave +ve B.Sounds, Abd Soft, No tenderness, No rebound - guarding or rigidity. No Cyanosis, Clubbing or edema, No new Rash or bruise      Data Review:    CBC Recent Labs  Lab 09/02/19 0245 09/02/19 0245 09/03/19 0402 09/04/19 0830 09/05/19 0930 09/06/19 0332 09/08/19 0121  WBC 13.5*   < > 13.6* 17.6* 16.7* 16.1* 14.0*  HGB 15.7   < > 15.6 15.6 16.0 15.4 14.5  HCT 44.8   < > 44.6 45.0 46.0 44.9 42.6  PLT 324   < > 381 406* 352 369 294  MCV 90.1   < > 89.7 90.0 92.2 91.6 91.8  MCH 31.6   < > 31.4 31.2 32.1 31.4 31.3  MCHC 35.0   < > 35.0 34.7 34.8 34.3 34.0  RDW 11.8   < > 11.7 11.6 11.8 11.9 11.8  LYMPHSABS 0.6*  --  0.6* 0.7 0.9 0.8  --   MONOABS 0.4  --  0.4 0.4 0.5 0.5  --   EOSABS 0.6*  --  0.2 0.0 0.0 0.0  --   BASOSABS 0.0  --  0.0 0.0 0.1 0.0  --    < > = values in this interval not displayed.    Chemistries  Recent Labs  Lab 09/02/19 0245 09/02/19 0245 09/03/19 0402 09/04/19 0830 09/05/19 0930 09/06/19 0332 09/08/19 0121  NA 128*   < > 133* 132* 134* 135  134*  K 3.3*   < > 3.8 3.9 3.8 4.4 4.0  CL  93*   < > 95* 94* 96* 94* 98  CO2 25   < > 26 27 18* 29 27  GLUCOSE 102*   < > 120* 140* 152* 127* 118*  BUN 20   < > 22* 25* 26* 24* 24*  CREATININE 1.02   < > 1.08 1.07 1.11 1.21 0.95  CALCIUM 8.4*   < > 8.8* 9.1 8.9 8.9 8.5*  AST 22   < > ALT 69*   < > 59* 56* 62* 76* 64*  ALKPHOS 65   < > 67 68 66 63 52  BILITOT 1.1   < > 0.6 0.7 0.8 0.6 0.6  MG 2.3  --  2.4 2.4 2.3 2.6*  --    < > = values in this interval not displayed.     ------------------------------------------------------------------------------------------------------------------ No results for input(s): CHOL, HDL, LDLCALC, TRIG, CHOLHDL, LDLDIRECT in the last 72 hours.  No results found for: HGBA1C ------------------------------------------------------------------------------------------------------------------ No results for input(s): TSH, T4TOTAL, T3FREE, THYROIDAB in the last 72 hours.  Invalid input(s): FREET3  Cardiac Enzymes No results for input(s): CKMB, TROPONINI, MYOGLOBIN in the last 168 hours.  Invalid input(s): CK ------------------------------------------------------------------------------------------------------------------    Component Value Date/Time   BNP 100.1 (H) 09/06/2019 1610    Micro Results Recent Results (from the past 240 hour(s))  MRSA PCR Screening     Status: None   Collection Time: 09/02/19 10:48 PM   Specimen: Nasal Mucosa; Nasopharyngeal  Result Value Ref Range Status   MRSA by PCR NEGATIVE NEGATIVE Final    Comment:        The GeneXpert MRSA Assay (FDA approved for NASAL specimens only), is one component of a comprehensive MRSA colonization surveillance program. It is not intended to diagnose MRSA infection nor to guide or monitor treatment for MRSA infections. Performed at [facility], [address], [place], [place] [address]   Expectorated sputum assessment w rflx to resp cult     Status: None    Collection Time: 09/02/19 10:49 PM   Specimen: Sputum  Result Value Ref Range Status   Specimen Description SPUTUM  Final   Special Requests NONE  Final   Sputum evaluation   Final    THIS SPECIMEN IS ACCEPTABLE FOR SPUTUM CULTURE Performed at [facility], [address] [place], [place] [address]    Report Status 09/03/2019 FINAL  Final  Culture, respiratory     Status: None   Collection Time: 09/02/19 10:49 PM   Specimen: SPU  Result Value Ref Range Status   Specimen Description SPUTUM  Final   Special Requests NONE Reflexed from [id number]  Final   Gram Stain   Final    ABUNDANT WBC PRESENT,BOTH PMN AND MONONUCLEAR MODERATE GRAM POSITIVE COCCI RARE GRAM VARIABLE ROD    Culture   Final    FEW Normal respiratory flora-no Staph aureus or Pseudomonas seen Performed at [facility], [address], [place], [place] [address]    Report Status 09/06/2019 FINAL  Final    Radiology Reports CT ANGIO CHEST PE W OR WO CONTRAST  Result Date: 08/27/2019 CLINICAL DATA:  COVID-19 infection, acute respiratory disease, concern for pulmonary embolus, short of breath EXAM: CT ANGIOGRAPHY CHEST WITH CONTRAST TECHNIQUE: Multidetector CT imaging of the chest was performed using the standard protocol during bolus administration of intravenous contrast. Multiplanar CT image reconstructions and MIPs were obtained to evaluate the vascular anatomy. CONTRAST:  80mL OMNIPAQUE IOHEXOL 350  MG/ML SOLN COMPARISON:  08/27/2019 FINDINGS: Cardiovascular: This is a technically adequate evaluation of the pulmonary vasculature. No filling defects or pulmonary emboli. The heart is unremarkable without pericardial effusion. Normal caliber of the thoracic aorta. Minimal atherosclerosis of the coronary vasculature. Incidental note is made partial anomalous pulmonary venous return with the left upper lobe pulmonary vein draining into the left brachiocephalic vein. Mediastinum/Nodes: No enlarged mediastinal, hilar, or  axillary lymph nodes. Thyroid gland, trachea, and esophagus demonstrate no significant findings. There is a small hiatal hernia. Lungs/Pleura: Multifocal bilateral ground-glass airspace disease is identified, greatest in the mid and lower lung zones, consistent with multifocal pneumonia and history of COVID-19. No effusion or pneumothorax. Central airways are patent. Upper Abdomen: No acute abnormality. Musculoskeletal: No acute or destructive bony lesions. Reconstructed images demonstrate no additional findings. Review of the MIP images confirms the above findings. IMPRESSION: 1. No evidence of pulmonary embolus. 2. Multifocal bilateral ground-glass airspace disease, greatest in the mid and lower lung zones, consistent with multifocal pneumonia and history of COVID-19. 3. 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 4. Small hiatal hernia. Electronically Signed   By: Sharlet Salina M.D.   On: 08/27/2019 15:52   DG Chest Port 1 View  Result Date: 09/03/2019 CLINICAL DATA:  Decreased oxygen saturation EXAM: PORTABLE CHEST 1 VIEW COMPARISON:  September 02, 2019 FINDINGS: Airspace opacity seen throughout both mid and lower lung regions, similar to 1 day prior. Heart size is upper normal with pulmonary vascularity normal. No adenopathy. No bone lesions. IMPRESSION: Stable airspace opacity throughout the mid and lower lung regions. Suspect atypical organism pneumonia. Appearance similar to 1 day prior. Stable cardiac silhouette. Electronically Signed   By: Bretta Bang III M.D.   On: 09/03/2019 09:21   DG Chest Port 1 View  Result Date: 09/02/2019 CLINICAL DATA:  COVID-19.  Shortness of breath. EXAM: PORTABLE CHEST 1 VIEW COMPARISON:  August 31, 2019 FINDINGS: Increasing bilateral pulmonary infiltrates.  No other changes. IMPRESSION: Increasing bilateral pulmonary infiltrates consistent with worsening COVID-19 pneumonia.  No other change. Electronically Signed   By: Gerome Sam III M.D   On: 09/02/2019 10:22   DG CHEST PORT 1 VIEW  Result Date: 08/31/2019 CLINICAL DATA:  Shortness of breath.  COVID. EXAM: PORTABLE CHEST 1 VIEW COMPARISON:  08/30/2019. FINDINGS: Mediastinum hilar structures normal. Heart size stable. Low lung volumes with persistent bibasilar pulmonary infiltrates. Similar findings on prior exam. No pleural effusion or pneumothorax. IMPRESSION: Low lung volumes with persistent bibasilar infiltrates in this known COVID positive patient. Similar findings noted on prior exam. Electronically Signed   By: Maisie Fus  Register   On: 08/31/2019 07:16   DG Chest Port 1 View  Result Date: 08/30/2019 CLINICAL DATA:  COVID positive EXAM: PORTABLE CHEST 1 VIEW COMPARISON:  August 27, 2019 FINDINGS: Cardiomediastinal contours are partially obscured by increasing basilar opacities since the prior study. Lung volumes are diminished compared to the previous exam. On limited assessment no acute skeletal process. IMPRESSION: 1. Worsening basilar airspace disease and slight decrease in lung volumes in the setting of COVID-19 pneumonia. Electronically Signed   By: Donzetta Kohut M.D.   On: 08/30/2019 08:17   DG Chest Port 1 View  Result Date: 08/27/2019 CLINICAL DATA:  Shortness of breath EXAM: PORTABLE CHEST 1 VIEW COMPARISON:  08/24/2019 chest radiograph. FINDINGS: Peripheral predominant streaky pulmonary opacities are unchanged. No pneumothorax or pleural effusion. Cardiomediastinal silhouette is unchanged. No acute osseous abnormality. IMPRESSION:  Multifocal pneumonia, grossly unchanged. Electronically Signed   By: Stana Buntinghikanele  Emekauwa M.D.   On: 08/27/2019 09:51   DG Chest Portable 1 View  Result Date: 08/24/2019 CLINICAL DATA:  Cough fever COVID positive EXAM: PORTABLE CHEST 1 VIEW COMPARISON:  None. FINDINGS: Streaky bilateral pulmonary opacities. Normal heart size. No pneumothorax or pleural effusion IMPRESSION:  Streaky bilateral pulmonary opacities suspicious for bilateral pneumonia. Electronically Signed   By: Jasmine PangKim  Fujinaga M.D.   On: 08/24/2019 00:41   ECHOCARDIOGRAM COMPLETE  Result Date: 08/27/2019    ECHOCARDIOGRAM REPORT   [facility] Date of Exam: 08/27/2019 Medical Rec #:  [id number]                Height:       72.0 in Accession #:    [id number]               Weight:       203.0 lb Date of Birth:  [birth year]                BSA:          2.144 m Patient Age:    54 years                 BP:           121/80 mmHg Patient Gender: M                        HR:           74 bpm. Exam Location:  Inpatient Procedure: 2D Echo and Intracardiac Opacification Agent Indications:    CHF-Acute Diastolic 428.31 / I50.31  History:        Patient has no prior history of Echocardiogram examinations.                 Signs/Symptoms:Shortness of Breath. Covid 19. GERD.  Sonographer:    Leta Junglingiffany Cooper RDCS Referring Phys: Heide [id number] PRASHANT K [facility]  Sonographer Comments: Suboptimal apical window and suboptimal subcostal window. IMPRESSIONS  1. Left ventricular ejection fraction, by estimation, is 60 to 65%. The left ventricle has normal function. The left ventricle has no regional wall motion abnormalities. There is mild left ventricular hypertrophy. Left ventricular diastolic parameters were normal.  2. Right ventricular systolic function is normal. The right ventricular size is normal.  3. The mitral valve is normal in structure. No evidence of mitral valve regurgitation. No evidence of mitral stenosis.  4. The aortic valve is normal in structure. Aortic valve regurgitation is not visualized. No aortic stenosis is present.  5. The inferior vena cava is normal in size with greater than 50% respiratory variability, suggesting right atrial pressure of 3 mmHg. FINDINGS  Left Ventricle: Left ventricular ejection fraction, by estimation, is 60 to 65%. The left ventricle has normal function. The left ventricle has no  regional wall motion abnormalities. Definity contrast agent was given IV to delineate the left ventricular  endocardial borders. The left ventricular internal cavity size was normal in size. There is mild left ventricular hypertrophy. Left ventricular diastolic parameters were normal. Right Ventricle: The right ventricular size is normal. No increase in right ventricular wall thickness. Right ventricular systolic function is normal. Left Atrium: Left atrial size was normal in size. Right Atrium: Right atrial size was normal in size. Pericardium: There is no evidence of pericardial effusion. Mitral Valve: The mitral valve is normal in structure. Normal mobility of the mitral valve  leaflets. No evidence of mitral valve regurgitation. No evidence of mitral valve stenosis. Tricuspid Valve: The tricuspid valve is normal in structure. Tricuspid valve regurgitation is not demonstrated. No evidence of tricuspid stenosis. Aortic Valve: The aortic valve is normal in structure. Aortic valve regurgitation is not visualized. No aortic stenosis is present. Pulmonic Valve: The pulmonic valve was normal in structure. Pulmonic valve regurgitation is not visualized. No evidence of pulmonic stenosis. Aorta: The aortic root is normal in size and structure. Venous: The inferior vena cava is normal in size with greater than 50% respiratory variability, suggesting right atrial pressure of 3 mmHg. IAS/Shunts: The interatrial septum was not well visualized.  LEFT VENTRICLE PLAX 2D LVIDd:         4.20 cm  Diastology LVIDs:         3.00 cm  LV e' lateral:   4.90 cm/s LV PW:         0.70 cm  LV E/e' lateral: 14.1 LV IVS:        2.45 cm  LV e' medial:    4.57 cm/s LVOT diam:     2.30 cm  LV E/e' medial:  15.1 LV SV:         74 LV SV Index:   34 LVOT Area:     4.15 cm  LEFT ATRIUM         Index LA diam:    2.10 cm 0.98 cm/m  AORTIC VALVE LVOT Vmax:   57.20 cm/s LVOT Vmean:  59.100 cm/s LVOT VTI:    0.178 m  AORTA Ao Root diam: 3.70 cm MITRAL  VALVE MV Area (PHT): 2.71 cm    SHUNTS MV Decel Time: 280 msec    Systemic VTI:  0.18 m MV E velocity: 69.20 cm/s  Systemic Diam: 2.30 cm MV A velocity: 65.55 cm/s MV E/A ratio:  1.06 Charlton Haws MD Electronically signed by Charlton Haws MD Signature Date/Time: 08/27/2019/10:56:13 AM    Final    US Abdomen Limited RUQ  Result Date: 08/27/2019 CLINICAL DATA:  Elevated liver enzymes.  Inpatient. EXAM: ULTRASOUND ABDOMEN LIMITED RIGHT UPPER QUADRANT COMPARISON:  None. FINDINGS: Unable to turn patient into decubitus position due to restraints, limiting assessment. Gallbladder: No gallstones or wall thickening visualized. No sonographic Murphy sign noted by sonographer. Common bile duct: CBD not discretely visualized. No evidence of intrahepatic biliary ductal dilatation. Liver: Liver parenchyma is top-normal in echogenicity. No liver surface irregularity. No liver masses. Portal vein is patent on color Doppler imaging with normal direction of blood flow towards the liver. Other: None. IMPRESSION: Limited scan, see comments. No acute abnormality. No cholelithiasis. Electronically Signed   By: Delbert Phenix M.D.   On: 08/27/2019 18:31

## 2019-09-08 NOTE — Progress Notes (Signed)
Patient's 02 flow rate down to 5 Lit from 12 Lit. Patient is tolerating with this rate.

## 2019-09-08 NOTE — Progress Notes (Signed)
Occupational Therapy Evaluation Patient Details Name: Samuel [name] MRN: [id number] DOB: [birth year] Today's Date: 09/08/2019    History of Present Illness Pt is a 54 y.o. male admitted 08/24/19 with SOB with recent (+) COVID-19 dx (pt unvaccinated). Workup for acute hypoxic respiratory failure due to acute COVID viral pneumonitis. No pertinent PMH.   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 Able to complete ADL with S for energy conservation. Ambulated @ 450 ft using rollator initially on 4L, however able to titrate down and maintain SpO2  93 on 3L. Desat briefly to 89 on 2L with quick rebound to 92.Minimal if any SOB. Completed flutter valve, incentive spirometer and pursed lip breathing exercises. Pt feels more comfortable using a RW to ambulate because his "legs feel like noodles". No further OT needs after DC. Recommend continued mobility with nsg staff.     Follow Up Recommendations  No OT follow up;Supervision - Intermittent    Equipment Recommendations  Other (comment) (RW)   Recommendations for Other Services       Precautions / Restrictions Precautions Precautions: Fall;Other (comment) Precaution Comments: watch O2 Sats      Mobility Bed Mobility               General bed mobility comments: OOB in chair  Transfers Overall transfer level: Needs assistance   Transfers: Sit to/from Stand Sit to Stand: Supervision              Balance Overall balance assessment: No apparent balance deficits (not formally assessed)                                         ADL either performed or assessed with clinical judgement   ADL Overall ADL's : Needs assistance/impaired                                     Functional mobility during ADLs: Rolling walker;Supervision/safety General ADL Comments: Ablet o complete figure four position for LB ADL. Overall set up for UB and minguard for  LB; requires VC for pacing and energy conservation. Educated pt on need for shower seat. Pt verbalized understanding Spoke with wife over the phone. They have a shower seat to use. Recommend changing out to a hand held shower.      Vision Baseline Vision/History: Wears glasses       Perception     Praxis      Pertinent Vitals/Pain Pain Assessment: No/denies pain     Hand Dominance Right   Extremity/Trunk Assessment Upper Extremity Assessment Upper Extremity Assessment: Generalized weakness   Lower Extremity Assessment Lower Extremity Assessment: Defer to PT evaluation   Cervical / Trunk Assessment Cervical / Trunk Assessment: Normal   Communication Communication Communication: No difficulties   Cognition Arousal/Alertness: Awake/alert Behavior During Therapy: WFL for tasks assessed/performed Overall Cognitive Status: Within Functional Limits for tasks assessed                                     General Comments       Exercises Other Exercises Other Exercises: flutter valve x 5;incentive spirometer x 5 - pulls 1250 ml Other Exercises: pursed lip breathing with rib cage elevation  Shoulder Instructions      Home Living Family/patient expects to be discharged to:: Private residence Living Arrangements: Spouse/significant other Available Help at Discharge: Family;Available 24 hours/day Type of Home: House Home Access: Stairs to enter [employer] of Steps: 4 Entrance Stairs-Rails: None Home Layout: Two level Alternate Level Stairs-Number of Steps: Flight Alternate Level Stairs-Rails: Right Bathroom Shower/Tub: Walk-in shower;Tub/shower unit   Teacher, early years/pre: Yes How Accessible: Accessible via walker Home Equipment: Shower seat       Prior Functioning/Environment Level of Independence: Independent        Comments: designs furniture        OT Problem List: Decreased activity  tolerance;Decreased knowledge of use of DME or AE;Cardiopulmonary status limiting activity      OT Treatment/Interventions: Self-care/ADL training;Therapeutic exercise;Energy conservation;DME and/or AE instruction;Therapeutic activities;Patient/family education    OT Goals(Current goals can be found in the care plan section) Acute Rehab OT Goals Patient Stated Goal: to go home adn not get sick again OT Goal Formulation: With patient Time For Goal Achievement: 10/06/19 Potential to Achieve Goals: Good  OT Frequency: Min 2X/week   Barriers to D/C:            Co-evaluation              AM-PAC OT "6 Clicks" Daily Activity     Outcome Measure Help from another person eating meals?: None Help from another person taking care of personal grooming?: A Samuel Help from another person toileting, which includes using toliet, bedpan, or urinal?: A Samuel Help from another person bathing (including washing, rinsing, drying)?: A Samuel Help from another person to put on and taking off regular upper body clothing?: A Samuel Help from another person to put on and taking off regular lower body clothing?: A Samuel 6 Click Score: 19   End of Session Equipment Utilized During Treatment: Gait belt;Oxygen (rollator) Nurse Communication: Mobility status  Activity Tolerance: Patient tolerated treatment well Patient left: in chair;with call bell/phone within reach  OT Visit Diagnosis: Unsteadiness on feet (R26.81);Muscle weakness (generalized) (M62.81)                Time: 8466-5993 OT Time Calculation (min): 42 min Charges:  OT General Charges $OT Visit: 1 Visit OT Evaluation $OT Eval Moderate Complexity: 1 Mod OT Treatments $Self Care/Home Management : 8-22 mins $Therapeutic Exercise: 8-22 mins  Luisa Dago, OT/L   Acute OT Clinical Specialist Acute Rehabilitation Services Pager [phone] Office [phone]   [facility] 09/08/2019, 6:11 PM

## 2019-09-09 MED ORDER — CLONAZEPAM 1 MG PO TABS: 0.5000 mg | ORAL_TABLET | Freq: Every day | ORAL | 0 refills | Status: AC

## 2019-09-09 NOTE — Progress Notes (Signed)
Pt given discharge instructions, prescriptions, and care notes. Walker & O2 delivered to room, Pt instructed how to use. Pt verbalized understanding AEB no further questions or concerns at this time. IV was discontinued, no redness, pain, or swelling noted at this time. Telemetry discontinued and Centralized Telemetry was notified. Pt left the floor via wheelchair with staff in stable condition.

## 2019-09-09 NOTE — TOC Transition Note (Signed)
Transition of Care [facility]) - CM/SW Discharge Note   Patient Details  Name: Issam Carlyon MRN: [id number] Date of Birth: 05-Jun-1965  [facility]) CM/SW Contact:  Lawerance Sabal, RN Phone Number: 09/09/2019, 10:58 AM   Clinical Narrative:    Could not reach patient. Spoke w patient's wife. Discussed DME providers, agreeable to [employer]. Requested rollator as well. Referral placed to [employer] for O2, Rollator. Provided liaison w wife's number to set up home delivery. Wife is able to provide transportation home later today after 6pm. [employer] will deliver tanks to room, and set up concentrator at the house today.     Final next level of care: Home/Self Care Barriers to Discharge: No Barriers Identified   Patient Goals and CMS Choice Patient states their goals for this hospitalization and ongoing recovery are:: to go back home      Discharge Placement                       Discharge Plan and Services In-house Referral: NA Discharge Planning Services: CM Consult            DME Arranged: Oxygen, Walker rolling with seat DME Agency:  [facility]) Date DME Agency Contacted: 09/09/19 Time DME Agency Contacted: [phone] Representative spoke with at DME Agency: Vaughan Basta            Social Determinants of Health (SDOH) Interventions     Readmission Risk Interventions No flowsheet data found.

## 2019-09-09 NOTE — Discharge Summary (Signed)
Samuel [name], is a 54 y.o. male  DOB [birth year]  MRN [id number].  Admission date:  08/24/2019  Admitting Physician  Eduard Clos, MD  Discharge Date:  09/09/2019   Primary MD  Patient, [name]  Recommendations for primary care physician for things to follow:  -Please check CBC, CMP during next visit. 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 Gala Romney as an outpatient. -on Klonipin  during hospital stay, this is to be tapered as an outpatient   Admission Diagnosis  Hypoxia [R09.02] Pneumonia due to COVID-19 virus [U07.1, J12.82] Acute respiratory disease due to COVID-19 virus [U07.1, J06.9]   Discharge Diagnosis  Hypoxia [R09.02] Pneumonia due to COVID-19 virus [U07.1, J12.82] Acute respiratory disease due to COVID-19 virus [U07.1, J06.9]    Principal Problem:   Acute respiratory disease due to COVID-19 virus      History reviewed. No pertinent past medical history.  Past Surgical History:  Procedure Laterality Date  . HERNIA REPAIR         History of present illness and  Hospital Course:     Kindly see H&P for history of present illness and admission details, please review complete Labs, Consult reports and Test reports for all details in brief  HPI  from the history and physical done on the day of admission 08/24/2019  HPI: Samuel [name] is a 54 y.o. male with no significant past medical history presents to the ER at bedside at [facility] with complaint of shortness of breath.  Patient started having upper respiratory tract-like symptoms and headache about a week ago and was diagnosed with COVID-19 infection.  Patient was treating himself symptomatically.  Patient became progressively  short of breath and patient presents to the ER admits in [facility] on August 12.  ED Course: In the ER patient is found to be hypoxic with chest x-ray showing bilateral infiltrates.  Patient was started on remdesivir Decadron admitted for further management of COVID-19 pneumonia.  At the time of my exam patient is on 12 L oxygen.  Hospital Course   Acute Hypoxic Resp. Failure due to Acute Covid 19 Viral Pneumonitis during the ongoing 2020 Covid 19 Pandemic - - He has severe disease and is unfortunately unvaccinated. -Treated with IV steroids, he was treated with IV Solu-Medrol for severe disease, his steroids has been tapered during hospital stay, -Treated with IV remdesivir. -He received Actemra on admission. - Echocardiogram was non acute & - ve CTA of the chest.  -Patient with significant hypoxia during hospital stay, at one point during heated high flow nasal cannula, this has improved, he will be discharged on 3 L oxygen at rest and with activity. -Inflammatory marker has normalized, CRP less than 0.05, D-dimer is 0.5,.  Productive cough developed 08/31/2019.   - Question mild superimposed bacterial bronchitis or pneumonia.  Procalcitonin stable, treated with levofloxacin .  Rising  LFTs.   -Due to Covid pneumonia, remdesivir and Actemra, it is with mild fluctuation, but overall is stable.Marland Kitchen  GERD - PPI  Severe anxiety.    He was kept on Klonopin during hospital stay, as well on Lexapro, this is to be tapered on discharge, will be discharged on 0.5 mg p.o. daily for next 10 days, to be followed by PCP if he needs further tapering.  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  Right atrium and right ventricular pressures stable, Dr Thedore Mins  discussed with cardiologist Dr. Jones Broom.  Outpatient follow-up.     Discharge Condition:  stable   Follow UP   Follow-up Information    Gordan Payment., MD Follow up on 10/10/2019.   Specialty: Internal Medicine Why: at 2pm; Please try to keep your apt or call to reschedule Contact information: [address] [place] [address] [phone]        Post-COVID Care Clinic at [facility]. Go on 09/27/2019.   Specialty: Family Medicine Why: at 8am for an after COVID discharge follow up appointment. Please bring ID and arrive 15 minutes early.  Contact information: [address] [place] [address] [phone]       Bensimhon, Bevelyn Buckles, MD Follow up in 4 week(s).   Specialty: Cardiology Why: please call and schedule an appointment in 4 weeks Contact information: [address] [place] [address] [phone]                 Discharge Instructions  and  Discharge Medications    Discharge Instructions    Discharge instructions   Complete by: As directed    Follow with Primary MD in 7 days   Get CBC, CMP, checked  by Primary MD next visit.    Activity: As tolerated with Full fall precautions use walker/cane & assistance as needed   Disposition Home    Diet: Regular diet  On your next visit with your primary care physician please Get Medicines reviewed and adjusted.   Please request your Prim.MD to go over all Hospital Tests and Procedure/Radiological results at the follow up, please get all Hospital records sent to your Prim MD by signing hospital release before you go home.   If you experience worsening of your admission symptoms, develop shortness of breath, life threatening emergency, suicidal or homicidal thoughts you must seek medical attention immediately by calling 911 or calling your MD immediately  if symptoms less severe.  You Must read complete instructions/literature along with all the possible adverse reactions/side effects for all the Medicines you take and that have been prescribed to you. Take any new Medicines after you have completely understood  and accpet all the possible adverse reactions/side effects.   Do not drive, operating heavy machinery, perform activities at heights, swimming or participation in water activities or provide baby sitting services if your were admitted for syncope or siezures until you have seen by Primary MD or a Neurologist and advised to do so again.  Do not drive when taking Pain medications.    Do not take more than prescribed Pain, Sleep and Anxiety Medications  Special Instructions: If you have smoked or chewed Tobacco  in the last 2 yrs please stop smoking, stop any regular Alcohol  and or any Recreational drug use.  Wear Seat belts while driving.   Please note  You were cared for by a hospitalist during your  hospital stay. If you have any questions about your discharge medications or the care you received while you were in the hospital after you are discharged, you can call the unit and asked to speak with the hospitalist on call if the hospitalist that took care of you is not available. Once you are discharged, your primary care physician will handle any further medical issues. Please note that NO REFILLS for any discharge medications will be authorized once you are discharged, as it is imperative that you return to your primary care physician (or establish a relationship with a primary care physician if you do not have one) for your aftercare needs so that they can reassess your need for medications and monitor your lab values.   Increase activity slowly   Complete by: As directed      Allergies as of 09/09/2019   No Known Allergies     Medication List    TAKE these medications   clonazePAM 1 MG tablet Commonly known as: KLONOPIN Take 0.5 tablets (0.5 mg total) by mouth daily.   omeprazole 20 MG capsule Commonly known as: PRILOSEC Take 20 mg by mouth daily.   vitamin B-12 1000 MCG tablet Commonly known as: CYANOCOBALAMIN Take 1,000 mcg by mouth daily.            Durable Medical  Equipment  (From admission, onward)         Start     Ordered   09/09/19 1057  For home use only DME 4 wheeled rolling walker with seat  Once       Question:  Patient needs a walker to treat with the following condition  Answer:  Weakness   09/09/19 1056   09/08/19 1603  For home use only DME oxygen  Once       Question Answer Comment  Length of Need 6 Months   Mode or (Route) Nasal cannula   Liters per Minute 4   Oxygen conserving device Yes   Oxygen delivery system Gas      09/08/19 1602            Diet and Activity recommendation: See Discharge Instructions above   Consults obtained -   None  Major procedures and Radiology Reports - PLEASE review detailed and final reports for all details, in brief -      CT ANGIO CHEST PE W OR WO CONTRAST  Result Date: 08/27/2019 CLINICAL DATA:  COVID-19 infection, acute respiratory disease, concern for pulmonary embolus, short of breath EXAM: CT ANGIOGRAPHY CHEST WITH CONTRAST TECHNIQUE: Multidetector CT imaging of the chest was performed using the standard protocol during bolus administration of intravenous contrast. Multiplanar CT image reconstructions and MIPs were obtained to evaluate the vascular anatomy. CONTRAST:  67mL OMNIPAQUE IOHEXOL 350 MG/ML SOLN COMPARISON:  08/27/2019 FINDINGS: Cardiovascular: This is a technically adequate evaluation of the pulmonary vasculature. No filling defects or pulmonary emboli. The heart is unremarkable without pericardial effusion. Normal caliber of the thoracic aorta. Minimal atherosclerosis of the coronary vasculature. Incidental note is made partial anomalous pulmonary venous return with the left upper lobe pulmonary vein draining into the left brachiocephalic vein. Mediastinum/Nodes: No enlarged mediastinal, hilar, or axillary lymph nodes. Thyroid gland, trachea, and esophagus demonstrate no significant findings. There is a small hiatal hernia. Lungs/Pleura: Multifocal bilateral ground-glass  airspace disease is identified, greatest in the mid and lower lung zones, consistent with multifocal pneumonia and history of COVID-19. No effusion or pneumothorax. Central airways are patent. Upper Abdomen: No acute abnormality.  Musculoskeletal: No acute or destructive bony lesions. Reconstructed images demonstrate no additional findings. Review of the MIP images confirms the above findings. IMPRESSION: 1. No evidence of pulmonary embolus. 2. Multifocal bilateral ground-glass airspace disease, greatest in the mid and lower lung zones, consistent with multifocal pneumonia and history of COVID-19. 3. 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 4. Small hiatal hernia. Electronically Signed   By: Sharlet Salina M.D.   On: 08/27/2019 15:52   DG Chest Port 1 View  Result Date: 09/03/2019 CLINICAL DATA:  Decreased oxygen saturation EXAM: PORTABLE CHEST 1 VIEW COMPARISON:  September 02, 2019 FINDINGS: Airspace opacity seen throughout both mid and lower lung regions, similar to 1 day prior. Heart size is upper normal with pulmonary vascularity normal. No adenopathy. No bone lesions. IMPRESSION: Stable airspace opacity throughout the mid and lower lung regions. Suspect atypical organism pneumonia. Appearance similar to 1 day prior. Stable cardiac silhouette. Electronically Signed   By: Bretta Bang III M.D.   On: 09/03/2019 09:21   DG Chest Port 1 View  Result Date: 09/02/2019 CLINICAL DATA:  COVID-19.  Shortness of breath. EXAM: PORTABLE CHEST 1 VIEW COMPARISON:  August 31, 2019 FINDINGS: Increasing bilateral pulmonary infiltrates.  No other changes. IMPRESSION: Increasing bilateral pulmonary infiltrates consistent with worsening COVID-19 pneumonia. No other change. Electronically Signed   By: Gerome Sam III M.D   On: 09/02/2019 10:22   DG CHEST PORT 1 VIEW  Result Date: 08/31/2019 CLINICAL DATA:  Shortness of  breath.  COVID. EXAM: PORTABLE CHEST 1 VIEW COMPARISON:  08/30/2019. FINDINGS: Mediastinum hilar structures normal. Heart size stable. Low lung volumes with persistent bibasilar pulmonary infiltrates. Similar findings on prior exam. No pleural effusion or pneumothorax. IMPRESSION: Low lung volumes with persistent bibasilar infiltrates in this known COVID positive patient. Similar findings noted on prior exam. Electronically Signed   By: Maisie Fus  Register   On: 08/31/2019 07:16   DG Chest Port 1 View  Result Date: 08/30/2019 CLINICAL DATA:  COVID positive EXAM: PORTABLE CHEST 1 VIEW COMPARISON:  August 27, 2019 FINDINGS: Cardiomediastinal contours are partially obscured by increasing basilar opacities since the prior study. Lung volumes are diminished compared to the previous exam. On limited assessment no acute skeletal process. IMPRESSION: 1. Worsening basilar airspace disease and slight decrease in lung volumes in the setting of COVID-19 pneumonia. Electronically Signed   By: Donzetta Kohut M.D.   On: 08/30/2019 08:17   DG Chest Port 1 View  Result Date: 08/27/2019 CLINICAL DATA:  Shortness of breath EXAM: PORTABLE CHEST 1 VIEW COMPARISON:  08/24/2019 chest radiograph. FINDINGS: Peripheral predominant streaky pulmonary opacities are unchanged. No pneumothorax or pleural effusion. Cardiomediastinal silhouette is unchanged. No acute osseous abnormality. IMPRESSION: Multifocal pneumonia, grossly unchanged. Electronically Signed   By: Stana Bunting M.D.   On: 08/27/2019 09:51   DG Chest Portable 1 View  Result Date: 08/24/2019 CLINICAL DATA:  Cough fever COVID positive EXAM: PORTABLE CHEST 1 VIEW COMPARISON:  None. FINDINGS: Streaky bilateral pulmonary opacities. Normal heart size. No pneumothorax or pleural effusion IMPRESSION: Streaky bilateral pulmonary opacities suspicious for bilateral pneumonia. Electronically Signed   By: Jasmine Pang M.D.   On: 08/24/2019 00:41   ECHOCARDIOGRAM  COMPLETE  Result Date: 08/27/2019    ECHOCARDIOGRAM REPORT   Patient Name:   Samuel [name] Date of Exam: 08/27/2019 Medical Rec #:  [id number]  Height:       72.0 in Accession #:    [id number]               Weight:       203.0 lb Date of Birth:  April 09, 1965                BSA:          2.144 m Patient Age:    54 years                 BP:           121/80 mmHg Patient Gender: M                        HR:           74 bpm. Exam Location:  Inpatient Procedure: 2D Echo and Intracardiac Opacification Agent Indications:    CHF-Acute Diastolic 428.31 / I50.31  History:        Patient has no prior history of Echocardiogram examinations.                 Signs/Symptoms:Shortness of Breath. Covid 19. GERD.  Sonographer:    Leta Jungling RDCS Referring Phys: Heide Scales [facility]  Sonographer Comments: Suboptimal apical window and suboptimal subcostal window. IMPRESSIONS  1. Left ventricular ejection fraction, by estimation, is 60 to 65%. The left ventricle has normal function. The left ventricle has no regional wall motion abnormalities. There is mild left ventricular hypertrophy. Left ventricular diastolic parameters were normal.  2. Right ventricular systolic function is normal. The right ventricular size is normal.  3. The mitral valve is normal in structure. No evidence of mitral valve regurgitation. No evidence of mitral stenosis.  4. The aortic valve is normal in structure. Aortic valve regurgitation is not visualized. No aortic stenosis is present.  5. The inferior vena cava is normal in size with greater than 50% respiratory variability, suggesting right atrial pressure of 3 mmHg. FINDINGS  Left Ventricle: Left ventricular ejection fraction, by estimation, is 60 to 65%. The left ventricle has normal function. The left ventricle has no regional wall motion abnormalities. Definity contrast agent was given IV to delineate the left ventricular  endocardial borders. The left ventricular internal  cavity size was normal in size. There is mild left ventricular hypertrophy. Left ventricular diastolic parameters were normal. Right Ventricle: The right ventricular size is normal. No increase in right ventricular wall thickness. Right ventricular systolic function is normal. Left Atrium: Left atrial size was normal in size. Right Atrium: Right atrial size was normal in size. Pericardium: There is no evidence of pericardial effusion. Mitral Valve: The mitral valve is normal in structure. Normal mobility of the mitral valve leaflets. No evidence of mitral valve regurgitation. No evidence of mitral valve stenosis. Tricuspid Valve: The tricuspid valve is normal in structure. Tricuspid valve regurgitation is not demonstrated. No evidence of tricuspid stenosis. Aortic Valve: The aortic valve is normal in structure. Aortic valve regurgitation is not visualized. No aortic stenosis is present. Pulmonic Valve: The pulmonic valve was normal in structure. Pulmonic valve regurgitation is not visualized. No evidence of pulmonic stenosis. Aorta: The aortic root is normal in size and structure. Venous: The inferior vena cava is normal in size with greater than 50% respiratory variability, suggesting right atrial pressure of 3 mmHg. IAS/Shunts: The interatrial septum was not well visualized.  LEFT VENTRICLE PLAX 2D LVIDd:  4.20 cm  Diastology LVIDs:         3.00 cm  LV e' lateral:   4.90 cm/s LV PW:         0.70 cm  LV E/e' lateral: 14.1 LV IVS:        2.45 cm  LV e' medial:    4.57 cm/s LVOT diam:     2.30 cm  LV E/e' medial:  15.1 LV SV:         74 LV SV Index:   34 LVOT Area:     4.15 cm  LEFT ATRIUM         Index LA diam:    2.10 cm 0.98 cm/m  AORTIC VALVE LVOT Vmax:   57.20 cm/s LVOT Vmean:  59.100 cm/s LVOT VTI:    0.178 m  AORTA Ao Root diam: 3.70 cm MITRAL VALVE MV Area (PHT): 2.71 cm    SHUNTS MV Decel Time: 280 msec    Systemic VTI:  0.18 m MV E velocity: 69.20 cm/s  Systemic Diam: 2.30 cm MV A velocity: 65.55  cm/s MV E/A ratio:  1.06 Charlton Haws MD Electronically signed by Charlton Haws MD Signature Date/Time: 08/27/2019/10:56:13 AM    Final    US Abdomen Limited RUQ  Result Date: 08/27/2019 CLINICAL DATA:  Elevated liver enzymes.  Inpatient. EXAM: ULTRASOUND ABDOMEN LIMITED RIGHT UPPER QUADRANT COMPARISON:  None. FINDINGS: Unable to turn patient into decubitus position due to restraints, limiting assessment. Gallbladder: No gallstones or wall thickening visualized. No sonographic Murphy sign noted by sonographer. Common bile duct: CBD not discretely visualized. No evidence of intrahepatic biliary ductal dilatation. Liver: Liver parenchyma is top-normal in echogenicity. No liver surface irregularity. No liver masses. Portal vein is patent on color Doppler imaging with normal direction of blood flow towards the liver. Other: None. IMPRESSION: Limited scan, see comments. No acute abnormality. No cholelithiasis. Electronically Signed   By: Delbert Phenix M.D.   On: 08/27/2019 18:31    Micro Results    Recent Results (from the past 240 hour(s))  MRSA PCR Screening     Status: None   Collection Time: 09/02/19 10:48 PM   Specimen: Nasal Mucosa; Nasopharyngeal  Result Value Ref Range Status   MRSA by PCR NEGATIVE NEGATIVE Final    Comment:        The GeneXpert MRSA Assay (FDA approved for NASAL specimens only), is one component of a comprehensive MRSA colonization surveillance program. It is not intended to diagnose MRSA infection nor to guide or monitor treatment for MRSA infections. Performed at [facility], [address], [place], [place] [address]   Expectorated sputum assessment w rflx to resp cult     Status: None   Collection Time: 09/02/19 10:49 PM   Specimen: Sputum  Result Value Ref Range Status   Specimen Description SPUTUM  Final   Special Requests NONE  Final   Sputum evaluation   Final    THIS SPECIMEN IS ACCEPTABLE FOR SPUTUM CULTURE Performed at [facility],  [address], [place], [place] [address]    Report Status 09/03/2019 FINAL  Final  Culture, respiratory     Status: None   Collection Time: 09/02/19 10:49 PM   Specimen: SPU  Result Value Ref Range Status   Specimen Description SPUTUM  Final   Special Requests NONE Reflexed from [id number]  Final   Gram Stain   Final    ABUNDANT WBC PRESENT,BOTH PMN AND MONONUCLEAR MODERATE GRAM POSITIVE COCCI  RARE GRAM VARIABLE ROD    Culture   Final    FEW Normal respiratory flora-no Staph aureus or Pseudomonas seen Performed at [facility], [address] [place], [place] [id number]    Report Status 09/06/2019 FINAL  Final       Today   Subjective:   Samuel [name] today has no headache,no chest or abdominal pain,no new weakness tingling or numbness, feels much better wants to go home today.  Tolerated ambulation in the hallway with 3 L nasal cannula  Objective:   Blood pressure 105/74, pulse 62, temperature 98.2 F (36.8 C), temperature source Oral, resp. rate 16, height 6' (1.829 m), weight 92.1 kg, SpO2 99 %.   Intake/Output Summary (Last 24 hours) at 09/09/2019 1100 Last data filed at 09/09/2019 0804 Gross per 24 hour  Intake 220 ml  Output 1300 ml  Net -1080 ml    Exam Awake Alert, Oriented x 3, No new F.N deficits, Normal affect Symmetrical Chest wall movement, Good air movement bilaterally, CTAB RRR,No Gallops,Rubs or new Murmurs, No Parasternal Heave +ve B.Sounds, Abd Soft No Cyanosis, Clubbing or edema, No new Rash or bruise  Data Review   CBC w Diff:  Lab Results  Component Value Date   WBC 14.0 (H) 09/08/2019   HGB 14.5 09/08/2019   HCT 42.6 09/08/2019   PLT 294 09/08/2019   LYMPHOPCT 5 09/06/2019   MONOPCT 3 09/06/2019   EOSPCT 0 09/06/2019   BASOPCT 0 09/06/2019    CMP:  Lab Results  Component Value Date   NA 134 (L) 09/08/2019   K 4.0 09/08/2019   CL 98 09/08/2019   CO2 27 09/08/2019   BUN 24 (H) 09/08/2019   CREATININE 0.95 09/08/2019   PROT  5.3 (L) 09/08/2019   ALBUMIN 2.8 (L) 09/08/2019   BILITOT 0.6 09/08/2019   ALKPHOS 52 09/08/2019   AST 20 09/08/2019   ALT 64 (H) 09/08/2019  .   Total Time in preparing paper work, data evaluation and todays exam - 35 minutes  Samuel [name] M.D on 09/09/2019 at 11:00 AM  [facility]   Office  [phone]

## 2019-09-09 NOTE — Discharge Instructions (Signed)
Person Under Monitoring Name: Samuel [name]  Location: [address] [place] [address]   Infection Prevention Recommendations for Individuals Confirmed to have, or Being Evaluated for, 2019 Novel Coronavirus (COVID-19) Infection Who Receive Care at Home  Individuals who are confirmed to have, or are being evaluated for, COVID-19 should follow the prevention steps below until a healthcare provider or local or state health department says they can return to normal activities.  Stay home except to get medical care You should restrict activities outside your home, except for getting medical care. Do not go to work, school, or public areas, and do not use public transportation or taxis.  Call ahead before visiting your doctor Before your medical appointment, call the healthcare provider and tell them that you have, or are being evaluated for, COVID-19 infection. This will help the healthcare provider's office take steps to keep other people from getting infected. Ask your healthcare provider to call the local or state health department.  Monitor your symptoms Seek prompt medical attention if your illness is worsening (e.g., difficulty breathing). Before going to your medical appointment, call the healthcare provider and tell them that you have, or are being evaluated for, COVID-19 infection. Ask your healthcare provider to call the local or state health department.  Wear a facemask You should wear a facemask that covers your nose and mouth when you are in the same room with other people and when you visit a healthcare provider. People who live with or visit you should also wear a facemask while they are in the same room with you.  Separate yourself from other people in your home As much as possible, you should stay in a different room from other people in your home. Also, you should use a separate bathroom, if available.  Avoid sharing household items You  should not share dishes, drinking glasses, cups, eating utensils, towels, bedding, or other items with other people in your home. After using these items, you should wash them thoroughly with soap and water.  Cover your coughs and sneezes Cover your mouth and nose with a tissue when you cough or sneeze, or you can cough or sneeze into your sleeve. Throw used tissues in a lined trash can, and immediately wash your hands with soap and water for at least 20 seconds or use an alcohol-based hand rub.  Wash your [employer] your hands often and thoroughly with soap and water for at least 20 seconds. You can use an alcohol-based hand sanitizer if soap and water are not available and if your hands are not visibly dirty. Avoid touching your eyes, nose, and mouth with unwashed hands.   Prevention Steps for Caregivers and Household Members of Individuals Confirmed to have, or Being Evaluated for, COVID-19 Infection Being Cared for in the Home  If you live with, or provide care at home for, a person confirmed to have, or being evaluated for, COVID-19 infection please follow these guidelines to prevent infection:  Follow healthcare provider's instructions Make sure that you understand and can help the patient follow any healthcare provider instructions for all care.  Provide for the patient's basic needs You should help the patient with basic needs in the home and provide support for getting groceries, prescriptions, and other personal needs.  Monitor the patient's symptoms If they are getting sicker, call his or her medical provider and tell them that the patient has, or is being evaluated for, COVID-19 infection. This will help the healthcare provider's office  take steps to keep other people from getting infected. Ask the healthcare provider to call the local or state health department.  Limit the number of people who have contact with the patient  If possible, have only one caregiver for the  patient.  Other household members should stay in another home or place of residence. If this is not possible, they should stay  in another room, or be separated from the patient as much as possible. Use a separate bathroom, if available.  Restrict visitors who do not have an essential need to be in the home.  Keep older adults, very young children, and other sick people away from the patient Keep older adults, very young children, and those who have compromised immune systems or chronic health conditions away from the patient. This includes people with chronic heart, lung, or kidney conditions, diabetes, and cancer.  Ensure good ventilation Make sure that shared spaces in the home have good air flow, such as from an air conditioner or an opened window, weather permitting.  Wash your hands often  Wash your hands often and thoroughly with soap and water for at least 20 seconds. You can use an alcohol based hand sanitizer if soap and water are not available and if your hands are not visibly dirty.  Avoid touching your eyes, nose, and mouth with unwashed hands.  Use disposable paper towels to dry your hands. If not available, use dedicated cloth towels and replace them when they become wet.  Wear a facemask and gloves  Wear a disposable facemask at all times in the room and gloves when you touch or have contact with the patient's blood, body fluids, and/or secretions or excretions, such as sweat, saliva, sputum, nasal mucus, vomit, urine, or feces.  Ensure the mask fits over your nose and mouth tightly, and do not touch it during use.  Throw out disposable facemasks and gloves after using them. Do not reuse.  Wash your hands immediately after removing your facemask and gloves.  If your personal clothing becomes contaminated, carefully remove clothing and launder. Wash your hands after handling contaminated clothing.  Place all used disposable facemasks, gloves, and other waste in a lined  container before disposing them with other household waste.  Remove gloves and wash your hands immediately after handling these items.  Do not share dishes, glasses, or other household items with the patient  Avoid sharing household items. You should not share dishes, drinking glasses, cups, eating utensils, towels, bedding, or other items with a patient who is confirmed to have, or being evaluated for, COVID-19 infection.  After the person uses these items, you should wash them thoroughly with soap and water.  Wash laundry thoroughly  Immediately remove and wash clothes or bedding that have blood, body fluids, and/or secretions or excretions, such as sweat, saliva, sputum, nasal mucus, vomit, urine, or feces, on them.  Wear gloves when handling laundry from the patient.  Read and follow directions on labels of laundry or clothing items and detergent. In general, wash and dry with the warmest temperatures recommended on the label.  Clean all areas the individual has used often  Clean all touchable surfaces, such as counters, tabletops, doorknobs, bathroom fixtures, toilets, phones, keyboards, tablets, and bedside tables, every day. Also, clean any surfaces that may have blood, body fluids, and/or secretions or excretions on them.  Wear gloves when cleaning surfaces the patient has come in contact with.  Use a diluted bleach solution (e.g., dilute bleach with 1 part  bleach and 10 parts water) or a household disinfectant with a label that says EPA-registered for coronaviruses. To make a bleach solution at home, add 1 tablespoon of bleach to 1 quart (4 cups) of water. For a larger supply, add  cup of bleach to 1 gallon (16 cups) of water.  Read labels of cleaning products and follow recommendations provided on product labels. Labels contain instructions for safe and effective use of the cleaning product including precautions you should take when applying the product, such as wearing gloves or  eye protection and making sure you have good ventilation during use of the product.  Remove gloves and wash hands immediately after cleaning.  Monitor yourself for signs and symptoms of illness Caregivers and household members are considered close contacts, should monitor their health, and will be asked to limit movement outside of the home to the extent possible. Follow the monitoring steps for close contacts listed on the symptom monitoring form.   ? If you have additional questions, contact your local health department or call the epidemiologist on call at [phone] (available 24/7). ? This guidance is subject to change. For the most up-to-date guidance from [name], please refer to their website: [email or web address]

## 2019-09-09 NOTE — Progress Notes (Signed)
SATURATION QUALIFICATIONS: (This note is used to comply with regulatory documentation for home oxygen)  Patient Saturations on Room Air at Rest = 93%  Patient Saturations on Room Air while Ambulating = 85%  Patient Saturations on 3 Liters of oxygen while Ambulating = 90%  Please briefly explain why patient needs home oxygen: Pt needs home O2 while ambulating in order to maintain sats above 88%

## 2019-09-10 DIAGNOSIS — F5102 Adjustment insomnia: Secondary | ICD-10-CM | POA: Insufficient documentation

## 2019-09-10 DIAGNOSIS — R4589 Other symptoms and signs involving emotional state: Secondary | ICD-10-CM

## 2019-09-13 ENCOUNTER — Ambulatory Visit (INDEPENDENT_AMBULATORY_CARE_PROVIDER_SITE_OTHER): Payer: 59 | Admitting: Internal Medicine

## 2019-09-13 ENCOUNTER — Other Ambulatory Visit: Payer: Self-pay

## 2019-09-13 ENCOUNTER — Encounter: Payer: Self-pay | Admitting: Internal Medicine

## 2019-09-13 DIAGNOSIS — R05 Cough: Secondary | ICD-10-CM | POA: Diagnosis not present

## 2019-09-13 DIAGNOSIS — R059 Cough, unspecified: Secondary | ICD-10-CM

## 2019-09-13 DIAGNOSIS — U071 COVID-19: Secondary | ICD-10-CM

## 2019-09-13 DIAGNOSIS — J9611 Chronic respiratory failure with hypoxia: Secondary | ICD-10-CM

## 2019-09-13 DIAGNOSIS — J069 Acute upper respiratory infection, unspecified: Secondary | ICD-10-CM

## 2019-09-13 HISTORY — DX: Chronic respiratory failure with hypoxia: J96.11

## 2019-09-13 HISTORY — DX: Cough, unspecified: R05.9

## 2019-09-13 NOTE — Patient Instructions (Addendum)
Make sure you check your oxygen saturations at highest level of activity to be sure it stays over 90% and adjust upward to maintain this level if needed but remember to turn it back to previous settings when you stop (to conserve your supply).   For cough/wheezing > combivent one puff every 4 hours as needed    Prilosec 20 mg Take 30- 60 min before your first and last meals of the day   mucinex dm 1200 mg  Every 12 hours as needed   GERD (REFLUX)  is an extremely common cause of respiratory symptoms just like yours , many times with no obvious heartburn at all.    It can be treated with medication, but also with lifestyle changes including elevation of the head of your bed (ideally with 6 -8inch blocks under the headboard of your bed),  Smoking cessation, avoidance of late meals, excessive alcohol, and avoid fatty foods, chocolate, peppermint, colas, red wine, and acidic juices such as orange juice.  NO MINT OR MENTHOL PRODUCTS SO NO COUGH DROPS  USE SUGARLESS CANDY INSTEAD (Jolley ranchers or Stover's or Life Savers) or even ice chips will also do - the key is to swallow to prevent all throat clearing. NO OIL BASED VITAMINS - use powdered substitutes.  Avoid fish oil when coughing.    Please schedule a follow up office visit in 4 weeks, sooner if needed

## 2019-09-13 NOTE — Assessment & Plan Note (Signed)
Onset of symptoms 08/18/19 sp dex/ remdesovir and actemra  - restarted prednisone per PCP 09/10/19 along with gabapentin for parasthesias  - 09/13/2019  After extensive coaching inhaler device,  effectiveness =    0% > 75% > continue combivent 4 h prn   Advised on use of combivent I spent extra time with pt today reviewing appropriate use of albuterol for prn use on exertion with the following points: 1) saba is for relief of sob that does not improve by walking a slower pace or resting but rather if the pt does not improve after trying this first. 2) If the pt is convinced, as many are, that saba helps recover from activity faster then it's easy to tell if this is the case by re-challenging : ie stop, take the inhaler, then p 5 minutes try the exact same activity (intensity of workload) that just caused the symptoms and see if they are substantially diminished or not after saba 3) if there is an activity that reproducibly causes the symptoms, try the saba 15 min before the activity on alternate days   If in fact the saba really does help, then fine to continue to use it prn but advised may need to look closer at the maintenance regimen being used to achieve better control of airways disease with exertion.   Other than treat the cough (see separate a/p) and the low sats (see separate a/p) just needs more time and progressive ambulation and f/u here in 4 weeks with cxr as long as making slow but steady progress which appears to be the case so far.

## 2019-09-13 NOTE — Assessment & Plan Note (Addendum)
Onset with covid 08/2019  - rx 09/13/2019 = max gerd rx / gabapentin   Of the three most common causes of  Sub-acute / recurrent or chronic cough, only one (GERD)  can actually contribute to/ trigger  the other two (asthma and post nasal drip syndrome)  and perpetuate the cylce of cough.  While not intuitively obvious, many patients with chronic low grade reflux do not cough until there is a primary insult that disturbs the protective epithelial barrier and exposes sensitive nerve endings.   This is typically viral but can due to PNDS and  former > latter  applies here.   The point is that once this occurs, it is difficult to eliminate the cycle  using anything but a maximally effective acid suppression regimen at least in the short run, accompanied by an appropriate diet to address non acid GERD and control / eliminate the cough itself with use of flutter, max mucinex, gerd rx and starting gabapentin for parasthesias may help.   Medical decision making was a moderate level of complexity in this case because of > two chronic unresolved  conditions /diagnoses requiring extra time for  H and P, chart review, counseling, teaching smi and generating customized AVS unique to this office visit and charting. Time spent = 55 min    Each maintenance medication was reviewed in detail including emphasizing most importantly the difference between maintenance and prns and under what circumstances the prns are to be triggered using an action plan format where appropriate. Please see avs for details which were reviewed in writing by both me and my nurse and patient given a written copy highlighted where appropriate with yellow highlighter for the patient's continued care at home along with an updated version of their medications.  Patient was asked to maintain medication reconciliation by comparing this list to the actual medications being used at home and to contact this office right away if there is a conflict or  discrepancy.

## 2019-09-13 NOTE — Assessment & Plan Note (Signed)
As dc/ from Covid admit 08/2019:  3lpm hs, titrate daytime to keep sats >90% at all times   Advised: Make sure you check your oxygen saturations at highest level of activity to be sure it stays over 90% and adjust upward to maintain this level if needed but remember to turn it back to previous settings when you stop (to conserve your supply).

## 2019-09-13 NOTE — Progress Notes (Signed)
Samuel [name], male    DOB: 04/28/1965,    MRN: [id number]   Brief patient profile:   Admission date:  08/24/2019  Admitting Physician  Eduard Clos, MD  Discharge Date:  09/09/2019      Recommendations for primary care physician for things to follow:  -Please check CBC, CMP during next visit. 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 Gala Romney as an outpatient. -on Klonipin  during hospital stay, this is to be tapered as an outpatient   Admission Diagnosis  Hypoxia [R09.02] Pneumonia due to COVID-19 virus [U07.1, J12.82] Acute respiratory disease due to COVID-19 virus [U07.1, J06.9]   Discharge Diagnosis  Hypoxia [R09.02] Pneumonia due to COVID-19 virus [U07.1, J12.82] Acute respiratory disease due to COVID-19 virus [U07.1, J06.9]    Principal Problem:   Acute respiratory disease due to COVID-19 virus         History of present illness and  Hospital Course:     Kindly see H&P for history of present illness and admission details, please review complete Labs, Consult reports and Test reports for all details in brief  HPI  from the history and physical done on the day of admission 08/24/2019  LPF:[id number] Samuel [name] a 54 y.o.malewithno significant past medical history presents to the ER at bedside at [facility] with complaint of shortness of breath. Patient started having upper respiratory tract-like symptoms and headache about a week PTA and was diagnosed with COVID-19 infection. Patient was treating himself symptomatically. Patient became progressively short of breath and patient presented to the ER admits in [facility] on August 12.  ED Course:In the ER patient is found to be hypoxic with chest x-ray showing bilateral infiltrates. Patient was started on  remdesivir Decadron admitted for further management of COVID-19 pneumonia.    Hospital Course   Acute Hypoxic Resp. Failure due to Acute Covid 19 Viral Pneumonitis during the ongoing 2020 Covid 19 Pandemic- - He has severe disease and is unfortunately unvaccinated. -Treated with IV steroids, he was treated with IV Solu-Medrol for severe disease, his steroids has been tapered during hospital stay, -Treated with IV remdesivir. -He received Actemra on admission. - Echocardiogram was non acute &- ve CTA of the chest.  -Patient with significant hypoxia during hospital stay, at one point during heated high flow nasal cannula, this has improved, he will be discharged on 3 L oxygen at rest and with activity. -Inflammatory marker has normalized, CRP less than 0.05, D-dimer is 0.5,.  Productive cough developed 08/31/2019.  - Question mild superimposed bacterial bronchitis or pneumonia. Procalcitonin stable, treated with levofloxacin .  Rising LFTs.  -Due to Covid pneumonia, remdesivir and Actemra, it is with mild fluctuation, but overall is stable.Marland Kitchen  GERD- PPI  Severe anxiety.  He was kept on Klonopin during hospital stay, as well on Lexapro, this is to be tapered on discharge, will be discharged on 0.5 mg p.o. daily for next 10 days, to be followed by PCP if he needs further tapering.  Mild hyponatremia- SIADH improved with Lasix.  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 Right atrium and right ventricular pressures stable, Dr Thedore Mins  discussed with cardiologist Dr. Jones Broom. Outpatient follow-up.         History of Present Illness  09/13/2019  Pulmonary/ 1st office eval/Samuel [name]  covid Pna / resp failure first symptoms  08/18/19  Chief Complaint  Patient presents with  . Pulmonary Consult    dx with covid 08/23/19- started on o2 24/7 2-4lpm. He states he feels that his breathing is improving. He states he has some sinus congestion.     never smoker with spring time rhinitis / otc's no wheezing  Dyspnea:  Slow improvement still 02 dep  Cough: dry /worse talking  Sleep: 45 degrees on side   02  At rest 92% on RA,  On 02 2lpm daytime with activity/ does not titrate , hs 3lpm  PCP rx 300 mg gabapentin hs  combivent prn (note can't use smi at baseline) thinks it helps but only useses p ex    No obvious day to day or daytime variability or assoc excess/ purulent sputum or mucus plugs or hemoptysis or cp or chest tightness, subjective wheeze or overt hb symptoms.     Also denies any obvious fluctuation of symptoms with weather or environmental changes or other aggravating or alleviating factors except as outlined above   No unusual exposure hx or h/o childhood pna/ asthma or knowledge of premature birth.  Current Allergies, Complete Past Medical History, Past Surgical History, Family History, and Social History were reviewed in [employer] record.  ROS  The following are not active complaints unless bolded Hoarseness, sore throat, dysphagia, dental problems, itching, sneezing,  nasal congestion or discharge of excess mucus or purulent secretions, ear ache,   fever, chills, sweats, unintended wt loss or wt gain, classically pleuritic or exertional cp,  orthopnea pnd or arm/hand swelling  or leg swelling, presyncope, palpitations, abdominal pain, anorexia, nausea, vomiting, diarrhea  or change in bowel habits or change in bladder habits, change in stools or change in urine, dysuria, hematuria,  rash, arthralgias, visual complaints, headache, numbness, weakness or ataxia or problems with walking or coordination,  change in mood or  Memory. Painful parathesias hand / feet better on gabapentin              No past medical history on file.  Outpatient Medications Prior to Visit  Medication Sig Dispense Refill  . aspirin EC 81 MG tablet Take 81 mg by mouth daily. Swallow whole.    . cholecalciferol (VITAMIN  D3) 25 MCG (1000 UNIT) tablet Take 1,000 Units by mouth daily.    . clonazePAM (KLONOPIN) 1 MG tablet Take 0.5 tablets (0.5 mg total) by mouth daily. 5 tablet 0  . escitalopram (LEXAPRO) 5 MG tablet Take 5 mg by mouth daily.    Marland Kitchen gabapentin (NEURONTIN) 300 MG capsule Take 1 capsule by mouth at bedtime.    . Ipratropium-Albuterol (COMBIVENT RESPIMAT) 20-100 MCG/ACT AERS respimat Inhale 1 puff into the lungs daily.    Marland Kitchen omeprazole (PRILOSEC) 20 MG capsule Take 20 mg by mouth daily.    . predniSONE (DELTASONE) 10 MG tablet Take 10 mg by mouth as directed.    . vitamin B-12 (CYANOCOBALAMIN) 1000 MCG tablet Take 1,000 mcg by mouth daily.     No facility-administered medications prior to visit.     Objective:     BP 114/74 (BP Location: Left Arm, Cuff Size: Normal)   Pulse 71   Temp 98.7 F (37.1 C) (Temporal)   Ht 6' (1.829 m)   Wt 192 lb (87.1 kg)   SpO2 95%   BMI 26.04 kg/m   SpO2: 95 % O2 Type: Pulse O2 O2 Flow Rate (L/min): 4 L/min (4lpm pulsed o2)   amb wm looks uncomfortable but not  toxic at all    HEENT : pt wearing mask not removed for exam due to covid -19 concerns.    NECK :  without JVD/Nodes/TM/ nl carotid upstrokes bilaterally   LUNGS: no acc muscle use,  Nl contour chest with crackles on insp bases  bilaterally with cough on insp  maneuvers   CV:  RRR  no s3 or murmur or increase in P2, and no edema   ABD:  soft and nontender with nl inspiratory excursion in the supine position. No bruits or organomegaly appreciated, bowel sounds nl  MS:  Nl gait/ ext warm without deformities, calf tenderness, cyanosis or clubbing No obvious joint restrictions   SKIN: warm and dry without lesions    NEURO:  alert, approp, nl sensorium with  no motor or cerebellar deficits apparent.     I personally reviewed images and agree with radiology impression as follows:  CXR:   09/03/19  stable airspace opacity throughout the mid and lower lung regions. Suspect atypical  organism pneumonia. Appearance similar to 1 day prior. Stable cardiac silhouette.   Labs   reviewed:      Chemistry      Component Value Date/Time   NA 134 (L) 09/08/2019 0121   K 4.0 09/08/2019 0121   CL 98 09/08/2019 0121   CO2 27 09/08/2019 0121   BUN 24 (H) 09/08/2019 0121   CREATININE 0.95 09/08/2019 0121      Component Value Date/Time   CALCIUM 8.5 (L) 09/08/2019 0121   ALKPHOS 52 09/08/2019 0121   AST 20 09/08/2019 0121   ALT 64 (H) 09/08/2019 0121   BILITOT 0.6 09/08/2019 0121        Lab Results  Component Value Date   WBC 14.0 (H) 09/08/2019   HGB 14.5 09/08/2019   HCT 42.6 09/08/2019   MCV 91.8 09/08/2019   PLT 294 09/08/2019     Lab Results  Component Value Date   DDIMER 0.50 09/06/2019          Assessment   Acute respiratory disease due to COVID-19 virus Onset of symptoms 08/18/19 sp dex/ remdesovir and actemra  - restarted prednisone per PCP 09/10/19 along with gabapentin for parasthesias  - 09/13/2019  After extensive coaching inhaler device,  effectiveness =    0% > 75% > continue combivent 4 h prn   Advised on use of combivent I spent extra time with pt today reviewing appropriate use of albuterol for prn use on exertion with the following points: 1) saba is for relief of sob that does not improve by walking a slower pace or resting but rather if the pt does not improve after trying this first. 2) If the pt is convinced, as many are, that saba helps recover from activity faster then it's easy to tell if this is the case by re-challenging : ie stop, take the inhaler, then p 5 minutes try the exact same activity (intensity of workload) that just caused the symptoms and see if they are substantially diminished or not after saba 3) if there is an activity that reproducibly causes the symptoms, try the saba 15 min before the activity on alternate days   If in fact the saba really does help, then fine to continue to use it prn but advised may need to look  closer at the maintenance regimen being used to achieve better control of airways disease with exertion.   Other than treat the cough (see separate a/p) and the low sats (see separate a/p)  just needs more time and progressive ambulation and f/u here in 4 weeks with cxr as long as making slow but steady progress which appears to be the case so far.    Chronic respiratory failure with hypoxia (HCC) As dc/ from Covid admit 08/2019:  3lpm hs, titrate daytime to keep sats >90% at all times   Advised: Make sure you check your oxygen saturations at highest level of activity to be sure it stays over 90% and adjust upward to maintain this level if needed but remember to turn it back to previous settings when you stop (to conserve your supply).    Cough Onset with covid 08/2019  - rx 09/13/2019 = max gerd rx / gabapentin   Of the three most common causes of  Sub-acute / recurrent or chronic cough, only one (GERD)  can actually contribute to/ trigger  the other two (asthma and post nasal drip syndrome)  and perpetuate the cylce of cough.  While not intuitively obvious, many patients with chronic low grade reflux do not cough until there is a primary insult that disturbs the protective epithelial barrier and exposes sensitive nerve endings.   This is typically viral but can due to PNDS and  former > latter  applies here.   The point is that once this occurs, it is difficult to eliminate the cycle  using anything but a maximally effective acid suppression regimen at least in the short run, accompanied by an appropriate diet to address non acid GERD and control / eliminate the cough itself with use of flutter, max mucinex, gerd rx and starting gabapentin for parasthesias may help.   Medical decision making was a moderate level of complexity in this case because of > two chronic unresolved  conditions /diagnoses requiring extra time for  H and P, chart review, counseling, teaching smi and generating customized AVS  unique to this office visit and charting. Time spent = 55 min    Each maintenance medication was reviewed in detail including emphasizing most importantly the difference between maintenance and prns and under what circumstances the prns are to be triggered using an action plan format where appropriate. Please see avs for details which were reviewed in writing by both me and my nurse and patient given a written copy highlighted where appropriate with yellow highlighter for the patient's continued care at home along with an updated version of their medications.  Patient was asked to maintain medication reconciliation by comparing this list to the actual medications being used at home and to contact this office right away if there is a conflict or discrepancy.        Samuel Hughs, MD 09/13/2019

## 2019-09-19 ENCOUNTER — Ambulatory Visit: Payer: 59 | Admitting: Orthopaedic Surgery

## 2019-09-27 ENCOUNTER — Ambulatory Visit: Payer: 59

## 2019-10-11 DIAGNOSIS — Z8672 Personal history of thrombophlebitis: Secondary | ICD-10-CM | POA: Insufficient documentation

## 2019-10-12 ENCOUNTER — Ambulatory Visit (HOSPITAL_COMMUNITY)
Admission: RE | Admit: 2019-10-12 | Discharge: 2019-10-12 | Disposition: A | Discharge status: Home / Self Care | Payer: 59 | Source: Ambulatory Visit | Attending: Internal Medicine | Admitting: Internal Medicine

## 2019-10-12 ENCOUNTER — Encounter (HOSPITAL_COMMUNITY): Payer: Self-pay | Admitting: Internal Medicine

## 2019-10-12 ENCOUNTER — Other Ambulatory Visit: Payer: Self-pay

## 2019-10-12 VITALS — BP 110/72 | HR 74 | Ht 72.0 in | Wt 205.4 lb

## 2019-10-12 DIAGNOSIS — J9611 Chronic respiratory failure with hypoxia: Secondary | ICD-10-CM | POA: Diagnosis present

## 2019-10-12 DIAGNOSIS — Z79899 Other long term (current) drug therapy: Secondary | ICD-10-CM | POA: Insufficient documentation

## 2019-10-12 DIAGNOSIS — K219 Gastro-esophageal reflux disease without esophagitis: Secondary | ICD-10-CM | POA: Insufficient documentation

## 2019-10-12 DIAGNOSIS — K449 Diaphragmatic hernia without obstruction or gangrene: Secondary | ICD-10-CM | POA: Diagnosis not present

## 2019-10-12 DIAGNOSIS — G629 Polyneuropathy, unspecified: Secondary | ICD-10-CM | POA: Insufficient documentation

## 2019-10-12 DIAGNOSIS — R0683 Snoring: Secondary | ICD-10-CM

## 2019-10-12 DIAGNOSIS — Z7982 Long term (current) use of aspirin: Secondary | ICD-10-CM | POA: Diagnosis not present

## 2019-10-12 DIAGNOSIS — Q263 Partial anomalous pulmonary venous connection: Secondary | ICD-10-CM | POA: Diagnosis not present

## 2019-10-12 DIAGNOSIS — Q262 Total anomalous pulmonary venous connection: Secondary | ICD-10-CM | POA: Diagnosis not present

## 2019-10-12 DIAGNOSIS — U099 Post covid-19 condition, unspecified: Secondary | ICD-10-CM | POA: Insufficient documentation

## 2019-10-12 HISTORY — DX: Abnormal findings on diagnostic imaging of other specified body structures: R93.89

## 2019-10-12 HISTORY — DX: Gastro-esophageal reflux disease without esophagitis: K21.9

## 2019-10-12 NOTE — Patient Instructions (Signed)
Please call our office in October 2022 to schedule your follow up appointment with an echocardiogram  Your physician has requested that you have an echocardiogram. Echocardiography is a painless test that uses sound waves to create images of your heart. It provides your doctor with information about the size and shape of your heart and how well your heart's chambers and valves are working. This procedure takes approximately one hour. There are no restrictions for this procedure.   If you have any questions or concerns before your next appointment please send [place] a message through [place] or call our office at [phone].    TO LEAVE A MESSAGE FOR THE NURSE SELECT OPTION 2, PLEASE LEAVE A MESSAGE INCLUDING: . YOUR NAME . DATE OF BIRTH . CALL BACK NUMBER . REASON FOR CALL**this is important as we prioritize the call backs  YOU WILL RECEIVE A CALL BACK THE SAME DAY AS LONG AS YOU CALL BEFORE 4:00 PM  At the [facility], you and your health needs are our priority. As part of our continuing mission to provide you with exceptional heart care, we have created designated Provider Care Teams. These Care Teams include your primary Cardiologist (physician) and Advanced Practice Providers (APPs- Physician Assistants and Nurse Practitioners) who all work together to provide you with the care you need, when you need it.   You may see any of the following providers on your designated Care Team at your next follow up: Marland Kitchen Dr Arvilla Meres . Dr Marca Ancona . Tonye Becket, NP . Robbie Lis, PA . Karle Plumber, PharmD   Please be sure to bring in all your medications bottles to every appointment.

## 2019-10-12 NOTE — Addendum Note (Signed)
Encounter addended by: Samara Snide, RN on: 10/12/2019 10:28 AM  Actions taken: Order list changed, Diagnosis association updated, Clinical Note Signed

## 2019-10-12 NOTE — Consult Note (Signed)
[facility] CONSULT NOTE  Referring Physician: Dr. Sherene Sires Primary Care: Primary Cardiologist: New  HPI:  Samuel Ishler Voncannonis a 54 y.o.malewithno significant past medical history who was admitted in 8/21 with respiratory failure due to severe COVID PNA. Hospitalized for 18 days.   As part of the work-up, incidentally found to have left upper lobe pulmonary vein draining into left brachiocephalic vein on CT chest.   1. No evidence of pulmonary embolus. 2. Multifocal bilateral ground-glass airspace disease, greatest in the mid and lower lung zones, consistent with multifocal pneumonia and history of COVID-19. 3. 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 4. Small hiatal hernia.  Echo EF 60-65%. RV normal (not dilated)   Was very active playing sports in HS with basketball and baseball without any problems. Played adult softball without problem. Still gets SOB since having COVID with desats to 85-86% (wife is his nurse). Still with some post-COVID neuropathy. Wife thinks he has sleep apnea. Snores some.     Review of Systems: [y] = yes, [ ]  = no   General: Weight gain [ ] ; Weight loss [ ] ; Anorexia [ ] ; Fatigue [ ] ; Fever [ ] ; Chills [ ] ; Weakness [ ]   Cardiac: Chest pain/pressure [ ] ; Resting SOB [ ] ; Exertional SOB ]; Orthopnea [ ] ; Pedal Edema [ ] ; Palpitations [ ] ; Syncope [ ] ; Presyncope [ ] ; Paroxysmal nocturnal dyspnea[ ]   Pulmonary: Cough [ ] ; Wheezing[ ] ; Hemoptysis[ ] ; Sputum [ ] ; Snoring ]  GI: Vomiting[ ] ; Dysphagia[ ] ; Melena[ ] ; Hematochezia [ ] ; Heartburn[ ] ; Abdominal pain [ ] ; Constipation [ ] ; Diarrhea [ ] ; BRBPR [ ]   GU: Hematuria[ ] ; Dysuria [ ] ; Nocturia[ ]   Vascular: Pain in legs with walking [ ] ; Pain in feet with lying flat [ ] ; Non-healing sores [ ] ; Stroke [ ] ; TIA [ ] ; Slurred speech [ ] ;  Neuro: Headaches[ ] ; Vertigo[ ] ; Seizures[ ] ;  Paresthesias[ ] ;Blurred vision [ ] ; Diplopia [ ] ; Vision changes [ ]   Ortho/Skin: Arthritis [ ] ; Joint pain [ ] ; Muscle pain [ ] ; Joint swelling [ ] ; Back Pain [ ] ; Rash [ ]  Neuropathy [y] Psych: Depression[ ] ; Anxiety[ ]   Heme: Bleeding problems [ ] ; Clotting disorders [ ] ; Anemia [ ]   Endocrine: Diabetes [ ] ; Thyroid dysfunction[ ]    Past Medical History:  Diagnosis Date  . Abnormal computed tomography angiography (CTA)   . GERD (gastroesophageal reflux disease)     Current Outpatient Medications  Medication Sig Dispense Refill  . aspirin EC 81 MG tablet Take 81 mg by mouth daily. Swallow whole.    . cholecalciferol (VITAMIN D3) 25 MCG (1000 UNIT) tablet Take 1,000 Units by mouth daily.    . clonazePAM (KLONOPIN) 1 MG tablet Take 0.5 tablets (0.5 mg total) by mouth daily. 5 tablet 0  . escitalopram (LEXAPRO) 5 MG tablet Take 5 mg by mouth daily.    . famotidine (PEPCID) 20 MG tablet Take 20 mg by mouth daily.    . furosemide (LASIX) 20 MG tablet Take by mouth.    . gabapentin (NEURONTIN) 300 MG capsule Take 1 capsule by mouth at bedtime.    . Ipratropium-Albuterol (COMBIVENT RESPIMAT) 20-100 MCG/ACT AERS respimat Inhale 1 puff into the lungs daily.    omeprazole (PRILOSEC) 20 MG capsule Take 20 mg by mouth daily.    . vitamin B-12 (CYANOCOBALAMIN) 1000 MCG tablet Take 1,000 mcg by mouth daily.  No current facility-administered medications for this encounter.    Allergies  Allergen Reactions  . Milk-Related Compounds   . Xarelto [Rivaroxaban]       Social History   Socioeconomic History  . Marital status: Married    Spouse name: Not on file  . Number of children: Not on file  . Years of education: Not on file  . Highest education level: Not on file  Occupational History  . Not on file  Tobacco Use  . Smoking status: Never Smoker  . Smokeless tobacco: Never Used  Vaping Use  . Vaping Use: Never used  Substance and Sexual Activity  . Alcohol use: Not Currently    . Drug use: Not Currently  . Sexual activity: Yes    Birth control/protection: None  Other Topics Concern  . Not on file  Social History Narrative  . Not on file   Social Determinants of Health   Financial Resource Strain:   . Difficulty of Paying Living Expenses: Not on file  Food Insecurity:   . Worried About Programme researcher, broadcasting/film/video in the Last Year: Not on file  . Ran Out of Food in the Last Year: Not on file  Transportation Needs:   . Lack of Transportation (Medical): Not on file  . Lack of Transportation (Non-Medical): Not on file  Physical Activity:   . Days of Exercise per Week: Not on file  . Minutes of Exercise per Session: Not on file  Stress:   . Feeling of Stress : Not on file  Social Connections:   . Frequency of Communication with Friends and Family: Not on file  . Frequency of Social Gatherings with Friends and Family: Not on file  . Attends Religious Services: Not on file  . Active Member of Clubs or Organizations: Not on file  . Attends Banker Meetings: Not on file  . Marital Status: Not on file  Intimate Partner Violence:   . Fear of Current or Ex-Partner: Not on file  . Emotionally Abused: Not on file  . Physically Abused: Not on file  . Sexually Abused: Not on file      Family History  Problem Relation Age of Onset  . Lung cancer Father     Vitals:   10/12/19 0936  BP: 110/72  Pulse: 74  SpO2: 94%  Weight: 93.2 kg (205 lb 6.4 oz)  Height: 6' (1.829 m)    PHYSICAL EXAM: General:  Well appearing. No respiratory difficulty HEENT: normal Neck: supple. no JVD. Carotids 2+ bilat; no bruits. No lymphadenopathy or thryomegaly appreciated. Cor: PMI nondisplaced. Regular rate & rhythm. No rubs, gallops or murmurs. Lungs: clear Abdomen: soft, nontender, nondistended. No hepatosplenomegaly. No bruits or masses. Good bowel sounds. Extremities: no cyanosis, clubbing, rash, edema Neuro: alert & oriented x 3, cranial nerves grossly intact.  moves all 4 extremities w/o difficulty. Affect pleasant.  ECG: NSR 73 RSR' in V1&V2 No ST-T wave abnormalities. Personally reviewed   ASSESSMENT & PLAN:  1.  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 We discussed further evaluation with cardiac MRI/MRA +/- RHC. He has chosen to defer this testing for now due to claustrophobia.  - recheck echo in 1 year to make sure R-side stable  2. 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 Snoring - possible OSA - consider home sleep study when recovers from post-COVID syndrome   Arvilla Meres, MD  10:02 AM

## 2019-10-24 ENCOUNTER — Other Ambulatory Visit: Payer: Self-pay

## 2019-10-24 ENCOUNTER — Ambulatory Visit (INDEPENDENT_AMBULATORY_CARE_PROVIDER_SITE_OTHER): Payer: 59

## 2019-10-24 ENCOUNTER — Ambulatory Visit (INDEPENDENT_AMBULATORY_CARE_PROVIDER_SITE_OTHER): Payer: 59 | Admitting: Internal Medicine

## 2019-10-24 ENCOUNTER — Encounter: Payer: Self-pay | Admitting: Internal Medicine

## 2019-10-24 DIAGNOSIS — J069 Acute upper respiratory infection, unspecified: Secondary | ICD-10-CM | POA: Diagnosis not present

## 2019-10-24 DIAGNOSIS — U071 COVID-19: Secondary | ICD-10-CM

## 2019-10-24 DIAGNOSIS — J9611 Chronic respiratory failure with hypoxia: Secondary | ICD-10-CM

## 2019-10-24 NOTE — Patient Instructions (Signed)
Ok to check your IgG  At 3 months to see if your immunity is waning   Please remember to go to the  x-ray department  for your tests - we will call you with the results when they are available     To get the most out of exercise, you need to be continuously aware that you are short of breath, but never out of breath, for 30 minutes daily. As you improve, it will actually be easier for you to do the same amount of exercise  in  30 minutes so always push to the level where you are short of breath.    Make sure you check your oxygen saturations at highest level of activity to be sure it stays over 90% and keep track of it at least once a week, more often if breathing getting worse, and let me know if losing ground.    Follow up can be as needed

## 2019-10-24 NOTE — Assessment & Plan Note (Signed)
Onset of symptoms 08/18/19 sp dex/ remdesovir and actemra  - restarted prednisone per PCP 09/10/19 along with gabapentin for parasthesias  - 09/13/2019  After extensive coaching inhaler device,  effectiveness =    0% > 75% > continue combivent 4 h prn  - 10/24/2019 still reports desats with steps/hills but declined to continue 02 rx  Doing really well considering only 2 m out from severe covid 19 pna, presumably with delta variant   rec Make sure you check your oxygen saturations at highest level of activity to be sure it stays over 90% and adjust  02 flow upward to maintain this level if needed but remember to turn it back to previous settings when you stop (to conserve your supply).   At 3 months rec either check IgG to be sure still has significant Antibody or strongly consider pfizer vaccination - I favor the latter based on present guidelines and note his wife is a nurse who very easily could get re-infected and bring it home.   Pulmonary f/u can be prn

## 2019-10-24 NOTE — Assessment & Plan Note (Signed)
As dc/ from Covid admit 08/2019:  3lpm hs, titrate daytime to keep sats >90% at all times   >>> no longer using any 02 > ok to d/c it          Each maintenance medication was reviewed in detail including emphasizing most importantly the difference between maintenance and prns and under what circumstances the prns are to be triggered using an action plan format where appropriate.  Total time for H and P, chart review, counseling, teaching device and generating customized AVS unique to this office visit / charting = 23 min

## 2019-10-24 NOTE — Progress Notes (Signed)
Samuel [name], male    DOB: 04/28/1965,    MRN: [id number]   Brief patient profile:   Admission date:  08/24/2019  Admitting Physician  Eduard Clos, MD  Discharge Date:  09/09/2019      Recommendations for primary care physician for things to follow:  -Please check CBC, CMP during next visit. 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 Gala Romney as an outpatient. -on Klonipin  during hospital stay, this is to be tapered as an outpatient   Admission Diagnosis  Hypoxia [R09.02] Pneumonia due to COVID-19 virus [U07.1, J12.82] Acute respiratory disease due to COVID-19 virus [U07.1, J06.9]   Discharge Diagnosis  Hypoxia [R09.02] Pneumonia due to COVID-19 virus [U07.1, J12.82] Acute respiratory disease due to COVID-19 virus [U07.1, J06.9]    Principal Problem:   Acute respiratory disease due to COVID-19 virus         History of present illness and  Hospital Course:     Kindly see H&P for history of present illness and admission details, please review complete Labs, Consult reports and Test reports for all details in brief  HPI  from the history and physical done on the day of admission 08/24/2019  LPF:[id number] Samuel [name] a 54 y.o.malewithno significant past medical history presents to the ER at bedside at [facility] with complaint of shortness of breath. Patient started having upper respiratory tract-like symptoms and headache about a week PTA and was diagnosed with COVID-19 infection. Patient was treating himself symptomatically. Patient became progressively short of breath and patient presented to the ER admits in [facility] on August 12.  ED Course:In the ER patient is found to be hypoxic with chest x-ray showing bilateral infiltrates. Patient was started on  remdesivir Decadron admitted for further management of COVID-19 pneumonia.    Hospital Course   Acute Hypoxic Resp. Failure due to Acute Covid 19 Viral Pneumonitis during the ongoing 2020 Covid 19 Pandemic- - He has severe disease and is unfortunately unvaccinated. -Treated with IV steroids, he was treated with IV Solu-Medrol for severe disease, his steroids has been tapered during hospital stay, -Treated with IV remdesivir. -He received Actemra on admission. - Echocardiogram was non acute &- ve CTA of the chest.  -Patient with significant hypoxia during hospital stay, at one point during heated high flow nasal cannula, this has improved, he will be discharged on 3 L oxygen at rest and with activity. -Inflammatory marker has normalized, CRP less than 0.05, D-dimer is 0.5,.  Productive cough developed 08/31/2019.  - Question mild superimposed bacterial bronchitis or pneumonia. Procalcitonin stable, treated with levofloxacin .  Rising LFTs.  -Due to Covid pneumonia, remdesivir and Actemra, it is with mild fluctuation, but overall is stable.Marland Kitchen  GERD- PPI  Severe anxiety.  He was kept on Klonopin during hospital stay, as well on Lexapro, this is to be tapered on discharge, will be discharged on 0.5 mg p.o. daily for next 10 days, to be followed by PCP if he needs further tapering.  Mild hyponatremia- SIADH improved with Lasix.  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 Right atrium and right ventricular pressures stable, Dr Thedore Mins  discussed with cardiologist Dr. Jones Broom. Outpatient follow-up.         History of Present Illness  09/13/2019  Pulmonary/ 1st office eval/Samuel [name]  covid Pna / resp failure first symptoms  08/18/19  Chief Complaint  Patient presents with  . Pulmonary Consult    dx with covid 08/23/19- started on o2 24/7 2-4lpm. He states he feels that his breathing is improving. He states he has some sinus congestion.     never smoker with spring time rhinitis / otc's no wheezing  Dyspnea:  Slow improvement still 02 dep  Cough: dry Narda Bonds talking  Sleep: 45 degrees on side   02  At rest 92% on RA,  On 02 2lpm daytime with activity/ does not titrate , hs 3lpm  PCP rx 300 mg gabapentin hs  combivent prn (note can't use smi at baseline) thinks it helps but only useses p ex  rec Make sure you check your oxygen saturations at highest level of activity  For cough/wheezing > combivent one puff every 4 hours as needed  Prilosec 20 mg Take 30- 60 min before your first and last meals of the day  Mucinex dm 1200 mg  Every 12 hours as needed    10/24/2019  f/u ov/Samuel [name] re:  Chief Complaint  Patient presents with  . Follow-up    Post Covid follow up  Dyspnea:  Still desats on steps/hills down to 87% / no problem with flat surfaces Cough: no  Sleeping: slt elevation SABA use: no need for combivent / flovent  02: still has not using    No obvious day to day or daytime variability or assoc excess/ purulent sputum or mucus plugs or hemoptysis or cp or chest tightness, subjective wheeze or overt sinus or hb symptoms.   Sleeping without nocturnal  or early am exacerbation  of respiratory  c/o's or need for noct saba. Also denies any obvious fluctuation of symptoms with weather or environmental changes or other aggravating or alleviating factors except as outlined above   No unusual exposure hx or h/o childhood pna/ asthma or knowledge of premature birth.  Current Allergies, Complete Past Medical History, Past Surgical History, Family History, and Social History were reviewed in [employer] record.  ROS  The following are not active complaints unless bolded Hoarseness, sore throat, dysphagia, dental problems, itching, sneezing,  nasal congestion or discharge of excess mucus or purulent secretions, ear ache,   fever, chills, sweats, unintended wt loss or wt gain, classically pleuritic or exertional  cp,  orthopnea pnd or arm/hand swelling  or leg swelling, presyncope, palpitations, abdominal pain, anorexia, nausea, vomiting, diarrhea  or change in bowel habits or change in bladder habits, change in stools or change in urine, dysuria, hematuria,  rash, arthralgias, visual complaints, headache, numbness, weakness or ataxia or problems with walking or coordination,  change in mood or  memory.        Current Meds  Medication Sig  . aspirin EC 81 MG tablet Take 81 mg by mouth daily. Swallow whole.  . cholecalciferol (VITAMIN D3) 25 MCG (1000 UNIT) tablet Take 1,000 Units by mouth daily.  . clonazePAM (KLONOPIN) 1 MG tablet Take 0.5 tablets (0.5 mg total) by mouth daily.  Marland Kitchen escitalopram (LEXAPRO) 5 MG tablet Take 5 mg by mouth daily.  . famotidine (PEPCID) 20 MG tablet Take 20 mg by mouth daily.  . furosemide (LASIX) 20 MG tablet Take by mouth.  . gabapentin (NEURONTIN) 300 MG capsule Take 1 capsule by mouth at bedtime.  . Ipratropium-Albuterol (COMBIVENT RESPIMAT) 20-100 MCG/ACT AERS respimat Inhale 1 puff into the lungs daily.  Marland Kitchen omeprazole (PRILOSEC) 20 MG capsule Take 20 mg by mouth daily.  . vitamin  B-12 (CYANOCOBALAMIN) 1000 MCG tablet Take 1,000 mcg by mouth daily.                      Objective:     Wt Readings from Last 3 Encounters:  10/24/19 209 lb 9.6 oz (95.1 kg)  10/12/19 205 lb 6.4 oz (93.2 kg)  09/13/19 192 lb (87.1 kg)     Vital signs reviewed - Note on arrival 10/24/2019  02 sats  96% on RA      HEENT : pt wearing mask not removed for exam due to covid -19 concerns.    NECK :  without JVD/Nodes/TM/ nl carotid upstrokes bilaterally   LUNGS: no acc muscle use,  Nl contour chest which is clear to A and P bilaterally without cough on insp or exp maneuvers   CV:  RRR  no s3 or murmur or increase in P2, and no edema   ABD:  soft and nontender with nl inspiratory excursion in the supine position. No bruits or organomegaly appreciated, bowel sounds nl  MS:   Nl gait/ ext warm without deformities, calf tenderness, cyanosis or clubbing No obvious joint restrictions   SKIN: warm and dry without lesions    NEURO:  alert, approp, nl sensorium with  no motor or cerebellar deficits apparent.     CXR PA and Lateral:   10/24/2019 :    I personally reviewed images and   impression as follows:   Residual increased markings in bases but marked serial improvement vs priors            Assessment   Outpatient Encounter Medications as of 10/24/2019  Medication Sig  . aspirin EC 81 MG tablet Take 81 mg by mouth daily. Swallow whole.  . cholecalciferol (VITAMIN D3) 25 MCG (1000 UNIT) tablet Take 1,000 Units by mouth daily.  . clonazePAM (KLONOPIN) 1 MG tablet Take 0.5 tablets (0.5 mg total) by mouth daily.  Marland Kitchen escitalopram (LEXAPRO) 5 MG tablet Take 5 mg by mouth daily.  . famotidine (PEPCID) 20 MG tablet Take 20 mg by mouth daily.  . furosemide (LASIX) 20 MG tablet Take by mouth.  . gabapentin (NEURONTIN) 300 MG capsule Take 1 capsule by mouth at bedtime.  Marland Kitchen omeprazole (PRILOSEC) 20 MG capsule Take 20 mg by mouth daily.  . vitamin B-12 (CYANOCOBALAMIN) 1000 MCG tablet Take 1,000 mcg by mouth daily.  . [DISCONTINUED] Ipratropium-Albuterol (COMBIVENT RESPIMAT) 20-100 MCG/ACT AERS respimat Inhale 1 puff into the lungs daily.   No facility-administered encounter medications on file as of 10/24/2019.

## 2019-10-26 ENCOUNTER — Ambulatory Visit: Payer: 59 | Admitting: Internal Medicine

## 2019-10-29 NOTE — Progress Notes (Signed)
Attempted to call patient, no answer and unable to leave a message due to voicemail being full. Will try again at another time.

## 2019-10-30 ENCOUNTER — Encounter: Payer: Self-pay | Admitting: *Deleted

## 2019-10-30 NOTE — Progress Notes (Signed)
Tried calling the pt- still no answer so mailed letter to call

## 2020-01-02 DIAGNOSIS — K219 Gastro-esophageal reflux disease without esophagitis: Secondary | ICD-10-CM

## 2020-07-27 DIAGNOSIS — R5381 Other malaise: Secondary | ICD-10-CM | POA: Insufficient documentation

## 2020-09-11 ENCOUNTER — Other Ambulatory Visit (HOSPITAL_COMMUNITY): Payer: Self-pay | Admitting: *Deleted

## 2020-09-11 DIAGNOSIS — Q262 Total anomalous pulmonary venous connection: Secondary | ICD-10-CM

## 2020-11-21 ENCOUNTER — Ambulatory Visit (HOSPITAL_COMMUNITY)
Admission: RE | Admit: 2020-11-21 | Discharge: 2020-11-21 | Disposition: A | Discharge status: Home / Self Care | Payer: 59 | Source: Ambulatory Visit | Attending: Internal Medicine | Admitting: Internal Medicine

## 2020-11-21 ENCOUNTER — Encounter (HOSPITAL_COMMUNITY): Payer: Self-pay | Admitting: Internal Medicine

## 2020-11-21 ENCOUNTER — Ambulatory Visit (HOSPITAL_BASED_OUTPATIENT_CLINIC_OR_DEPARTMENT_OTHER)
Admission: RE | Admit: 2020-11-21 | Discharge: 2020-11-21 | Disposition: A | Discharge status: Home / Self Care | Payer: 59 | Source: Ambulatory Visit | Attending: Internal Medicine | Admitting: Internal Medicine

## 2020-11-21 ENCOUNTER — Other Ambulatory Visit: Payer: Self-pay

## 2020-11-21 ENCOUNTER — Other Ambulatory Visit (HOSPITAL_COMMUNITY): Payer: Self-pay | Admitting: Internal Medicine

## 2020-11-21 VITALS — BP 138/90 | HR 56 | Ht 72.0 in | Wt 215.2 lb

## 2020-11-21 DIAGNOSIS — R55 Syncope and collapse: Secondary | ICD-10-CM | POA: Diagnosis not present

## 2020-11-21 DIAGNOSIS — R0902 Hypoxemia: Secondary | ICD-10-CM | POA: Diagnosis not present

## 2020-11-21 DIAGNOSIS — R0683 Snoring: Secondary | ICD-10-CM | POA: Insufficient documentation

## 2020-11-21 DIAGNOSIS — R030 Elevated blood-pressure reading, without diagnosis of hypertension: Secondary | ICD-10-CM

## 2020-11-21 DIAGNOSIS — I509 Heart failure, unspecified: Secondary | ICD-10-CM | POA: Diagnosis not present

## 2020-11-21 DIAGNOSIS — Q262 Total anomalous pulmonary venous connection: Secondary | ICD-10-CM | POA: Insufficient documentation

## 2020-11-21 DIAGNOSIS — R42 Dizziness and giddiness: Secondary | ICD-10-CM | POA: Insufficient documentation

## 2020-11-21 DIAGNOSIS — Z09 Encounter for follow-up examination after completed treatment for conditions other than malignant neoplasm: Secondary | ICD-10-CM | POA: Insufficient documentation

## 2020-11-21 DIAGNOSIS — R001 Bradycardia, unspecified: Secondary | ICD-10-CM | POA: Insufficient documentation

## 2020-11-21 DIAGNOSIS — Z8616 Personal history of COVID-19: Secondary | ICD-10-CM | POA: Insufficient documentation

## 2020-11-21 DIAGNOSIS — K449 Diaphragmatic hernia without obstruction or gangrene: Secondary | ICD-10-CM | POA: Insufficient documentation

## 2020-11-21 LAB — ECHOCARDIOGRAM COMPLETE
Area-P 1/2: 4.21 cm2
Calc EF: 54.2 %
S' Lateral: 2.8 cm
Single Plane A2C EF: 58.5 %
Single Plane A4C EF: 49.1 %

## 2020-11-21 NOTE — Patient Instructions (Signed)
Please wear your compression hose daily, place them on as soon as you get up in the morning and remove before you go to bed at night.  Your provider has recommended that  you wear a Zio Patch for 14 days.  This monitor will record your heart rhythm for our review.  IF you have any symptoms while wearing the monitor please press the button.  If you have any issues with the patch or you notice a red or orange light on it please call the company at [phone].  Once you remove the patch please mail it back to the company as soon as possible so we can get the results.  Your provider has recommended that you have a home sleep study.  We have provided you with the equipment in our office today. Please download the app and follow the instructions. YOUR PIN NUMBER IS: [id number]. Once you have completed the test you just dispose of the equipment, the information is automatically uploaded to [place] via blue-tooth technology. If your test is positive for sleep apnea and you need a home CPAP machine you will be contacted by Dr Norris Cross office [facility]) to set this up.  Your physician recommends that you schedule a follow-up appointment in: 1 year (Nov 2023), **PLEASE CALL OUR OFFICE IN September TO SCHEDULE THIS APPOINTMENT  If you have any questions or concerns before your next appointment please send [place] a message through [place] or call our office at [phone].    TO LEAVE A MESSAGE FOR THE NURSE SELECT OPTION 2, PLEASE LEAVE A MESSAGE INCLUDING: YOUR NAME DATE OF BIRTH CALL BACK NUMBER REASON FOR CALL**this is important as we prioritize the call backs  YOU WILL RECEIVE A CALL BACK THE SAME DAY AS LONG AS YOU CALL BEFORE 4:00 PM  At the [facility], you and your health needs are our priority. As part of our continuing mission to provide you with exceptional heart care, we have created designated Provider Care Teams. These Care Teams include your primary Cardiologist (physician) and  Advanced Practice Providers (APPs- Physician Assistants and Nurse Practitioners) who all work together to provide you with the care you need, when you need it.   You may see any of the following providers on your designated Care Team at your next follow up: Dr Arvilla Meres Dr Carron Curie, NP Robbie Lis, [place] [facility] [place], [place] Karle Plumber, PharmD   Please be sure to bring in all your medications bottles to every appointment.

## 2020-11-21 NOTE — Progress Notes (Addendum)
ADVANCED HF CLINIC NOTE  Referring: Dr. Sherene Sires HF Cardiologist: Dr. Gala Romney  HPI:  Samuel [name] is a 55 y.o. male with no significant past medical history who was admitted in 8/21 with respiratory failure due to severe COVID PNA. Hospitalized for 18 days.    As part of the work-up, incidentally found to have left upper lobe pulmonary vein draining into left brachiocephalic vein on CT chest.    1. No evidence of pulmonary embolus. 2. Multifocal bilateral ground-glass airspace disease, greatest in the mid and lower lung zones, consistent with multifocal pneumonia and history of COVID-19. 3. 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 4. Small hiatal hernia.   Echo EF 60-65%. RV normal (not dilated)    Was very active playing sports in HS with basketball and baseball without any problems. Played adult softball without problem.  Saw Dr. Gala Romney for initial evaluation in 10/2019. 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 Cardiac MRI and RHC discussed to further evaluate. Deferred d/t claustrophobia.   He is here today for 1 year f/u with echo. Reports significant improvement in dyspnea over last year. Off supplemental O2. No longer feels hindered by shortness of breath, joint pains more of a limiting factor. Stands on his feet most of the day at work and walks quite a bit. Does notice and indent around his sock line especially after prolonged standing. Does not wear compression stockings.   Had several episodes of dizziness/presyncope over summer months when very warm outdoors and once about 1.5 weeks ago at work. Episodes tend to occur while standing or with activity. Most recent episode while working with furniture at work. Right shoulder was very sore. Became nauseated, diaphoretic and pale. Had to sit down. Notes history of hypotension.  Has quite a bit of daytime  fatigue. Wife reports snoring and sometimes appears to stop breathing in his sleep.  Preliminary review of echo today: EF 55%, RV normal   ROS: All systems negative except as listed in HPI, PMH and Problem List.  SH:  Social History   Socioeconomic History   Marital status: Married    Spouse name: Not on file   Number of children: Not on file   Years of education: Not on file   Highest education level: Not on file  Occupational History   Not on file  Tobacco Use   Smoking status: Never   Smokeless tobacco: Never  Vaping Use   Vaping Use: Never used  Substance and Sexual Activity   Alcohol use: Not Currently   Drug use: Not Currently   Sexual activity: Yes    Birth control/protection: None  Other Topics Concern   Not on file  Social History Narrative   Not on file   Social Determinants of Health   Financial Resource Strain: Not on file  Food Insecurity: Not on file  Transportation Needs: Not on file  Physical Activity: Not on file  Stress: Not on file  Social Connections: Not on file  Intimate Partner Violence: Not on file    FH:  Family History  Problem Relation Age of Onset   Lung cancer Father     Past Medical History:  Diagnosis Date   Abnormal computed tomography angiography (CTA)    GERD (gastroesophageal reflux disease)     Current Outpatient Medications  Medication Sig Dispense Refill   aspirin EC 81 MG tablet Take 81 mg by mouth daily. Swallow whole.  clonazePAM (KLONOPIN) 1 MG tablet Take 0.5 tablets (0.5 mg total) by mouth daily. 5 tablet 0   escitalopram (LEXAPRO) 5 MG tablet Take 5 mg by mouth daily.     furosemide (LASIX) 20 MG tablet Take 20 mg by mouth as needed.     gabapentin (NEURONTIN) 300 MG capsule Take 1 capsule by mouth at bedtime.     Multiple Vitamins-Minerals (VITAMIN D3 COMPLETE PO) Take 3,000 Units by mouth daily in the afternoon.     Omega-3 Fatty Acids (FISH OIL) 1000 MG CAPS Take 1 capsule by mouth 2 (two) times daily.      omeprazole (PRILOSEC) 20 MG capsule Take 20 mg by mouth daily.     vitamin B-12 (CYANOCOBALAMIN) 1000 MCG tablet Take 1,000 mcg by mouth daily.     No current facility-administered medications for this encounter.    Vitals:   11/21/20 1401  BP: 138/90  Pulse: (!) 56  SpO2: 96%  Weight: 97.6 kg (215 lb 3.2 oz)    PHYSICAL EXAM:  General:  Well appearing. No resp difficulty. Wife present. HEENT: normal Neck: supple. JVP flat. Carotids 2+ bilaterally; no bruits. No lymphadenopathy or thryomegaly appreciated. Cor: PMI normal. Regular rate & rhythm. No rubs, gallops or murmurs. Lungs: clear Abdomen: soft, nontender, nondistended. No hepatosplenomegaly.  Extremities: no cyanosis, clubbing, rash, edema Neuro: alert & orientedx3, cranial nerves grossly intact. Moves all 4 extremities w/o difficulty. Affect pleasant.   ECG: sinus bradycardia with rate of 58 bpm, IRBBB   ASSESSMENT & PLAN:  1.  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 We discussed further evaluation with cardiac MRI/MRA +/- RHC. Previously deferred this testing, declined again today d/t severe claustrophobia - Echo today - EF 55%, RV normal - No evidence of decompensated right sided HF on exam. Has ankle swelling after prolonged standing at work. Recommended compression stockings.   2. 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 No longer needing O2.    3. Snoring/possible apneic episodes - Will refer for sleep evaluation  4. Dizziness/presyncope - Tends to occur with activity. Several times during summer in very warm temperatures. Once recently while at work. Most recent episode sounds vasovagal. - Orthostatics negative today. Wife reports he's had history of hypotension. - Encouraged liberal fluid  intake - ECG with sinus bradycardia, 58 bpm, IRBBB - 14 day zio patch placed today - Recent labs at PCP's office unremarkable  5. Elevated blood pressure - Home readings always controlled, even low at times - Suspect anxiety may be playing a role in today's reading   F/u: 2 years with echo  Anna Genre, PA-C   Patient seen and examined with the above-signed Advanced Practice Provider and/or Housestaff. I personally reviewed laboratory data, imaging studies and relevant notes. I independently examined the patient and formulated the important aspects of the plan. I have edited the note to reflect any of my changes or salient points. I have personally discussed the plan with the patient and/or family.  Overall doing fairly well though he has very easy fatiguability and occasional dizziness/presyncope. No RV failure.  Echo today is normal.   General:  Well appearing. No resp difficulty HEENT: normal Neck: supple. no JVD. Carotids 2+ bilat; no bruits. No lymphadenopathy or thryomegaly appreciated. Cor: PMI nondisplaced. Regular rate & rhythm. No rubs, gallops or  murmurs. Lungs: clear Abdomen: soft, nontender, nondistended. No hepatosplenomegaly. No bruits or masses. Good bowel sounds. Extremities: no cyanosis, clubbing, rash, edema Neuro: alert & orientedx3, cranial nerves grossly intact. moves all 4 extremities w/o difficulty. Affect pleasant  Echo today looks good. No RV dilation. Suspect shunt fraction is not overly high. Wil repeat echo in 2 years. Agree with plan for Zio patch (palpitations/presyncope) and HST (snoring with daytime fatigue).   Arvilla Meres, MD  2:30 PM

## 2020-11-21 NOTE — Progress Notes (Signed)
Height: 6'    Weight: 215 lb BMI: 29.19  Today's Date:  11/21/20  STOP BANG RISK ASSESSMENT S (snore) Have you been told that you snore?     YES   T (tired) Are you often tired, fatigued, or sleepy during the day?   YES  O (obstruction) Do you stop breathing, choke, or gasp during sleep? NO   P (pressure) Do you have or are you being treated for high blood pressure? YES   B (BMI) Is your body index greater than 35 kg/m? NO   A (age) Are you 56 years old or older? YES   N (neck) Do you have a neck circumference greater than 16 inches?      G (gender) Are you a male? YES   TOTAL STOP/BANG "YES" ANSWERS 5                                                                       For Office Use Only              Procedure Order Form    YES to 3+ Stop Bang questions OR two clinical symptoms - patient qualifies for WatchPAT (CPT 95800)      Clinical Notes: Will consult Sleep Specialist and refer for management of therapy due to patient increased risk of Sleep Apnea. Ordering a sleep study due to the following two clinical symptoms: Excessive daytime sleepiness G47.10 / Loud snoring R06.83

## 2020-11-21 NOTE — Progress Notes (Signed)
  Echocardiogram 2D Echocardiogram has been performed.  Samuel [name] 11/21/2020, 2:00 PM

## 2020-11-27 ENCOUNTER — Telehealth (HOSPITAL_COMMUNITY): Payer: Self-pay | Admitting: Surgery

## 2020-11-27 NOTE — Telephone Encounter (Signed)
I attempted to call patient to let him know to proceed with ordered home sleep study as insurance pre cert is not needed.  I left a brief message to indicate this and requested call back if he has questions.

## 2020-12-11 NOTE — Addendum Note (Signed)
Encounter addended by: Crissie Figures, RN on: 12/11/2020 3:19 PM  Actions taken: Imaging Exam ended

## 2020-12-16 ENCOUNTER — Telehealth (HOSPITAL_COMMUNITY): Payer: Self-pay | Admitting: Surgery

## 2020-12-16 NOTE — Telephone Encounter (Signed)
I called to remind patient to perform the ordered home sleep study. I left a message and requested a return call if he has any questions.

## 2020-12-22 ENCOUNTER — Encounter (HOSPITAL_BASED_OUTPATIENT_CLINIC_OR_DEPARTMENT_OTHER): Payer: 59 | Admitting: Cardiology

## 2020-12-22 DIAGNOSIS — G4733 Obstructive sleep apnea (adult) (pediatric): Secondary | ICD-10-CM

## 2020-12-24 ENCOUNTER — Ambulatory Visit: Payer: 59

## 2020-12-24 DIAGNOSIS — R0683 Snoring: Secondary | ICD-10-CM

## 2020-12-24 NOTE — Procedures (Signed)
° °  Sleep Study Report  Patient Information Study Date: 12/22/20 Patient Name: Samuel [name] Patient ID: [id number] Birth Date: 04-05-2065 Age: 55 Gender: Male Referring Physician:Daniel Bensimhon, MD  TEST DESCRIPTION: Home sleep apnea testing was completed using the WatchPat, a Type 1 device, utilizing peripheral arterial tonometry (PAT), chest movement, actigraphy, pulse oximetry, pulse rate, body position and snore. AHI was calculated with apnea and hypopnea using valid sleep time as the denominator. RDI includes apneas, hypopneas, and RERAs. The data acquired and the scoring of sleep and all associated events were performed in accordance with the recommended standards and specifications as outlined in the AASM Manual for the Scoring of Sleep and Associated Events 2.2.0 (2015).  FINDINGS: 1. Mild Obstructive Sleep Apnea with AHI 6.7/hr. 2. No Central Sleep Apnea with pAHIc 0.7/hrhr. 3. Oxygen desaturations as low as 85%. 4. Minimal snoring was present. O2 sats were < 88% for 0.1 min. 5. Total sleep time was 6 hrs and 0 min. 6. 32.9% of total sleep time was spent in REM sleep. 7. Normal sleep onset latency at 22 min. 8. Shortened REM sleep onset latency at 36 min. 9. Total awakenings were 5.  DIAGNOSIS: Mild Obstructive Sleep Apnea (G47.33)  RECOMMENDATIONS: 1. Clinical correlation of these findings is necessary. The decision to treat obstructive sleep apnea (OSA) is usually based on the presence of apnea symptoms or the presence of associated medical conditions such as Hypertension, Congestive Heart Failure, Atrial Fibrillation or Obesity. The most common symptoms of OSA are snoring, gasping for breath while sleeping, daytime sleepiness and fatigue.  2. Initiating apnea therapy is recommended given the presence of symptoms and/or associated conditions. Recommend proceeding with one of the following:   a. Auto-CPAP therapy with a pressure range of 5-20cm H2O.   b. An  oral appliance (OA) that can be obtained from certain dentists with expertise in sleep medicine. These are primarily of use in non-obese patients with mild and moderate disease.   c. An ENT consultation which may be useful to look for specific causes of obstruction and possible treatment options.   d. If patient is intolerant to PAP therapy, consider referral to ENT for evaluation for hypoglossal nerve stimulator.  3. Close follow-up is necessary to ensure success with CPAP or oral appliance therapy for maximum benefit .  4. A follow-up oximetry study on CPAP is recommended to assess the adequacy of therapy and determine the need for supplemental oxygen or the potential need for Bi-level therapy. An arterial blood gas to determine the adequacy of baseline ventilation and oxygenation should also be considered.  5. Healthy sleep recommendations include: adequate nightly sleep (normal 7-9 hrs/night), avoidance of caffeine after noon and alcohol near bedtime, and maintaining a sleep environment that is cool, dark and quiet.  6. Weight loss for overweight patients is recommended. Even modest amounts of weight loss can significantly improve the severity of sleep apnea.  7. Snoring recommendations include: weight loss where appropriate, side sleeping, and avoidance of alcohol before bed.  8. Operation of motor vehicle should be avoided when sleepy.  Signature: Electronically Signed: 12/24/20 Armanda Magic, MD; [facility]; Diplomat, American Board of Sleep Medicine

## 2021-01-21 ENCOUNTER — Telehealth: Payer: Self-pay | Admitting: *Deleted

## 2021-01-21 NOTE — Telephone Encounter (Signed)
Left message to return a call to discuss sleep study results and recommendations. 

## 2021-01-21 NOTE — Telephone Encounter (Signed)
-----   Message from Brittany I Currie, RN sent at 12/31/2020 10:47 AM EST ----- ° °----- Message ----- °From: Turner, Traci R, MD °Sent: 12/24/2020   7:19 AM EST °To: Dorothea G Jones, CMA ° °Please let patient know that they have sleep apnea and recommend treating with CPAP.  Please order an auto CPAP from 4-15cm H2O with heated humidity and mask of choice.  Order overnight pulse ox on CPAP.  Followup with me in 6 weeks.  °  ° °

## 2021-01-30 NOTE — Telephone Encounter (Signed)
-----   Message from Cleon Gustin, [place] sent at 12/31/2020 10:47 AM EST -----  ----- Message ----- From: Sueanne Margarita, MD Sent: 12/24/2020   7:19 AM EST To: Freada Bergeron, CMA  Please let patient know that they have sleep apnea and recommend treating with CPAP.  Please order an auto CPAP from 4-15cm H2O with heated humidity and mask of choice.  Order overnight pulse ox on CPAP.  Followup with me in 6 weeks.

## 2021-01-30 NOTE — Telephone Encounter (Signed)
Patient notified of sleep study results and recommendations. He agrees to proceed with CPAP therapy. He requests for the order to be sent to [facility]  due to him living in [place].

## 2021-06-11 IMAGING — CT CT ANGIO CHEST
2 of 6 series · 18 of 46 positions shown · IV contrast (omnipaque)
Comparison: 08/27/2019

CLINICAL DATA: OXFZL-E0 infection, acute respiratory disease,
concern for pulmonary embolus, short of breath

EXAM:
CT ANGIOGRAPHY CHEST WITH CONTRAST
TECHNIQUE: Multidetector CT imaging of the chest was performed using the
standard protocol during bolus administration of intravenous
contrast. Multiplanar CT image reconstructions and MIPs were
obtained to evaluate the vascular anatomy.
CONTRAST:  80mL OMNIPAQUE IOHEXOL 350 MG/ML SOLN

[Series 6: thins · axial · 0.73mm/px · z∈[+1210,+1446]mm · 15 of 259 slices shown]
[im 12/259  lung]
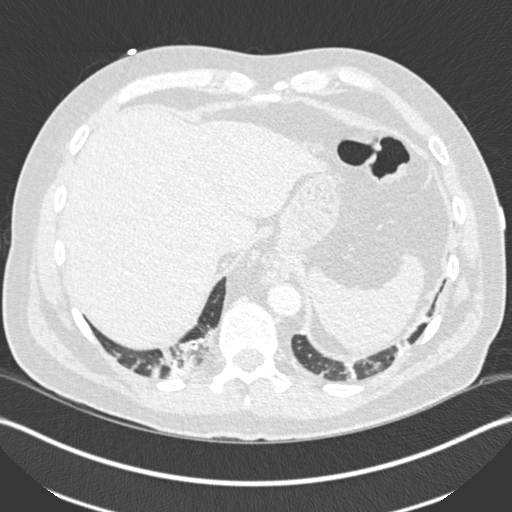
[im 34/259  soft-tissue]
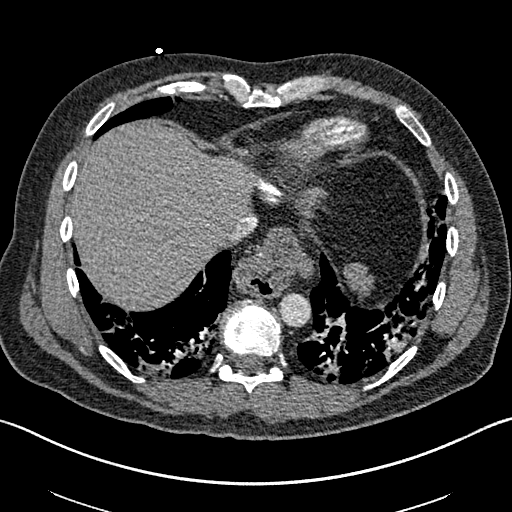
[im 45/259  lung]
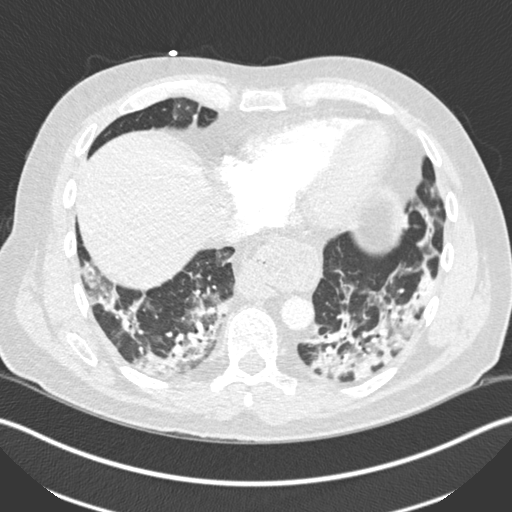
[im 68/259  soft-tissue]
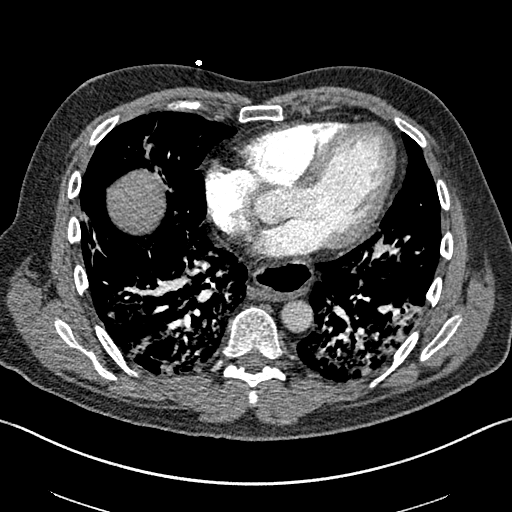
[im 79/259  lung]
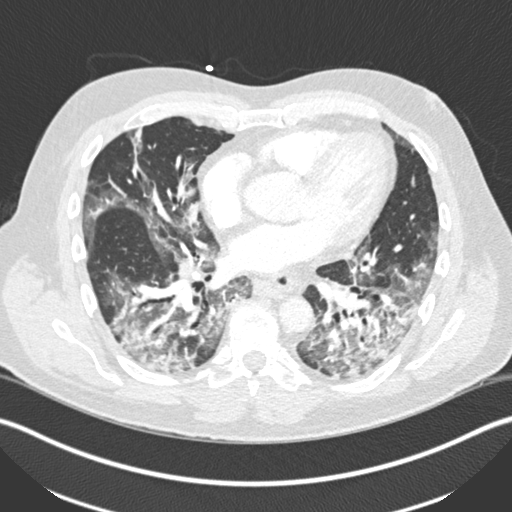
[im 101/259  soft-tissue]
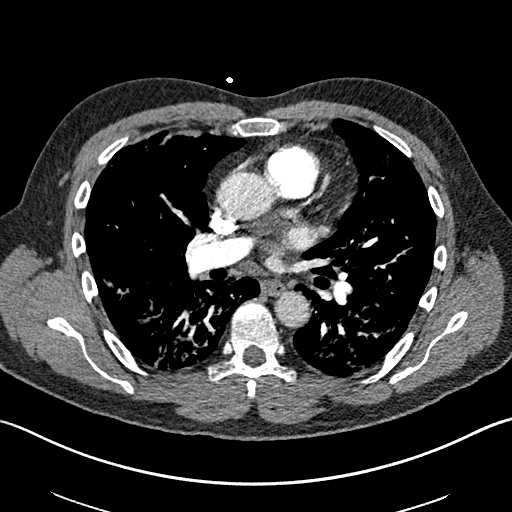
[im 113/259  lung]
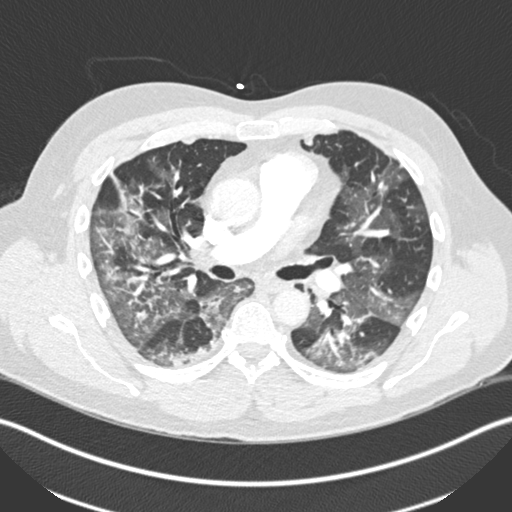
[im 135/259  soft-tissue]
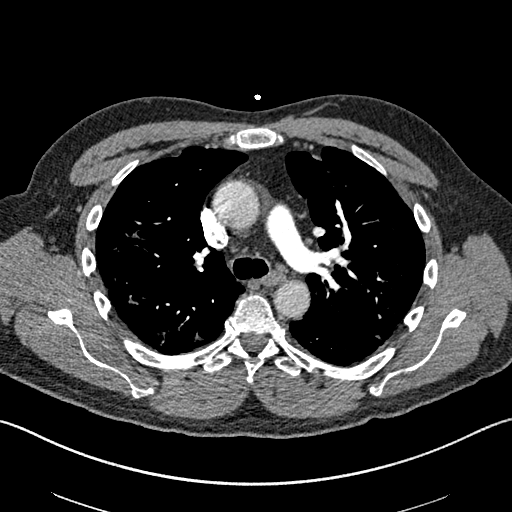
[im 146/259  lung]
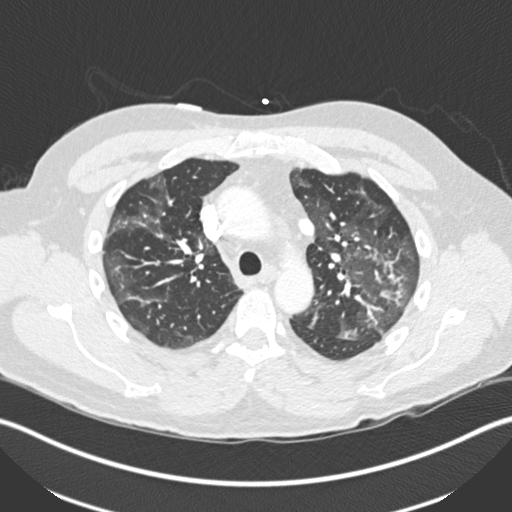
[im 158/259  soft-tissue]
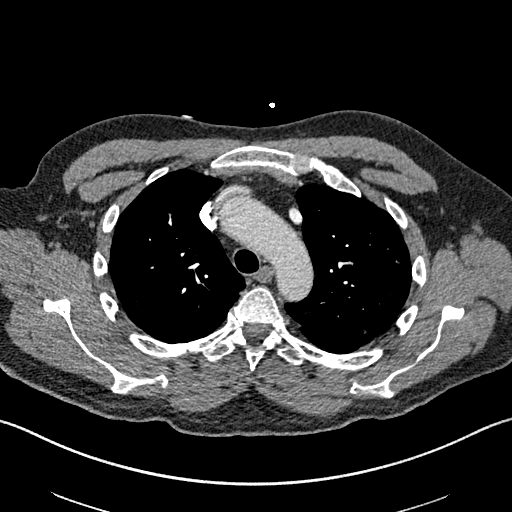
[im 180/259  lung]
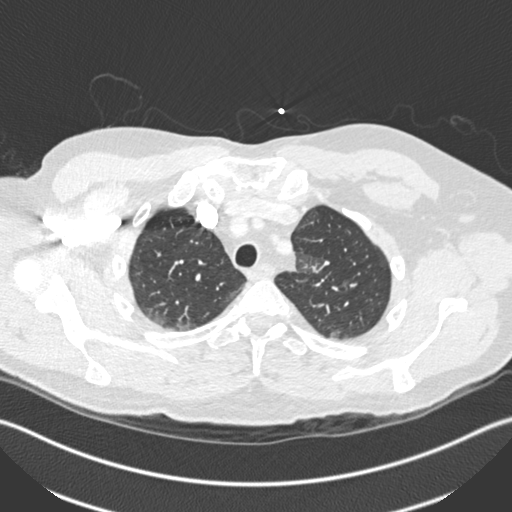
[im 191/259  soft-tissue]
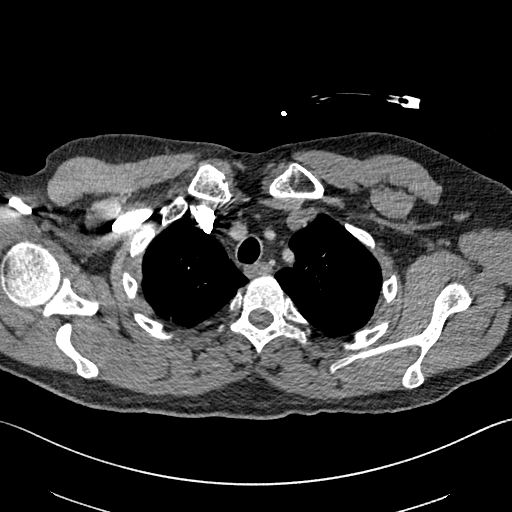
[im 214/259  lung]
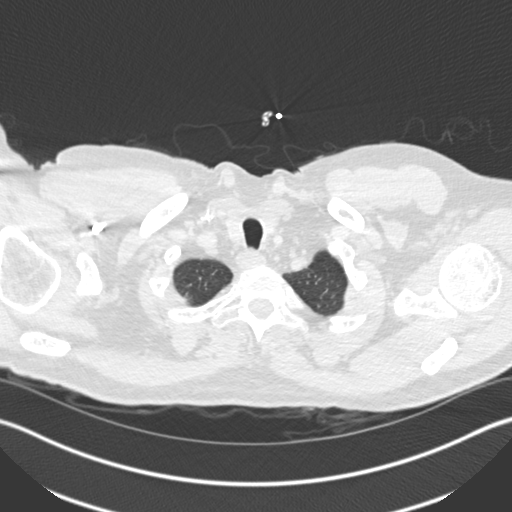
[im 225/259  soft-tissue]
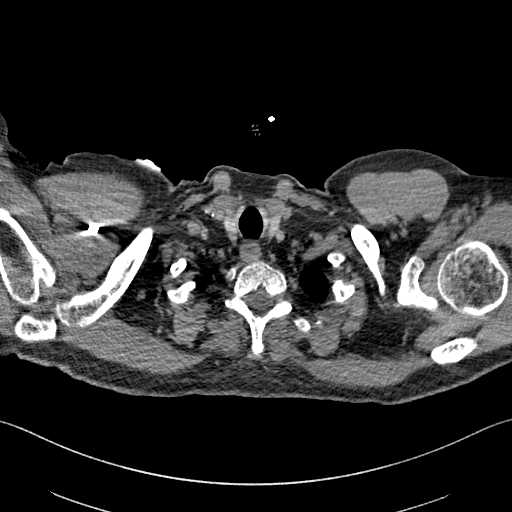
[im 247/259  lung]
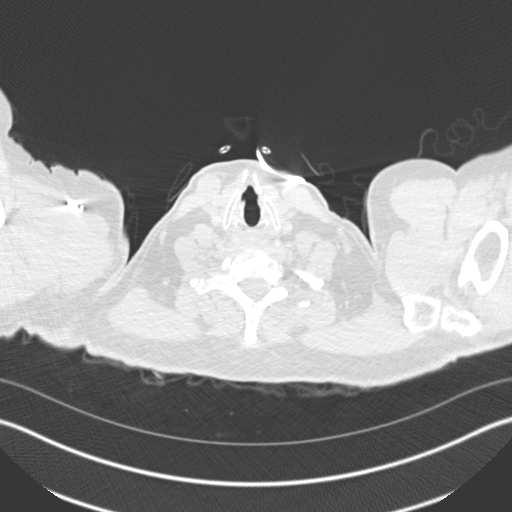

[Series 8: coronal mpr · coronal · 0.59mm/px · 3 of 151 slices shown]
[im 38/151  soft-tissue]
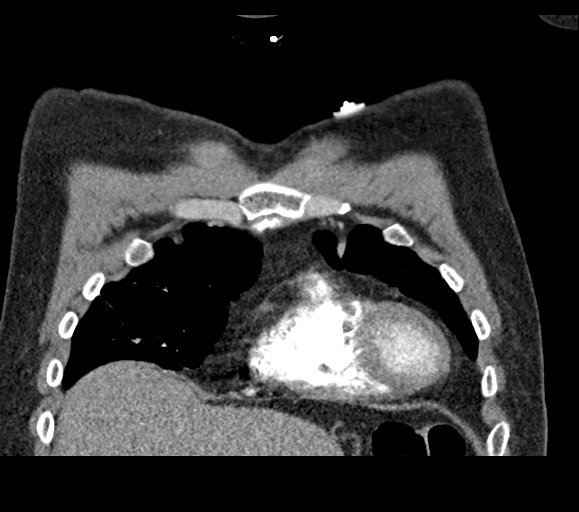
[im 76/151  soft-tissue]
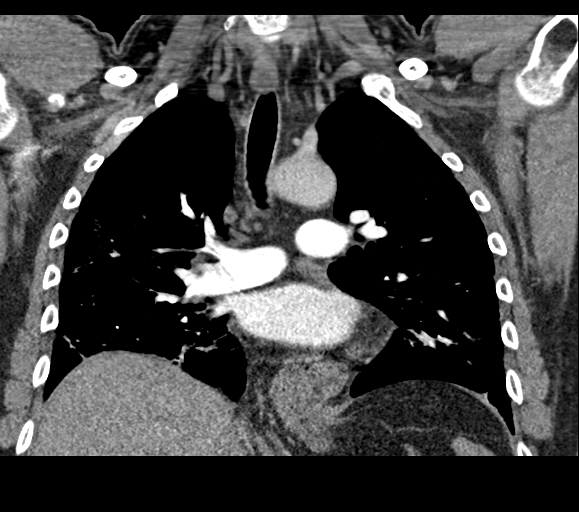
[im 113/151  soft-tissue]
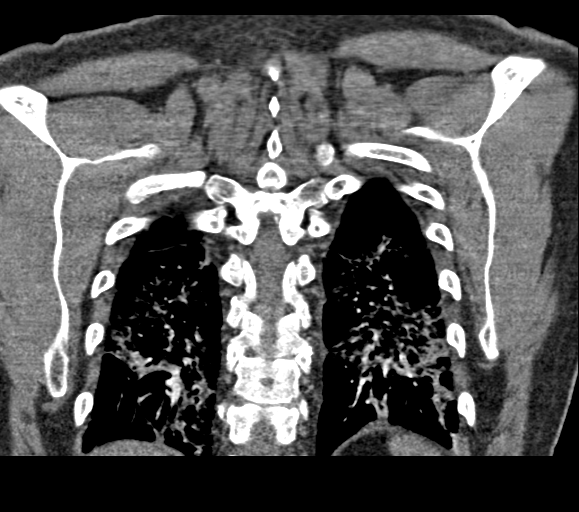

[18 of 46 positions shown; findings below may reference images not displayed]

FINDINGS: Cardiovascular: This is a technically adequate evaluation of the
pulmonary vasculature. No filling defects or pulmonary emboli.

The heart is unremarkable without pericardial effusion. Normal
caliber of the thoracic aorta. Minimal atherosclerosis of the
coronary vasculature.

Incidental note is made partial anomalous pulmonary venous return
with the left upper lobe pulmonary vein draining into the left
brachiocephalic vein.

Mediastinum/Nodes: No enlarged mediastinal, hilar, or axillary lymph
nodes. Thyroid gland, trachea, and esophagus demonstrate no
significant findings. There is a small hiatal hernia.

Lungs/Pleura: Multifocal bilateral ground-glass airspace disease is
identified, greatest in the mid and lower lung zones, consistent
with multifocal pneumonia and history of OXFZL-E0. No effusion or
pneumothorax. Central airways are patent.

Upper Abdomen: No acute abnormality.

Musculoskeletal: No acute or destructive bony lesions. Reconstructed
images demonstrate no additional findings.

Review of the MIP images confirms the above findings.
IMPRESSION: 1. No evidence of pulmonary embolus.
2. Multifocal bilateral ground-glass airspace disease, greatest in
the mid and lower lung zones, consistent with multifocal pneumonia
and history of OXFZL-E0.
3. Incidental partial anomalous pulmonary venous return, with left
upper lobe pulmonary vein draining into the left brachiocephalic
vein.
4. Small hiatal hernia.

## 2021-06-14 IMAGING — DX DG CHEST 1V PORT
1 series · 1 of 1 positions shown · non-contrast
Comparison: August 27, 2019

CLINICAL DATA: COVID positive

EXAM:
PORTABLE CHEST 1 VIEW

[chest]
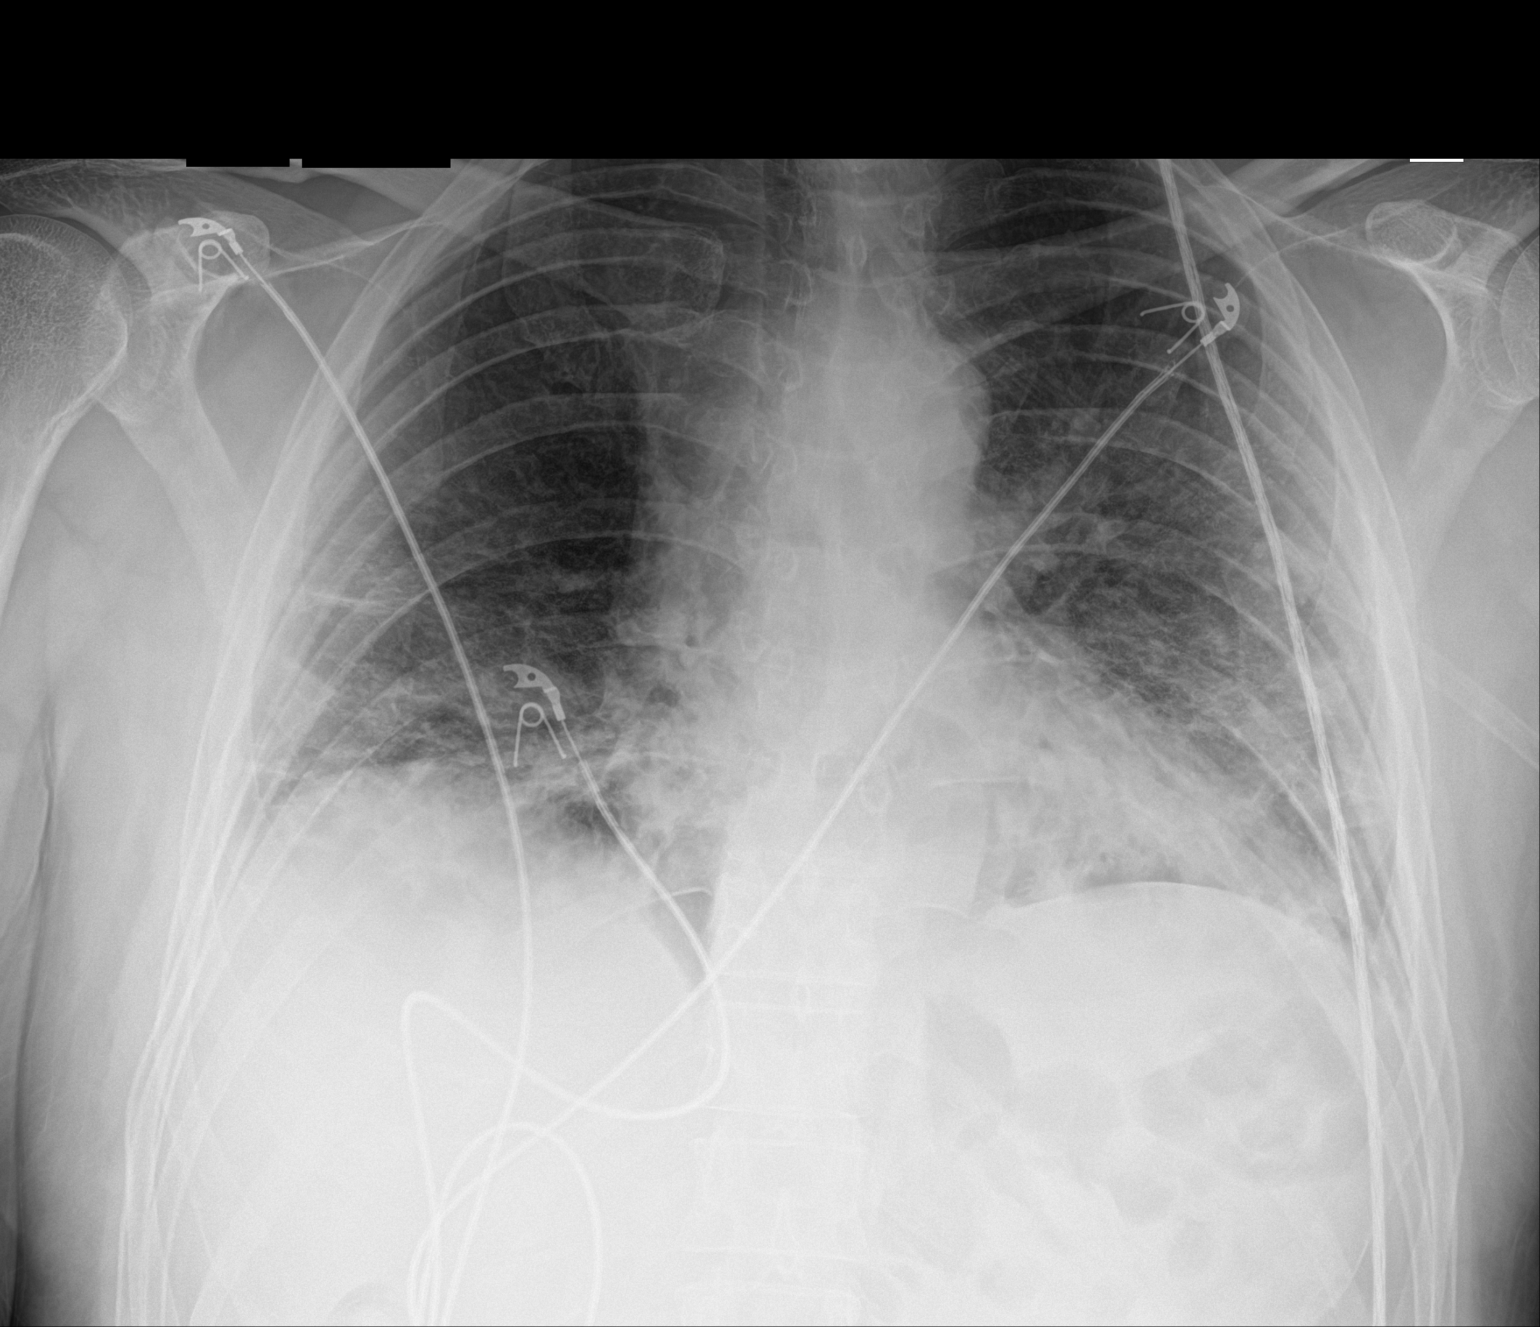

[1 of 1 positions shown; findings below may reference images not displayed]

FINDINGS: Cardiomediastinal contours are partially obscured by increasing
basilar opacities since the prior study. Lung volumes are diminished
compared to the previous exam.

On limited assessment no acute skeletal process.
IMPRESSION: 1. Worsening basilar airspace disease and slight decrease in lung
volumes in the setting of [id number] pneumonia.

## 2021-06-15 IMAGING — DX DG CHEST 1V PORT
1 series · 1 of 1 positions shown · non-contrast
Comparison: 08/30/2019.

CLINICAL DATA: Shortness of breath.  COVID.

EXAM:
PORTABLE CHEST 1 VIEW

[chest]
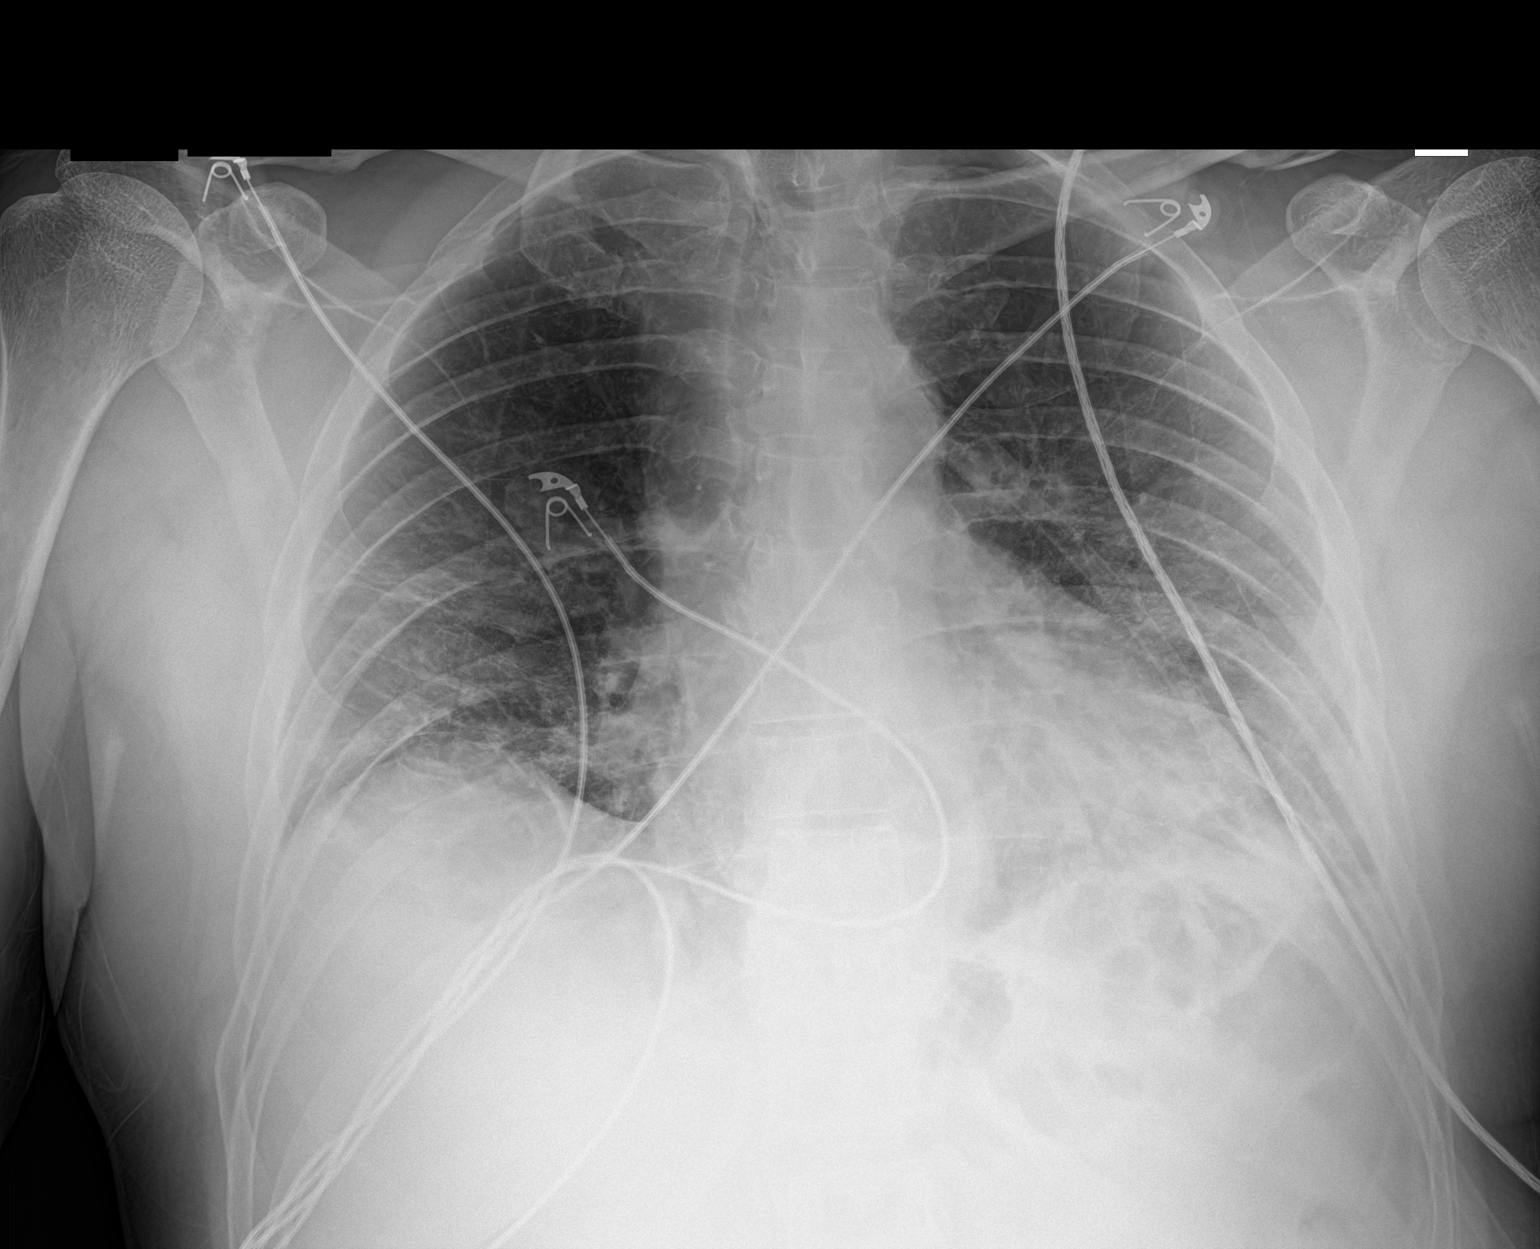

[1 of 1 positions shown; findings below may reference images not displayed]

FINDINGS: Mediastinum hilar structures normal. Heart size stable. Low lung
volumes with persistent bibasilar pulmonary infiltrates. Similar
findings on prior exam. No pleural effusion or pneumothorax.
IMPRESSION: Low lung volumes with persistent bibasilar infiltrates in this known
COVID positive patient. Similar findings noted on prior exam.

## 2021-06-17 IMAGING — DX DG CHEST 1V PORT
1 series · 1 of 1 positions shown · non-contrast
Comparison: August 31, 2019

CLINICAL DATA: [id number].  Shortness of breath.

EXAM:
PORTABLE CHEST 1 VIEW

[chest ap]
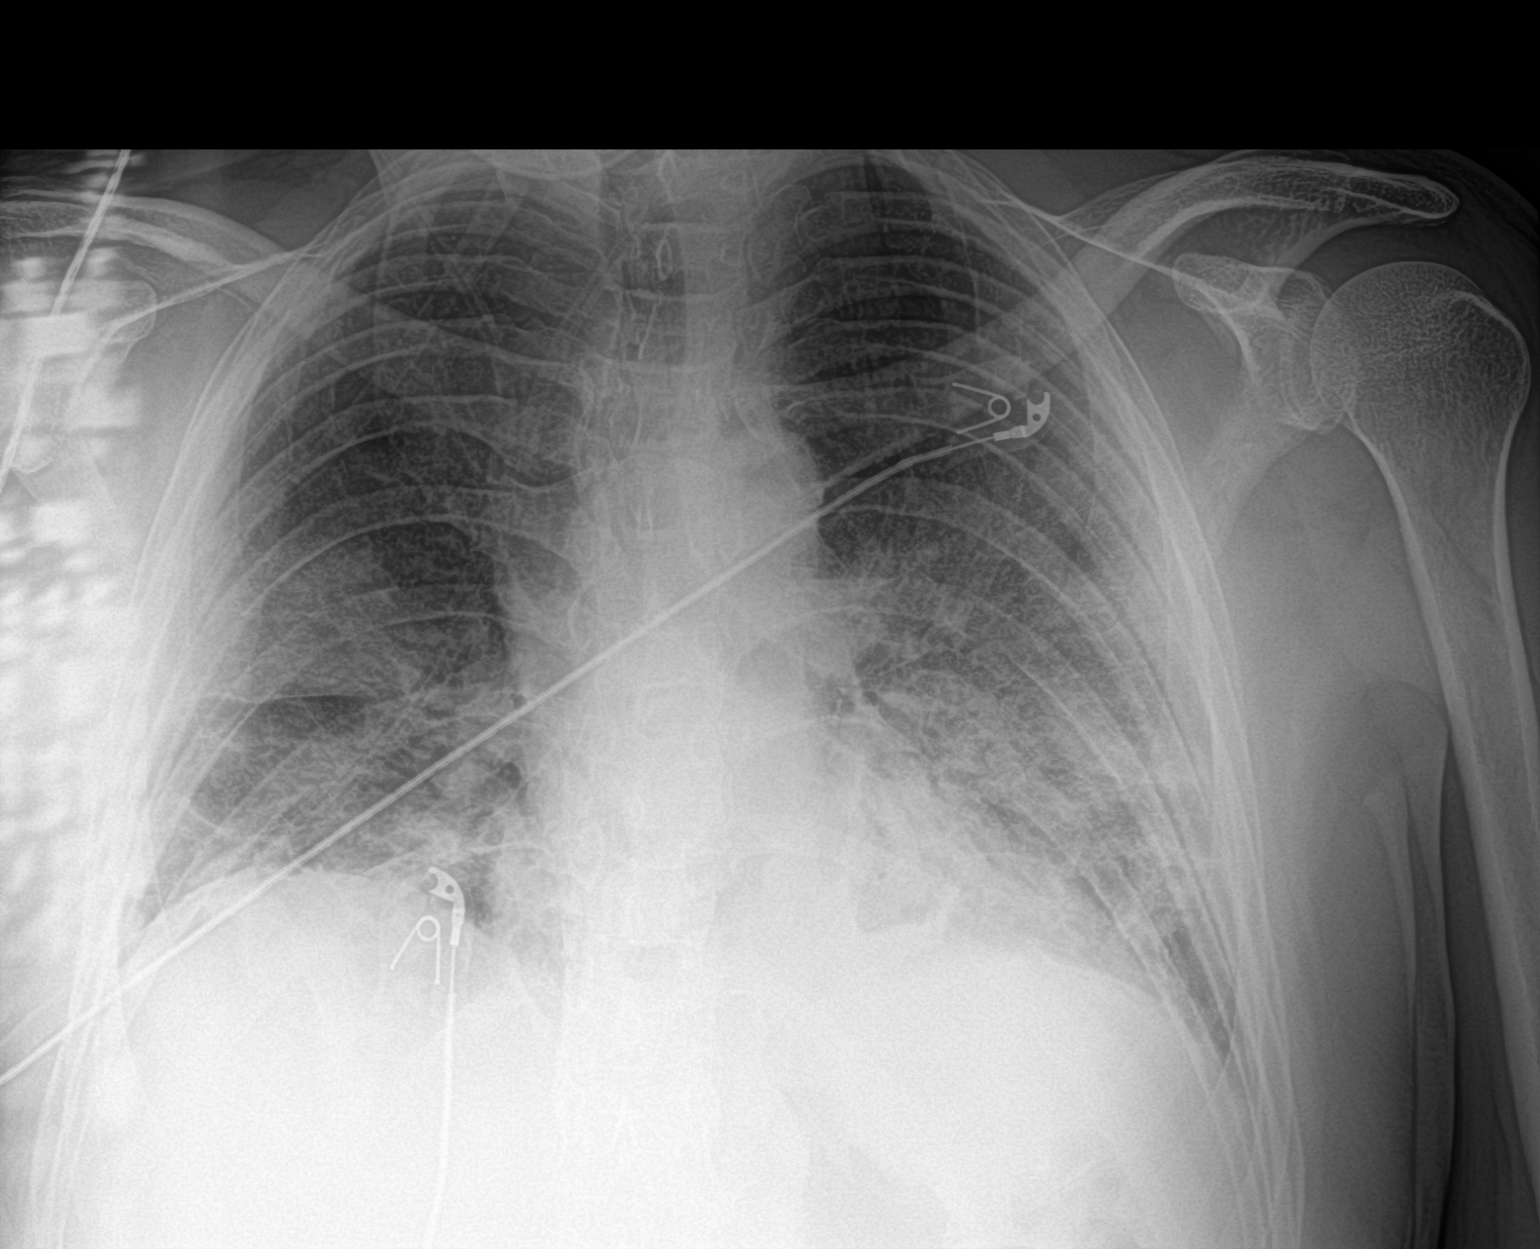

[1 of 1 positions shown; findings below may reference images not displayed]

FINDINGS: Increasing bilateral pulmonary infiltrates.  No other changes.
IMPRESSION: Increasing bilateral pulmonary infiltrates consistent with worsening
Y41GU-M4 pneumonia. No other change.

## 2021-06-18 IMAGING — DX DG CHEST 1V PORT
1 series · 1 of 1 positions shown · non-contrast
Comparison: September 02, 2019

CLINICAL DATA: Decreased oxygen saturation

EXAM:
PORTABLE CHEST 1 VIEW

[chest ap]
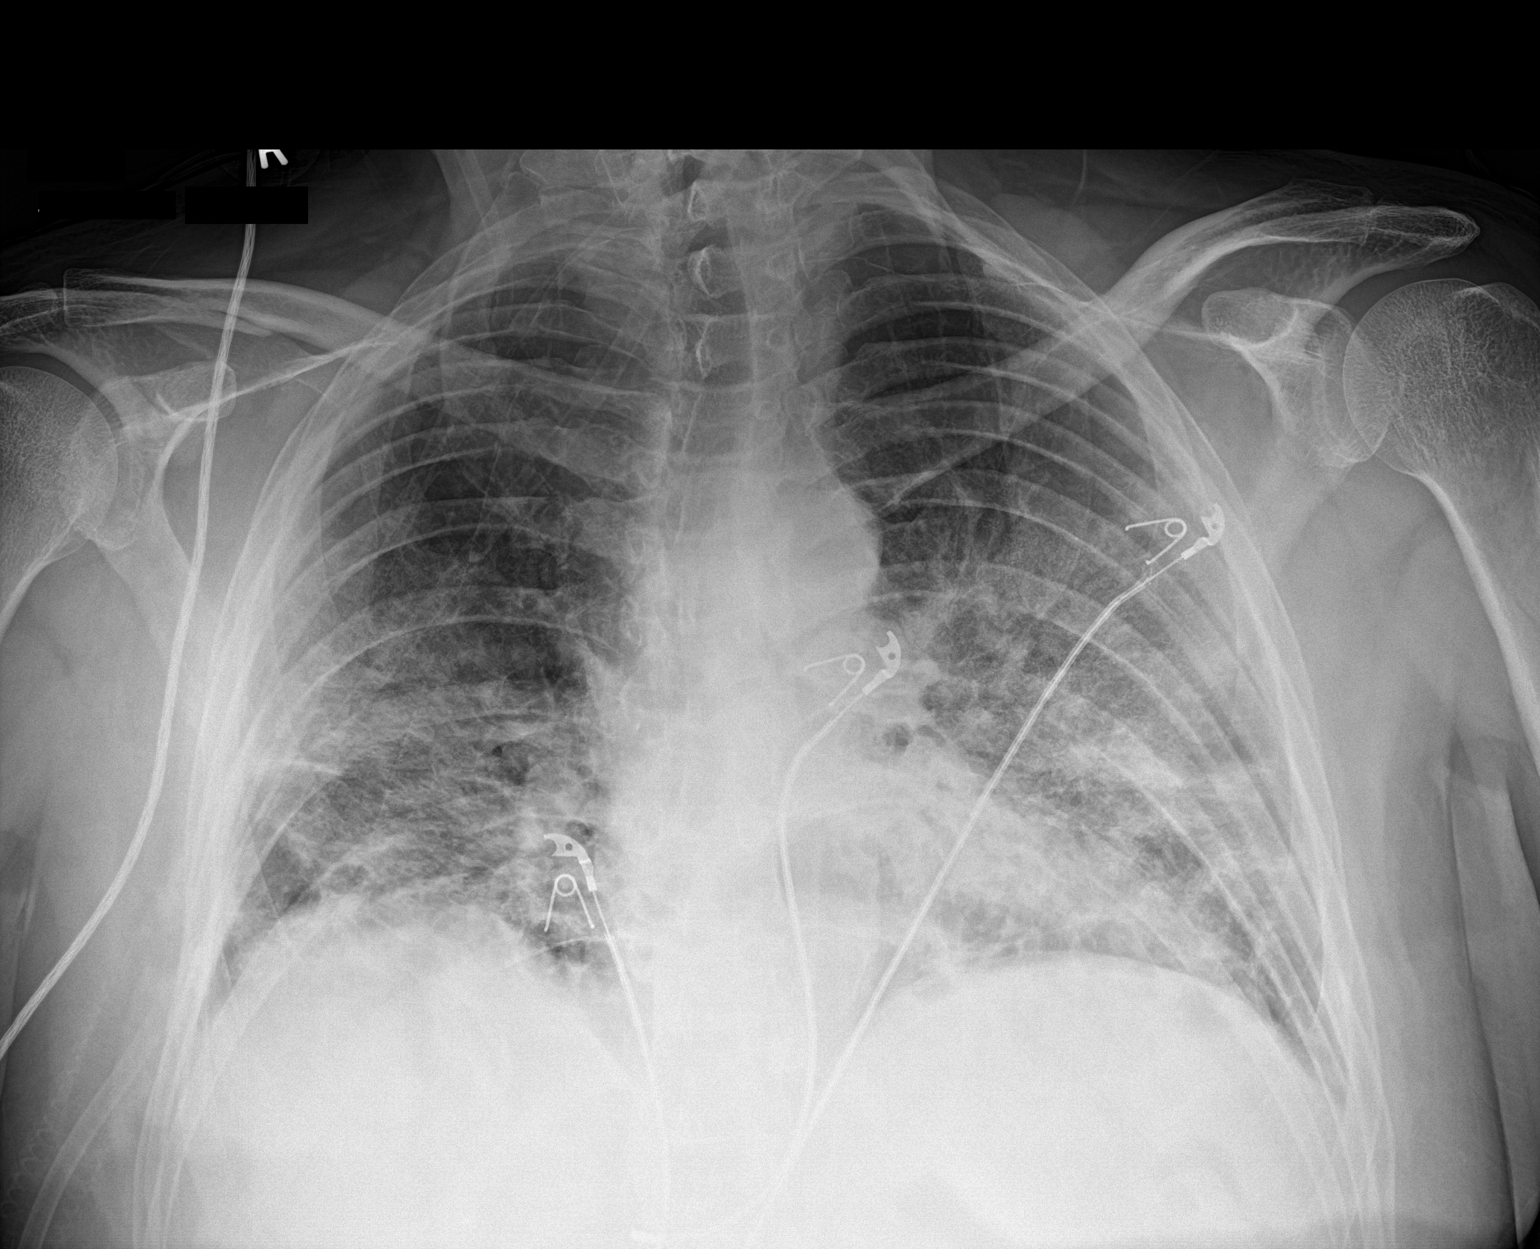

[1 of 1 positions shown; findings below may reference images not displayed]

FINDINGS: Airspace opacity seen throughout both mid and lower lung regions,
similar to 1 day prior. Heart size is upper normal with pulmonary
vascularity normal. No adenopathy. No bone lesions.
IMPRESSION: Stable airspace opacity throughout the mid and lower lung regions.
Suspect atypical organism pneumonia. Appearance similar to 1 day
prior. Stable cardiac silhouette.

## 2022-03-18 DIAGNOSIS — R55 Syncope and collapse: Secondary | ICD-10-CM

## 2022-03-22 DIAGNOSIS — M79641 Pain in right hand: Secondary | ICD-10-CM | POA: Insufficient documentation

## 2022-03-22 DIAGNOSIS — G629 Polyneuropathy, unspecified: Secondary | ICD-10-CM | POA: Insufficient documentation

## 2022-03-22 DIAGNOSIS — R9389 Abnormal findings on diagnostic imaging of other specified body structures: Secondary | ICD-10-CM | POA: Insufficient documentation

## 2022-03-22 DIAGNOSIS — J310 Chronic rhinitis: Secondary | ICD-10-CM | POA: Insufficient documentation

## 2022-03-22 DIAGNOSIS — E349 Endocrine disorder, unspecified: Secondary | ICD-10-CM | POA: Insufficient documentation

## 2022-03-22 DIAGNOSIS — Q263 Partial anomalous pulmonary venous connection: Secondary | ICD-10-CM | POA: Insufficient documentation

## 2022-03-22 DIAGNOSIS — E782 Mixed hyperlipidemia: Secondary | ICD-10-CM | POA: Insufficient documentation

## 2022-03-22 DIAGNOSIS — K219 Gastro-esophageal reflux disease without esophagitis: Secondary | ICD-10-CM | POA: Insufficient documentation

## 2022-03-22 DIAGNOSIS — G4733 Obstructive sleep apnea (adult) (pediatric): Secondary | ICD-10-CM | POA: Insufficient documentation

## 2022-03-22 DIAGNOSIS — Z8616 Personal history of COVID-19: Secondary | ICD-10-CM | POA: Insufficient documentation

## 2022-04-14 NOTE — Progress Notes (Unsigned)
Cardiology Office Note:    Date:  04/15/2022   ID:  Samuel [name], DOB [birth year], MRN [id number]  PCP:  Samuel Camel, NP  Cardiologist:  Samuel More, MD   Referring MD: Samuel [name]*  ASSESSMENT:    1. Partial anomalous pulmonary venous return   2. OSA (obstructive sleep apnea)   3. Incomplete RBBB   4. Near syncope    PLAN:    In order of problems listed above:  Clinically does not appear to have a significant shunt however he requires further evaluation I think the first will obtain echocardiogram with shunt QP/QRS close attention right heart size function any questions cardiac norms appropriate. Stable and continue his CPAP Stable EKG pattern Also 1 week ZIO monitor for his episode of near syncope  Next appointment 6-8 weeks   Medication Adjustments/Labs and Tests Ordered: Current medicines are reviewed at length with the patient today.  Concerns regarding medicines are outlined above.  Orders Placed This Encounter  Procedures   LONG TERM MONITOR (3-14 DAYS)   EKG 12-Lead   ECHOCARDIOGRAM COMPLETE   No orders of the defined types were placed in this encounter.    Chief Complaint  Patient presents with   Shortness of Breath    History of Present Illness:    Samuel [name] is a 57 y.o. male who is being seen today for the evaluation of exercise intolerance following COVID-19 infection at the request of Samuel [name], Samuel [name] Kitchen*.  Echocardiogram 11/21/2020 showed normal right ventricular and right atrial size function no findings of pulmonary hypertension and the IVC was normal normal right atrial pressure.  QP/QS was not calculated.  CT scan while hospitalized with COVID-19 pneumonia show partial anomalous pulmonary venous return left upper lobe to the left brachiocephalic isolated without ASD.  He was previously seen at [facility] most recently on 11/21/2020 found to have left upper lobe pulmonary  vein drainage and left brachiocephalic vein incidentally on CT scan.  CT scan showed multiple bilateral groundglass airspace disease consistent with multifocal pneumonia COVID-19 infection.  His echocardiogram showed normal left and right heart size and function.  His EKG showed sinus bradycardia incomplete right bundle branch block.  He also had exertional hypoxia immediately following COVID-19 infection that improved.  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 He is not having edema chest pain palpitation or syncope 1 evening he got out of bed to go to the bathroom and nearly lost consciousness Previously he was wearing support hose  The clinical question is whether he has a significant shunt from his partial anomalous pulmonary venous connection Less common with isolated without ASD per recent literature review showed up to 40 to 50% of people have a shunt and greater than 1.5 Will start with utilizing echocardiogram and calculation fraction and evaluate right heart size and function.  If there is unresolved questions cardiac MRI is the appropriate test  I think it also would benefit from a ZIO monitor.  Past Medical History:  Diagnosis Date   Abnormal computed tomography angiography (CTA)    Bilateral hand pain  Chronic rhinitis    GERD (gastroesophageal reflux disease)    History of COVID-19    Mixed hyperlipidemia    Neuropathy    Obstructive sleep apnea syndrome    Partial anomalous pulmonary venous return    Testosterone deficiency     Past Surgical History:  Procedure Laterality Date   HERNIA  REPAIR      Current Medications: Current Meds  Medication Sig   aspirin EC 81 MG tablet Take 81 mg by mouth daily. Swallow whole.   clonazePAM (KLONOPIN) 1 MG tablet Take 0.5 tablets (0.5 mg total) by mouth daily.   escitalopram (LEXAPRO) 10 MG tablet Take 10 mg by mouth daily.   gabapentin (NEURONTIN) 300 MG capsule Take 1 capsule by mouth at bedtime.   Multiple Vitamins-Minerals (VITAMIN D3 COMPLETE PO) Take 3,000 Units by mouth daily in the afternoon.   Omega-3 Fatty Acids (FISH OIL) 1000 MG CAPS Take 1 capsule by mouth 2 (two) times daily.   omeprazole (PRILOSEC) 20 MG capsule Take 20 mg by mouth daily.   vitamin B-12 (CYANOCOBALAMIN) 1000 MCG tablet Take 1,000 mcg by mouth daily.     Allergies:   Milk-related compounds and Rivaroxaban   Social History   Socioeconomic History   Marital status: Married    Spouse name: Not on file   Number of children: Not on file   Years of education: Not on file   Highest education level: Not on file  Occupational History   Not on file  Tobacco Use   Smoking status: Never   Smokeless tobacco: Never  Vaping Use   Vaping Use: Never used  Substance and Sexual Activity   Alcohol use: Not Currently   Drug use: Not Currently   Sexual activity: Yes    Birth control/protection: None  Other Topics Concern   Not on file  Social History Narrative   Not on file   Social Determinants of Health   Financial Resource Strain: Not on file  Food Insecurity: Not on file  Transportation Needs: Not on file  Physical Activity: Not on file  Stress: Not on file  Social Connections: Not on file     Family History: The patient's family history includes Hypothyroidism in his father; Lung cancer in his father.  ROS:   ROS Please see the history of present illness.     All other systems reviewed and are negative.  EKGs/Labs/Other Studies Reviewed:    The following studies were reviewed today:   Cardiac Studies & Procedures        ECHOCARDIOGRAM  ECHOCARDIOGRAM COMPLETE 11/21/2020  Narrative ECHOCARDIOGRAM REPORT    Patient Name:   Samuel [name] [facility] Date of Exam: 11/21/2020 Medical Rec #:  [id number]                Height:       72.0 in Accession #:    [id number]               Weight:       209.6 lb Date of Birth:  Mar 12, 1965                BSA:          2.173 m Patient Age:    89 years                 BP:           144/79 mmHg Patient Gender: M  HR:           59 bpm. Exam Location:  Outpatient  Procedure: 2D Echo, Cardiac Doppler and Color Doppler  Indications:    Congestive Heart Failure I50.9  History:        Patient has prior history of Echocardiogram examinations, most recent 08/27/2019.  Sonographer:    Bernadene Person RDCS Referring Phys: [id number] DANIEL R BENSIMHON  IMPRESSIONS   1. Left ventricular ejection fraction, by estimation, is 55%. The left ventricle has normal function. The left ventricle has no regional wall motion abnormalities. Left ventricular diastolic parameters were normal. 2. Right ventricular systolic function is normal. The right ventricular size is normal. There is normal pulmonary artery systolic pressure. 3. Left atrial size was mildly dilated. 4. Right atrial size was mildly dilated. 5. The mitral valve is normal in structure. Mild mitral valve regurgitation. No evidence of mitral stenosis. 6. The aortic valve is normal in structure. Aortic valve regurgitation is not visualized. No aortic stenosis is present. 7. The inferior vena cava is normal in size with greater than 50% respiratory variability, suggesting right atrial pressure of 3 mmHg.  FINDINGS Left Ventricle: Left ventricular ejection fraction, by estimation, is 55%. The left ventricle has normal function. The left ventricle has no regional wall motion abnormalities. The left ventricular internal cavity size was normal in size. There is no left ventricular hypertrophy. Left ventricular  diastolic parameters were normal.  Right Ventricle: The right ventricular size is normal. No increase in right ventricular wall thickness. Right ventricular systolic function is normal. There is normal pulmonary artery systolic pressure. The tricuspid regurgitant velocity is 1.58 m/s, and with an assumed right atrial pressure of 3 mmHg, the estimated right ventricular systolic pressure is 123XX123 mmHg.  Left Atrium: Left atrial size was mildly dilated.  Right Atrium: Right atrial size was mildly dilated.  Pericardium: There is no evidence of pericardial effusion.  Mitral Valve: The mitral valve is normal in structure. Mild mitral valve regurgitation. No evidence of mitral valve stenosis.  Tricuspid Valve: The tricuspid valve is normal in structure. Tricuspid valve regurgitation is trivial. No evidence of tricuspid stenosis.  Aortic Valve: The aortic valve is normal in structure. Aortic valve regurgitation is not visualized. No aortic stenosis is present.  Pulmonic Valve: The pulmonic valve was normal in structure. Pulmonic valve regurgitation is trivial. No evidence of pulmonic stenosis.  Aorta: The aortic root is normal in size and structure.  Venous: The inferior vena cava is normal in size with greater than 50% respiratory variability, suggesting right atrial pressure of 3 mmHg.  IAS/Shunts: No atrial level shunt detected by color flow Doppler.   LEFT VENTRICLE PLAX 2D LVIDd:         4.60 cm      Diastology LVIDs:         2.80 cm      LV e' medial:    7.05 cm/s LV PW:         0.90 cm      LV E/e' medial:  13.6 LV IVS:        1.00 cm      LV e' lateral:   11.50 cm/s LVOT diam:     2.20 cm      LV E/e' lateral: 8.4 LV SV:         76 LV SV Index:   35 LVOT Area:     3.80 cm  LV Volumes (MOD) LV vol d, MOD A2C: 110.0 ml LV vol d,  MOD A4C: 98.7 ml LV vol s, MOD A2C: 45.6 ml LV vol s, MOD A4C: 50.2 ml LV SV MOD A2C:     64.4 ml LV SV MOD A4C:     98.7 ml LV SV MOD BP:      57.0  ml  RIGHT VENTRICLE RV S prime:     12.40 cm/s TAPSE (M-mode): 2.0 cm  LEFT ATRIUM             Index        RIGHT ATRIUM           Index LA diam:        3.50 cm 1.61 cm/m   RA Area:     17.20 cm LA Vol (A2C):   48.5 ml 22.31 ml/m  RA Volume:   45.60 ml  20.98 ml/m LA Vol (A4C):   45.8 ml 21.07 ml/m LA Biplane Vol: 47.8 ml 21.99 ml/m AORTIC VALVE LVOT Vmax:   83.10 cm/s LVOT Vmean:  56.600 cm/s LVOT VTI:    0.200 m  AORTA Ao Root diam: 4.00 cm Ao Asc diam:  3.60 cm  MITRAL VALVE               TRICUSPID VALVE MV Area (PHT): 4.21 cm    TR Peak grad:   10.0 mmHg MV Decel Time: 180 msec    TR Vmax:        158.00 cm/s MV E velocity: 96.20 cm/s MV A velocity: 56.70 cm/s  SHUNTS MV E/A ratio:  1.70        Systemic VTI:  0.20 m Systemic Diam: 2.20 cm  Glori Bickers MD Electronically signed by Glori Bickers MD Signature Date/Time: 11/21/2020/3:19:55 PM    Final    MONITORS  LONG TERM MONITOR (3-14 DAYS) 12/21/2020  Narrative Patch Wear Time:  13 days and 18 hours (2022-11-11T15:43:22-0500 to 2022-11-25T09:48:39-0500)  1. Predominant underlying rhythm was Sinus Rhythm - average rate 66 bpm. 2. Rare PACs and PVCs. 3. One patient report event which was associated with sinus rhythm  Glori Bickers, MD 11:43 PM           EKG:  EKG is  ordered today.  The ekg ordered today is personally reviewed and demonstrates sinus rhythm incomplete right bundle branch block there is no right atrial enlargement or right ventricular hypertrophy  Physical Exam:    VS:  BP 120/82 (BP Location: Right Arm, Patient Position: Sitting)   Pulse 64   Ht 6' (1.829 m)   Wt 218 lb (98.9 kg)   SpO2 97%   BMI 29.57 kg/m     Wt Readings from Last 3 Encounters:  04/15/22 218 lb (98.9 kg)  11/21/20 215 lb 3.2 oz (97.6 kg)  10/24/19 209 lb 9.6 oz (95.1 kg)     GEN:  Well nourished, well developed in no acute distress HEENT: Normal NECK: No JVD; No carotid bruits LYMPHATICS: No  lymphadenopathy CARDIAC: RRR, no murmurs, rubs, gallops first and second heart sounds are normal he has no pulmonic normal and no neck vein distention RESPIRATORY:  Clear to auscultation without rales, wheezing or rhonchi  ABDOMEN: Soft, non-tender, non-distended MUSCULOSKELETAL:  No edema; No deformity  SKIN: Warm and dry NEUROLOGIC:  Alert and oriented x 3 PSYCHIATRIC:  Normal affect     Signed, Samuel More, MD  04/15/2022 10:46 AM    [place]

## 2022-04-15 ENCOUNTER — Encounter: Payer: Self-pay | Admitting: Cardiology

## 2022-04-15 ENCOUNTER — Ambulatory Visit: Payer: 59 | Attending: Cardiology

## 2022-04-15 ENCOUNTER — Ambulatory Visit: Payer: 59 | Attending: Cardiology | Admitting: Cardiology

## 2022-04-15 VITALS — BP 120/82 | HR 64 | Ht 72.0 in | Wt 218.0 lb

## 2022-04-15 DIAGNOSIS — I4510 Unspecified right bundle-branch block: Secondary | ICD-10-CM

## 2022-04-15 DIAGNOSIS — G4733 Obstructive sleep apnea (adult) (pediatric): Secondary | ICD-10-CM

## 2022-04-15 DIAGNOSIS — Q263 Partial anomalous pulmonary venous connection: Secondary | ICD-10-CM

## 2022-04-15 DIAGNOSIS — R55 Syncope and collapse: Secondary | ICD-10-CM | POA: Diagnosis not present

## 2022-04-15 NOTE — Patient Instructions (Signed)
Medication Instructions:  Your physician recommends that you continue on your current medications as directed. Please refer to the Current Medication list given to you today.  *If you need a refill on your cardiac medications before your next appointment, please call your pharmacy*   Lab Work: None If you have labs (blood work) drawn today and your tests are completely normal, you will receive your results only by: [place] (if you have MyChart) OR A paper copy in the mail If you have any lab test that is abnormal or we need to change your treatment, we will call you to review the results.   Testing/Procedures: Your physician has requested that you have an echocardiogram. Echocardiography is a painless test that uses sound waves to create images of your heart. It provides your doctor with information about the size and shape of your heart and how well your heart's chambers and valves are working. This procedure takes approximately one hour. There are no restrictions for this procedure. Please do NOT wear cologne, perfume, aftershave, or lotions (deodorant is allowed). Please arrive 15 minutes prior to your appointment time.  A zio monitor was ordered today. It will remain on for 7 days. You will then return monitor and event diary in provided box. It takes 1-2 weeks for report to be downloaded and returned to [place]. We will call you with the results. If monitor falls off or has orange flashing light, please call Zio for further instructions.       Follow-Up: At [facility], you and your health needs are our priority.  As part of our continuing mission to provide you with exceptional heart care, we have created designated Provider Care Teams.  These Care Teams include your primary Cardiologist (physician) and Advanced Practice Providers (APPs -  Physician Assistants and Nurse Practitioners) who all work together to provide you with the care you need, when you need it.  We  recommend signing up for the patient portal called "MyChart".  Sign up information is provided on this After Visit Summary.  MyChart is used to connect with patients for Virtual Visits (Telemedicine).  Patients are able to view lab/test results, encounter notes, upcoming appointments, etc.  Non-urgent messages can be sent to your provider as well.   To learn more about what you can do with MyChart, go to [email or web address].      Your next appointment:   8 week(s)  Provider:   Shirlee More, MD    Other Instructions None

## 2022-04-27 ENCOUNTER — Ambulatory Visit: Payer: 59 | Attending: Cardiology

## 2022-04-27 DIAGNOSIS — Q263 Partial anomalous pulmonary venous connection: Secondary | ICD-10-CM | POA: Diagnosis not present

## 2022-04-27 DIAGNOSIS — G4733 Obstructive sleep apnea (adult) (pediatric): Secondary | ICD-10-CM

## 2022-04-27 DIAGNOSIS — I4510 Unspecified right bundle-branch block: Secondary | ICD-10-CM | POA: Diagnosis not present

## 2022-04-28 LAB — ECHOCARDIOGRAM COMPLETE
P 1/2 time: 621 msec
S' Lateral: 2.9 cm

## 2022-04-30 ENCOUNTER — Telehealth: Payer: Self-pay | Admitting: Cardiology

## 2022-04-30 NOTE — Telephone Encounter (Signed)
Patient is returning call to discuss echo results. °

## 2022-05-05 NOTE — Telephone Encounter (Signed)
Patient informed of results.  

## 2022-06-08 ENCOUNTER — Other Ambulatory Visit: Payer: Self-pay

## 2022-06-10 NOTE — Progress Notes (Addendum)
Cardiology Office Note:    Date:  06/28/2022   ID:  Samuel [name], DOB [birth year], MRN [id number]  PCP:  Krystal Clark, NP  Cardiologist:  Norman Herrlich, MD    Referring MD: Rhea Bleacher*    ASSESSMENT:    1. Partial anomalous pulmonary venous return   2. Incomplete RBBB   3. Secondary dysautonomia    PLAN:    In order of problems listed above:  Fortunately he does not have a significant shunt or right heart enlargement no indication for closure and I think she has an obvious return is unrelated to his symptoms. 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  Unfortunately he has not recovered with time.  He continues to struggle with activities particularly with exertion and standing for any prolonged period of time.   Next appointment: 1 year   Medication Adjustments/Labs and Tests Ordered: Current medicines are reviewed at length with the patient today.  Concerns regarding medicines are outlined above.  No orders of the defined types were placed in this encounter.  No orders of the defined types were placed in this encounter.   No chief complaint on file.   History of Present Illness:    Samuel [name] is a 57 y.o. 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  Following that visit he underwent an echocardiogram which showed no indication of significant shunt right atrium right ventricle  normal in size and QP/ QS was normal at 1.1  He also had an event monitor reported 05/02/2022 showing sinus rhythm no pauses or episodes of second or third-degree AV nodal block both ventricular and supraventricular ectopy were rare and the symptomatic events all sinus rhythm and 1 had a single atrial premature contraction.  Compliance with diet, lifestyle and medications: Yes  He continues to have episodes with exertion a profound weakness no loss of consciousness his wife is checked blood pressure looks normal and he recovered spontaneously  Has had events when he is worn a monitor.  He has a smart watch has not set up for capture and has been given instructions with [employer] watch to capture events significant symptoms related to her MyChart.  Do not think he needs an implanted loop recorder  I reviewed his testing with him Past Medical History:  Diagnosis Date   Abnormal computed tomography angiography (CTA)    Acute respiratory disease due to COVID-19 virus 08/24/2019   Onset of symptoms 08/18/19 sp dex/ remdesovir and actemra   - restarted prednisone per PCP 09/10/19 along with gabapentin for parasthesias   - 09/13/2019  After extensive coaching inhaler device,  effectiveness =    0% > 75% > continue combivent 4 h prn   - 10/24/2019 still reports desats with steps/hills but declined to continue 02 rx   Adjustment insomnia 09/10/2019   Anxiety about health 09/10/2019   Bilateral hand pain    Chronic respiratory failure with hypoxia (HCC) 09/13/2019   As dc/ from Covid admit  08/2019:  3lpm hs, titrate daytime to keep sats >90% at all times    Chronic rhinitis    Cough 09/13/2019   Onset with covid 08/2019   - rx 09/13/2019 = max gerd rx / gabapentin    GERD (gastroesophageal reflux disease)    GERD without esophagitis 01/02/2020   History of COVID-19    History of phlebitis 10/11/2019   Formatting of this note might be different from the original. Left upper extremity Formatting of this note  might be different from the original. Left upper extremity   Malaise and fatigue 07/27/2020   Mixed hyperlipidemia    Near syncope 03/18/2022   Neuropathy    Obstructive sleep apnea syndrome    Partial anomalous pulmonary venous return    Testosterone deficiency     Past Surgical History:  Procedure Laterality Date   HERNIA REPAIR      Current Medications: Current Meds  Medication Sig   aspirin EC 81 MG tablet Take 81 mg by mouth daily. Swallow whole.   atorvastatin (LIPITOR) 10 MG tablet Take 10 mg by mouth daily.   clonazePAM (KLONOPIN) 1 MG tablet Take 0.5 tablets (0.5 mg total) by mouth daily.   escitalopram (LEXAPRO) 10 MG tablet Take 10 mg by mouth daily.   gabapentin (NEURONTIN) 300 MG capsule Take 1 capsule by mouth at bedtime.   Multiple Vitamins-Minerals (VITAMIN D3 COMPLETE PO) Take 2,000 Units by mouth daily in the afternoon.   Omega-3 Fatty Acids (FISH OIL) 1000 MG CAPS Take 1 capsule by mouth 2 (two) times daily.   omeprazole (PRILOSEC) 20 MG capsule Take 20 mg by mouth daily.   vitamin B-12 (CYANOCOBALAMIN) 1000 MCG tablet Take 1,000 mcg by mouth daily.     Allergies:   Milk-related compounds and Rivaroxaban   Social History   Socioeconomic History   Marital status: Married    Spouse name: Not on file   Number of children: Not on file   Years of education: Not on file   Highest education level: Not on file  Occupational History   Not on file  Tobacco Use   Smoking status: Never   Smokeless tobacco: Never  Vaping Use   Vaping Use: Never used  Substance and Sexual Activity   Alcohol use: Not Currently   Drug use: Not Currently   Sexual activity: Yes    Birth control/protection: None  Other Topics Concern   Not on file  Social History Narrative   Not on file   Social Determinants of Health   Financial Resource Strain: Not on file  Food Insecurity: Not on file  Transportation Needs: Not on file  Physical Activity: Not on file  Stress: Not on  file  Social Connections: Not on file     Family History: The patient's family history includes Hypothyroidism in his father; Lung cancer in his father. ROS:   Please see the history of present illness.    All other systems reviewed and are negative.  EKGs/Labs/Other Studies Reviewed:    The following studies were reviewed today:  Cardiac Studies & Procedures       ECHOCARDIOGRAM  ECHOCARDIOGRAM COMPLETE 04/28/2022  Narrative ECHOCARDIOGRAM REPORT    Patient Name:   Samuel [name] Date of Exam: 04/27/2022 Medical Rec #:  [id number]                Height:       72.0 in Accession #:    [id number]  Weight:       218.0 lb Date of Birth:  [birth year]                BSA:          2.210 m Patient Age:    56 years                 BP:           120/82 mmHg Patient Gender: M                        HR:           53 bpm. Exam Location:  [place]  Procedure: 2D Echo, Cardiac Doppler, Color Doppler and Strain Analysis  Indications:    Partial anomalous pulmonary venous return [Q26.3 (ICD-10-CM)] (Isolated without ASD involving the left upper pulmonary vein to the brachial cephalic); OSA (obstructive sleep apnea) [G47.33 (ICD-10-CM)]; Incomplete RBBB [I45.10 (ICD-10-CM)]  History:        Patient has prior history of Echocardiogram examinations, most recent 11/21/2020. Signs/Symptoms:Acute respiratory disease due to COVID-19 virus; Risk Factors:Dyslipidemia.  Sonographer:    Louie Boston RDCS Referring Phys: [id number] Maiah Sinning J Shakeda Pearse  IMPRESSIONS   1. Left ventricular ejection fraction, by estimation, is 60 to 65%. The left ventricle has normal function. The left ventricle has no regional wall motion abnormalities. Left ventricular diastolic parameters were normal. The average left ventricular global longitudinal strain is -20.4 %. The global longitudinal strain is normal. 2. Qp/Qs 1.1. Right ventricular systolic function is normal. The right ventricular size is  normal. There is normal pulmonary artery systolic pressure. 3. The mitral valve is normal in structure. Trivial mitral valve regurgitation. No evidence of mitral stenosis. 4. The aortic valve is normal in structure. Aortic valve regurgitation is trivial. No aortic stenosis is present. 5. The inferior vena cava is normal in size with greater than 50% respiratory variability, suggesting right atrial pressure of 3 mmHg.  FINDINGS Left Ventricle: Left ventricular ejection fraction, by estimation, is 60 to 65%. The left ventricle has normal function. The left ventricle has no regional wall motion abnormalities. The average left ventricular global longitudinal strain is -20.4 %. The global longitudinal strain is normal. The left ventricular internal cavity size was normal in size. There is borderline left ventricular hypertrophy. Left ventricular diastolic parameters were normal. The ratio of pulmonic flow to systemic flow (Qp/Qs ratio) is 1.10.  Right Ventricle: Qp/Qs 1.1. The right ventricular size is normal. No increase in right ventricular wall thickness. Right ventricular systolic function is normal. There is normal pulmonary artery systolic pressure. The tricuspid regurgitant velocity is 2.08 m/s, and with an assumed right atrial pressure of 3 mmHg, the estimated right ventricular systolic pressure is 20.3 mmHg.  Left Atrium: Left atrial size was normal in size.  Right Atrium: Right atrial size was normal in size.  Pericardium: There is no evidence of pericardial effusion.  Mitral Valve: The mitral valve is normal in structure. Trivial mitral valve regurgitation. No evidence of mitral valve stenosis.  Tricuspid Valve: The tricuspid valve is normal in structure. Tricuspid valve regurgitation is mild . No evidence of tricuspid stenosis.  Aortic Valve: The aortic valve is normal in structure. Aortic valve regurgitation is trivial. Aortic regurgitation PHT measures 621 msec. No aortic stenosis is  present.  Pulmonic Valve: The pulmonic valve was normal in structure. Pulmonic valve regurgitation is trivial. No evidence of pulmonic stenosis.  Aorta: The aortic root is normal  in size and structure.  Venous: The inferior vena cava is normal in size with greater than 50% respiratory variability, suggesting right atrial pressure of 3 mmHg.  IAS/Shunts: No atrial level shunt detected by color flow Doppler. The ratio of pulmonic flow to systemic flow (Qp/Qs ratio) is 1.10.   LEFT VENTRICLE PLAX 2D LVIDd:         4.70 cm   Diastology LVIDs:         2.90 cm   LV e' medial:    8.16 cm/s LV PW:         1.10 cm   LV E/e' medial:  12.5 LV IVS:        1.10 cm   LV e' lateral:   9.79 cm/s LVOT diam:     2.20 cm   LV E/e' lateral: 10.4 LV SV:         77 LV SV Index:   35        2D Longitudinal Strain LVOT Area:     3.80 cm  2D Strain GLS Avg:     -20.4 %   RIGHT VENTRICLE             IVC RV Basal diam:  3.20 cm     IVC diam: 1.80 cm RV S prime:     12.50 cm/s RVOT diam:      2.10 cm TAPSE (M-mode): 2.4 cm  LEFT ATRIUM             Index        RIGHT ATRIUM           Index LA diam:        3.50 cm 1.58 cm/m   RA Area:     17.70 cm LA Vol (A2C):   46.3 ml 20.95 ml/m  RA Volume:   45.20 ml  20.45 ml/m LA Vol (A4C):   45.1 ml 20.41 ml/m LA Biplane Vol: 48.6 ml 21.99 ml/m AORTIC VALVE             PULMONIC VALVE LVOT Vmax:   89.10 cm/s  RVOT Peak grad: 4 mmHg LVOT Vmean:  57.100 cm/s LVOT VTI:    0.203 m AI PHT:      621 msec  AORTA Ao Root diam: 3.40 cm Ao Asc diam:  3.60 cm Ao Desc diam: 2.10 cm  MV E velocity: 102.00 cm/s  TRICUSPID VALVE MV A velocity: 76.00 cm/s   TR Peak grad:   17.3 mmHg MV E/A ratio:  1.34         TR Vmax:        208.00 cm/s  SHUNTS Systemic VTI:  0.20 m Systemic Diam: 2.20 cm Pulmonic VTI:  0.255 m Pulmonic Diam: 2.10 cm Qp/Qs:         1.14  Gypsy Balsam MD Electronically signed by Gypsy Balsam MD Signature Date/Time: 04/28/2022/11:38:51  AM    Final    MONITORS  LONG TERM MONITOR (3-14 DAYS) 05/02/2022  Narrative Patch Wear Time:  8 days and 10 hours (2024-04-04T10:56:05-398 to 2024-04-12T21:36:51-0400)  Patient had a min HR of 45 bpm, max HR of 132 bpm, and avg HR of 69 bpm. Predominant underlying rhythm was Sinus Rhythm.  No pauses of 3 seconds or greater no episodes of second or third-degree AV nodal block.  There were 2 triggered and 4 diary events all representing sinus rhythm sinus tachycardia rate 66 to 109 bpm 1 episode had a single atrial premature contraction.  Isolated SVEs were  rare (<1.0%), SVE Couplets were rare (<1.0%), and SVE Triplets were rare (<1.0%).  There were no episodes of atrial fibrillation or flutter.  Isolated VEs were rare (<1.0%), and no VE Couplets or VE Triplets were present.             Physical Exam:    VS:  BP 126/82   Pulse 76   Ht 6' (1.829 m)   Wt 216 lb 3.2 oz (98.1 kg)   SpO2 96%   BMI 29.32 kg/m     Wt Readings from Last 3 Encounters:  06/11/22 216 lb 3.2 oz (98.1 kg)  04/15/22 218 lb (98.9 kg)  11/21/20 215 lb 3.2 oz (97.6 kg)     GEN:  Well nourished, well developed in no acute distress HEENT: Normal NECK: No JVD; No carotid bruits LYMPHATICS: No lymphadenopathy CARDIAC: RRR, no murmurs, rubs, gallops RESPIRATORY:  Clear to auscultation without rales, wheezing or rhonchi  ABDOMEN: Soft, non-tender, non-distended MUSCULOSKELETAL:  No edema; No deformity  SKIN: Warm and dry NEUROLOGIC:  Alert and oriented x 3 PSYCHIATRIC:  Normal affect    Signed, Norman Herrlich, MD  06/28/2022 10:40 AM    [facility]

## 2022-06-11 ENCOUNTER — Encounter: Payer: Self-pay | Admitting: Cardiology

## 2022-06-11 ENCOUNTER — Ambulatory Visit: Payer: 59 | Attending: Cardiology | Admitting: Cardiology

## 2022-06-11 VITALS — BP 126/82 | HR 76 | Ht 72.0 in | Wt 216.2 lb

## 2022-06-11 DIAGNOSIS — G909 Disorder of the autonomic nervous system, unspecified: Secondary | ICD-10-CM

## 2022-06-11 DIAGNOSIS — I4510 Unspecified right bundle-branch block: Secondary | ICD-10-CM

## 2022-06-11 DIAGNOSIS — Q263 Partial anomalous pulmonary venous connection: Secondary | ICD-10-CM | POA: Diagnosis not present

## 2022-06-11 NOTE — Patient Instructions (Signed)
Medication Instructions:  Your physician recommends that you continue on your current medications as directed. Please refer to the Current Medication list given to you today.  *If you need a refill on your cardiac medications before your next appointment, please call your pharmacy*   Lab Work: None If you have labs (blood work) drawn today and your tests are completely normal, you will receive your results only by: MyChart Message (if you have MyChart) OR A paper copy in the mail If you have any lab test that is abnormal or we need to change your treatment, we will call you to review the results.   Testing/Procedures: None   Follow-Up: At [facility], you and your health needs are our priority.  As part of our continuing mission to provide you with exceptional heart care, we have created designated Provider Care Teams.  These Care Teams include your primary Cardiologist (physician) and Advanced Practice Providers (APPs -  Physician Assistants and Nurse Practitioners) who all work together to provide you with the care you need, when you need it.  We recommend signing up for the patient portal called "MyChart".  Sign up information is provided on this After Visit Summary.  MyChart is used to connect with patients for Virtual Visits (Telemedicine).  Patients are able to view lab/test results, encounter notes, upcoming appointments, etc.  Non-urgent messages can be sent to your provider as well.   To learn more about what you can do with MyChart, go to [email or web address].    Your next appointment:   1 year(s)  Provider:   Brian Munley, MD    Other Instructions None
